# Patient Record
Sex: Female | Born: 1970 | Race: White | Hispanic: Yes | Marital: Single | Smoking: Never smoker
Health system: Southern US, Community
[De-identification: ages and names within clinical notes are randomized; demographics above are authoritative.]

## PROBLEM LIST (undated history)

## (undated) DIAGNOSIS — F329 Major depressive disorder, single episode, unspecified: Secondary | ICD-10-CM

## (undated) DIAGNOSIS — E785 Hyperlipidemia, unspecified: Secondary | ICD-10-CM

## (undated) DIAGNOSIS — K219 Gastro-esophageal reflux disease without esophagitis: Secondary | ICD-10-CM

## (undated) DIAGNOSIS — Z9889 Other specified postprocedural states: Secondary | ICD-10-CM

## (undated) DIAGNOSIS — T4145XA Adverse effect of unspecified anesthetic, initial encounter: Secondary | ICD-10-CM

## (undated) DIAGNOSIS — E079 Disorder of thyroid, unspecified: Secondary | ICD-10-CM

## (undated) DIAGNOSIS — I82409 Acute embolism and thrombosis of unspecified deep veins of unspecified lower extremity: Secondary | ICD-10-CM

## (undated) DIAGNOSIS — E119 Type 2 diabetes mellitus without complications: Secondary | ICD-10-CM

## (undated) DIAGNOSIS — Z992 Dependence on renal dialysis: Secondary | ICD-10-CM

## (undated) DIAGNOSIS — N2581 Secondary hyperparathyroidism of renal origin: Secondary | ICD-10-CM

## (undated) DIAGNOSIS — N186 End stage renal disease: Secondary | ICD-10-CM

## (undated) DIAGNOSIS — R112 Nausea with vomiting, unspecified: Secondary | ICD-10-CM

## (undated) DIAGNOSIS — D638 Anemia in other chronic diseases classified elsewhere: Secondary | ICD-10-CM

## (undated) DIAGNOSIS — I4891 Unspecified atrial fibrillation: Secondary | ICD-10-CM

## (undated) DIAGNOSIS — R569 Unspecified convulsions: Secondary | ICD-10-CM

## (undated) DIAGNOSIS — I10 Essential (primary) hypertension: Secondary | ICD-10-CM

## (undated) DIAGNOSIS — N289 Disorder of kidney and ureter, unspecified: Secondary | ICD-10-CM

## (undated) DIAGNOSIS — F32A Depression, unspecified: Secondary | ICD-10-CM

## (undated) DIAGNOSIS — T8859XA Other complications of anesthesia, initial encounter: Secondary | ICD-10-CM

## (undated) DIAGNOSIS — D649 Anemia, unspecified: Secondary | ICD-10-CM

## (undated) HISTORY — DX: Acute embolism and thrombosis of unspecified deep veins of unspecified lower extremity: I82.409

## (undated) HISTORY — DX: Unspecified atrial fibrillation: I48.91

## (undated) HISTORY — DX: Disorder of thyroid, unspecified: E07.9

## (undated) HISTORY — PX: INSERTION OF DIALYSIS CATHETER: SHX1324

## (undated) HISTORY — DX: Anemia, unspecified: D64.9

## (undated) HISTORY — PX: OTHER SURGICAL HISTORY: SHX169

---

## 2011-11-03 ENCOUNTER — Encounter: Payer: Self-pay | Admitting: Emergency Medicine

## 2011-11-03 ENCOUNTER — Emergency Department (HOSPITAL_COMMUNITY)
Admission: EM | Admit: 2011-11-03 | Discharge: 2011-11-03 | Discharge status: Against Medical Advice | Payer: Self-pay | Attending: Emergency Medicine | Admitting: Emergency Medicine

## 2011-11-03 DIAGNOSIS — M7989 Other specified soft tissue disorders: Secondary | ICD-10-CM | POA: Insufficient documentation

## 2011-11-03 HISTORY — DX: Essential (primary) hypertension: I10

## 2011-11-03 NOTE — ED Notes (Signed)
PT. REPORTS PROGRESSING BILATERAL FEET SWELLING / ABDOMINAL SWELLING WITH FEET PAIN FOR 1 WEEK ,  NO SOB ,DENIES INJURY OR FALL.

## 2013-03-04 ENCOUNTER — Encounter (HOSPITAL_COMMUNITY): Payer: Self-pay | Admitting: Adult Health

## 2013-03-04 ENCOUNTER — Emergency Department (HOSPITAL_COMMUNITY)
Admission: EM | Admit: 2013-03-04 | Discharge: 2013-03-05 | Disposition: A | Discharge status: Home / Self Care | Payer: Self-pay | Attending: Emergency Medicine | Admitting: Emergency Medicine

## 2013-03-04 DIAGNOSIS — E119 Type 2 diabetes mellitus without complications: Secondary | ICD-10-CM | POA: Insufficient documentation

## 2013-03-04 DIAGNOSIS — I120 Hypertensive chronic kidney disease with stage 5 chronic kidney disease or end stage renal disease: Secondary | ICD-10-CM | POA: Insufficient documentation

## 2013-03-04 DIAGNOSIS — N186 End stage renal disease: Secondary | ICD-10-CM | POA: Insufficient documentation

## 2013-03-04 DIAGNOSIS — Z79899 Other long term (current) drug therapy: Secondary | ICD-10-CM | POA: Insufficient documentation

## 2013-03-04 HISTORY — DX: Disorder of kidney and ureter, unspecified: N28.9

## 2013-03-04 LAB — COMPREHENSIVE METABOLIC PANEL
Alkaline Phosphatase: 94 U/L (ref 39–117)
BUN: 29 mg/dL — ABNORMAL HIGH (ref 6–23)
Chloride: 90 mEq/L — ABNORMAL LOW (ref 96–112)
Creatinine, Ser: 6.95 mg/dL — ABNORMAL HIGH (ref 0.50–1.10)
GFR calc Af Amer: 8 mL/min — ABNORMAL LOW (ref >90)
Glucose, Bld: 203 mg/dL — ABNORMAL HIGH (ref 70–99)
Potassium: 4.1 mEq/L (ref 3.5–5.1)
Total Bilirubin: 0.2 mg/dL — ABNORMAL LOW (ref 0.3–1.2)

## 2013-03-04 LAB — CBC WITH DIFFERENTIAL/PLATELET
Eosinophils Absolute: 0.5 10*3/uL (ref 0.0–0.7)
HCT: 31.5 % — ABNORMAL LOW (ref 36.0–46.0)
Hemoglobin: 10.4 g/dL — ABNORMAL LOW (ref 12.0–15.0)
Lymphs Abs: 2 10*3/uL (ref 0.7–4.0)
MCH: 28 pg (ref 26.0–34.0)
MCHC: 33 g/dL (ref 30.0–36.0)
Monocytes Absolute: 1.9 10*3/uL — ABNORMAL HIGH (ref 0.1–1.0)
Monocytes Relative: 12 % (ref 3–12)
Neutro Abs: 10.6 10*3/uL — ABNORMAL HIGH (ref 1.7–7.7)
Neutrophils Relative %: 70 % (ref 43–77)
RBC: 3.72 MIL/uL — ABNORMAL LOW (ref 3.87–5.11)

## 2013-03-04 NOTE — ED Notes (Signed)
RN on phone with pharmacy about delay in insulin being sent.

## 2013-03-04 NOTE — ED Notes (Signed)
Pt is spanish speaking speaking. Daughter at bedside. Pt has no complaints. Daughter is caregiver and reports, "I am concerned because I gave her BP medication, carvedilol 25 mg and she is supposed to have dialysis today but we missed dialysisi due to scheduling errors. She was just released from the hospital in [place] and we just got here at 6 pm today. They scheduled her dialysis for today at 10:30 and meeting her nephrologist, but we were not back from [place] yet. I am concerned because she missed dialysis today and I gavve her BP medication. [facility] told me to come here with her." Dialysis port in upper right chest. This is a new Dx for pt and family and has only been dialyzed 4-5 times in the hospital.

## 2013-03-04 NOTE — ED Notes (Signed)
Per Dr. Effie Shy, have pharmacy send vial of humalog sent from pharmacy for pt. RN placed call to pharmacy.

## 2013-03-05 NOTE — ED Notes (Signed)
Pt's niece provided with insulin teaching and insulin syringes for home. Pt able to demonstrate correctly. Pt has sliding scale from PCP that she will follow at home. Follow up information provided.

## 2013-03-05 NOTE — ED Provider Notes (Signed)
History     CSN: [id number]  Arrival date & time 03/04/13  1839   First MD Initiated Contact with Patient 03/04/13 2108      Chief Complaint  Patient presents with  . dialysis     (Consider location/radiation/quality/duration/timing/severity/associated sxs/prior treatment) HPI Comments: Natalie [name] is a 42 y.o. Female who is here to be evaluated because she missed her dialysis appointment today. She is transferring from [facility], to [place] for dialysis treatment. She was last dialyzed 2 days ago. She is due to followup for a renal physician appointment in 2 days. There's been no fever, chills, nausea, vomiting, weakness, or dizziness. Her niece is with her and acts as her Nurse, learning disability. She has not taken her insulin because she cannot afford the prescription. No other known modifying factors.  The history is provided by the patient.    Past Medical History  Diagnosis Date  . Diabetes mellitus   . Hypertension   . Renal disorder     History reviewed. No pertinent past surgical history.  History reviewed. No pertinent family history.  History  Substance Use Topics  . Smoking status: Never Smoker   . Smokeless tobacco: Not on file  . Alcohol Use: No    OB History   Grav Para Term Preterm Abortions TAB SAB Ect Mult Living                  Review of Systems  All other systems reviewed and are negative.    Allergies  Review of patient's allergies indicates no known allergies.  Home Medications   Current Outpatient Rx  Name  Route  Sig  Dispense  Refill  . amLODipine (NORVASC) 10 MG tablet   Oral   Take 10 mg by mouth daily.         . calcium acetate (PHOSLO) 667 MG capsule   Oral   Take 667 mg by mouth 3 (three) times daily with meals.         . carvedilol (COREG) 25 MG tablet   Oral   Take 25 mg by mouth 2 (two) times daily with a meal.         . docusate sodium (COLACE) 100 MG capsule   Oral   Take 100 mg by mouth daily.         . sodium bicarbonate 650 MG tablet   Oral   Take 1,300 mg by mouth 2 (two) times daily.           BP 163/88  Pulse 66  Temp(Src) 98.4 F (36.9 C) (Oral)  Resp 16  SpO2 98%  Physical Exam  Nursing note and vitals reviewed. Constitutional: She is oriented to person, place, and time. She appears well-developed and well-nourished. No distress.  HENT:  Head: Normocephalic and atraumatic.  Eyes: Conjunctivae and EOM are normal. Pupils are equal, round, and reactive to light.  Neck: Normal range of motion and phonation normal. Neck supple.  Cardiovascular: Normal rate, regular rhythm and intact distal pulses.   Right upper chest dialysis catheter, in place; site appears normal without redness, erythema, or discharge  Pulmonary/Chest: Effort normal and breath sounds normal. She exhibits no tenderness.  Abdominal: Soft. She exhibits no distension. There is no tenderness. There is no guarding.  Musculoskeletal: Normal range of motion.  Neurological: She is alert and oriented to person, place, and time. She has normal strength. She exhibits normal muscle tone.  Skin: Skin is warm and dry.  Psychiatric: Her behavior is normal.  ED Course  Procedures (including critical care time)   Consultation- I discussed the case with Dr. Hyman Hopes; he arranged followup tomorrow (03/05/13) morning at 10:30 AM. At that point, a decision will be made about dialysis, that day, or the next.  A vial of Humalog insulin was obtained to give to the patient  Labs Reviewed  CBC WITH DIFFERENTIAL - Abnormal; Notable for the following:    WBC 15.0 (*)    RBC 3.72 (*)    Hemoglobin 10.4 (*)    HCT 31.5 (*)    RDW 15.6 (*)    Neutro Abs 10.6 (*)    Monocytes Absolute 1.9 (*)    All other components within normal limits  COMPREHENSIVE METABOLIC PANEL - Abnormal; Notable for the following:    Sodium 131 (*)    Chloride 90 (*)    Glucose, Bld 203 (*)    BUN 29 (*)    Creatinine, Ser 6.95 (*)    Albumin  3.3 (*)    Total Bilirubin 0.2 (*)    GFR calc non Af Amer 7 (*)    GFR calc Af Amer 8 (*)    All other components within normal limits      1. End stage renal disease       MDM  End-stage renal disease, with fairly euvolemic status. Potassium normal. Oxygenation normal. There is no indication for urgent dialysis.    Nursing Notes Reviewed/ Care Coordinated, and agree without changes. Applicable Imaging Reviewed.  Interpretation of Laboratory Data incorporated into ED treatment   Plan: Home Medications- usual; Home Treatments- minimal oral fluids; Recommended follow up- Renal at 10:30 AM on 03/05/13      Flint Melter, MD 03/05/13 0020

## 2013-03-11 ENCOUNTER — Other Ambulatory Visit: Payer: Self-pay | Admitting: *Deleted

## 2013-03-11 DIAGNOSIS — N186 End stage renal disease: Secondary | ICD-10-CM

## 2013-03-13 ENCOUNTER — Ambulatory Visit: Payer: Self-pay | Admitting: Vascular Surgery

## 2013-03-17 ENCOUNTER — Ambulatory Visit: Payer: Self-pay | Admitting: Surgery

## 2013-04-18 ENCOUNTER — Encounter: Payer: Self-pay | Admitting: Vascular Surgery

## 2013-04-18 ENCOUNTER — Other Ambulatory Visit: Payer: Self-pay

## 2013-04-18 DIAGNOSIS — N186 End stage renal disease: Secondary | ICD-10-CM

## 2013-04-18 DIAGNOSIS — Z01810 Encounter for preprocedural cardiovascular examination: Secondary | ICD-10-CM

## 2013-05-15 ENCOUNTER — Encounter: Payer: Self-pay | Admitting: Vascular Surgery

## 2013-05-16 ENCOUNTER — Ambulatory Visit (INDEPENDENT_AMBULATORY_CARE_PROVIDER_SITE_OTHER): Payer: Self-pay | Admitting: Vascular Surgery

## 2013-05-16 ENCOUNTER — Encounter: Payer: Self-pay | Admitting: Vascular Surgery

## 2013-05-16 ENCOUNTER — Encounter (INDEPENDENT_AMBULATORY_CARE_PROVIDER_SITE_OTHER): Payer: Self-pay | Admitting: *Deleted

## 2013-05-16 VITALS — BP 161/84 | HR 56 | Wt 121.2 lb

## 2013-05-16 DIAGNOSIS — N186 End stage renal disease: Secondary | ICD-10-CM

## 2013-05-16 DIAGNOSIS — Z992 Dependence on renal dialysis: Secondary | ICD-10-CM | POA: Insufficient documentation

## 2013-05-16 DIAGNOSIS — Z01810 Encounter for preprocedural cardiovascular examination: Secondary | ICD-10-CM

## 2013-05-16 NOTE — Progress Notes (Signed)
[facility] [place] HISTORY AND PHYSICAL   CC: ESRD on HD T TH Sat at [facility] facility Referring Physician: Dr. Ruben Reason  History of Present Illness: Natalie [name] is a 42 y.o. female  Of hispanic origin who has PMH of DM, HTN , a.fib who was referred to [place] for placement of Permanent HD access.   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 Hypertension   . Renal disorder     ESRD  . Diabetes mellitus     Type II  . Anemia   . Thyroid disease     Hyperpara-thyroidism  . Atrial fibrillation   . DVT (deep venous thrombosis)    History reviewed. No pertinent past surgical history.   Social History History  Substance Use Topics  . Smoking status: Never Smoker   . Smokeless tobacco: Never Used  . Alcohol Use: No    Family History Family History  Problem Relation Age of Onset  . Heart attack Mother   . Diabetes Sister     No Known Allergies  Current Outpatient Prescriptions  Medication Sig Dispense Refill  . amLODipine (NORVASC) 10 MG tablet Take 10 mg by mouth daily.      . calcium acetate (PHOSLO) 667 MG capsule Take 667 mg by mouth 3 (three) times daily with meals.      . carvedilol (COREG) 25 MG tablet Take 25 mg by mouth 2 (two) times daily with a meal.      . docusate sodium (COLACE) 100 MG capsule Take 100 mg by mouth daily.      . sodium bicarbonate 650 MG tablet Take 1,300 mg by mouth 2 (two) times daily.       No current facility-administered medications for this visit.   ROS: [x]  Positive   [ ]  Denies    General: [ ]  Weight loss, [ ]  Fever, [ ]  chills Neurologic: [ ]  Dizziness, [ ]  Blackouts, [ ]  Seizure [ ]  Stroke, [ ]  "Mini stroke", [ ]  Slurred speech, [ ]  Temporary blindness; [ ]  weakness in arms or legs, [ ]  Hoarseness Cardiac: [ ]  Chest pain/pressure, [ ]  Shortness of breath at rest [ x] Shortness of breath with exertion, [ x] Atrial fibrillation or irregular  heartbeat Vascular: [ ]  Pain in legs with walking, [ ]  Pain in legs at rest, [ ]  Pain in legs at night,  [ ]  Non-healing ulcer, [ ]  Blood clot in vein/DVT,   Pulmonary: [ ]  Home oxygen, [ ]  Productive cough, [ ]  Coughing up blood, [ ]  Asthma,  [ ]  Wheezing Musculoskeletal:  [ ]  Arthritis, [ ]  Low back pain, [ ]  Joint pain Hematologic: [ ]  Easy Bruising, [ ]  Anemia; [ ]  Hepatitis Gastrointestinal: [ ]  Blood in stool, [ ]  Gastroesophageal Reflux/heartburn, [ ]  Trouble swallowing Urinary: [x ] chronic Kidney disease, [ x] on HD - [ ]  MWF or [x ] TTHS, [ ]  Burning with urination, [ ]  Difficulty urinating Skin: [ ]  Rashes, [ ]  Wounds Psychological: [ ]  Anxiety, [ ]  Depression  Physical Examination  Filed Vitals:   05/16/13 1549  BP: 161/84  Pulse: 56    There is no height on file to calculate BMI.  General:  WDWN in NAD Gait: Normal HENT: WNL Eyes: Pupils equal Pulmonary: normal non-labored breathing , without Rales, rhonchi,  wheezing Cardiac: RRR, without  Murmurs, rubs or gallops; Abdomen: soft,  Skin:  no rashes, ulcers noted Vascular Exam/Pulses: 2+ radila pulses palp bilat Extremities without ischemic changes, no Gangrene , no cellulitis; no open wounds;  Musculoskeletal: no muscle wasting or atrophy  Neurologic: A&O X 3; Appropriate Affect ; SENSATION: normal; MOTOR FUNCTION:  moving all extremities equally. Speech is fluent/normal Psych: judgment intact, mood and affect appropriate for clinical situation Lymph: no LAD in inguinal or axillary basin  Non-Invasive Vascular Imaging: 05/16/2013 Vein mapping shows adequate vein in right cephalic, bilat basilic veins  ASSESSMENT: Natalie [name] is a 42 y.o. female Who is on HD and needs permanent access All history obtained and questions answered through interpreter PLAN: AVF - right radiocephalic  Addendum  I have independently interviewed and examined the patient, and I agree with the physician assistant's findings.  I  discussed that her best first fistula option would be a R RC AVF.  She has a strong radial pulse and visible cephalic vein.  Risk, benefits, and alternatives to access surgery were discussed.  The patient is aware the risks include but are not limited to: bleeding, infection, steal syndrome, nerve damage, ischemic monomelic neuropathy, failure to mature, need for additional procedures, death and stroke.  The patient agrees to proceed forward with the procedure.  This is scheduled for 28 JUL 14.   Leonides Sake, MD [facility] [place] Office: [phone] Pager: [phone]  05/16/2013, 5:16 PM

## 2013-05-19 ENCOUNTER — Other Ambulatory Visit: Payer: Self-pay

## 2013-05-23 NOTE — Progress Notes (Signed)
Unable to contact pt. According to pt sister pt will only be available on Saturday because she is at work. Pre-op instructions were left on voicemail by interpreter.

## 2013-05-23 NOTE — Progress Notes (Addendum)
Message left on pt voicemail  regarding a new arrival time of 5:30AM on Monday, May 26, 2013. Interpreter # [phone] left the message. A message was left on  Lawanna's voicemail ( # [phone])  to change interpreter schedule to arrive at 5:30 AM as well.Spoke with Tonia Ghent, she suggests that staff uses the language line until the interpreter that was originally scheduled arrives because there will be no interpreters available at 5:30 AM.

## 2013-05-25 MED ORDER — DEXTROSE 5 % IV SOLN
1.5000 g | INTRAVENOUS | Status: AC
  Administered 2013-05-26: 1.5 g via INTRAVENOUS
  Filled 2013-05-25 (×2): qty 1.5

## 2013-05-26 ENCOUNTER — Encounter (HOSPITAL_COMMUNITY)
Admission: RE | Disposition: A | Discharge status: Home / Self Care | Payer: Self-pay | Source: Ambulatory Visit | Attending: Vascular Surgery

## 2013-05-26 ENCOUNTER — Ambulatory Visit (HOSPITAL_COMMUNITY): Payer: Self-pay

## 2013-05-26 ENCOUNTER — Encounter (HOSPITAL_COMMUNITY): Payer: Self-pay | Admitting: Critical Care Medicine

## 2013-05-26 ENCOUNTER — Ambulatory Visit (HOSPITAL_COMMUNITY)
Admission: RE | Admit: 2013-05-26 | Discharge: 2013-05-26 | Disposition: A | Discharge status: Home / Self Care | Payer: MEDICAID | Source: Ambulatory Visit | Attending: Vascular Surgery | Admitting: Vascular Surgery

## 2013-05-26 ENCOUNTER — Telehealth: Payer: Self-pay | Admitting: Vascular Surgery

## 2013-05-26 ENCOUNTER — Ambulatory Visit (HOSPITAL_COMMUNITY): Payer: Self-pay | Admitting: Critical Care Medicine

## 2013-05-26 DIAGNOSIS — Z79899 Other long term (current) drug therapy: Secondary | ICD-10-CM | POA: Insufficient documentation

## 2013-05-26 DIAGNOSIS — I120 Hypertensive chronic kidney disease with stage 5 chronic kidney disease or end stage renal disease: Secondary | ICD-10-CM | POA: Insufficient documentation

## 2013-05-26 DIAGNOSIS — E119 Type 2 diabetes mellitus without complications: Secondary | ICD-10-CM | POA: Insufficient documentation

## 2013-05-26 DIAGNOSIS — N186 End stage renal disease: Secondary | ICD-10-CM

## 2013-05-26 DIAGNOSIS — E213 Hyperparathyroidism, unspecified: Secondary | ICD-10-CM | POA: Insufficient documentation

## 2013-05-26 DIAGNOSIS — I4891 Unspecified atrial fibrillation: Secondary | ICD-10-CM | POA: Insufficient documentation

## 2013-05-26 DIAGNOSIS — Z794 Long term (current) use of insulin: Secondary | ICD-10-CM | POA: Insufficient documentation

## 2013-05-26 DIAGNOSIS — D649 Anemia, unspecified: Secondary | ICD-10-CM | POA: Insufficient documentation

## 2013-05-26 DIAGNOSIS — G5690 Unspecified mononeuropathy of unspecified upper limb: Secondary | ICD-10-CM | POA: Insufficient documentation

## 2013-05-26 DIAGNOSIS — Z86718 Personal history of other venous thrombosis and embolism: Secondary | ICD-10-CM | POA: Insufficient documentation

## 2013-05-26 HISTORY — PX: AV FISTULA PLACEMENT: SHX1204

## 2013-05-26 LAB — POCT I-STAT 4, (NA,K, GLUC, HGB,HCT)
Hemoglobin: 13.6 g/dL (ref 12.0–15.0)
Potassium: 3.7 mEq/L (ref 3.5–5.1)
Sodium: 136 mEq/L (ref 135–145)

## 2013-05-26 SURGERY — ARTERIOVENOUS (AV) FISTULA CREATION
Anesthesia: General | Site: Arm Upper | Laterality: Right | Wound class: Clean

## 2013-05-26 MED ORDER — OXYCODONE HCL 5 MG PO TABS: 5.0000 mg | ORAL_TABLET | Freq: Once | ORAL | Status: AC | PRN

## 2013-05-26 MED ORDER — CARVEDILOL 12.5 MG PO TABS
ORAL_TABLET | ORAL | Status: AC
  Filled 2013-05-26: qty 2

## 2013-05-26 MED ORDER — SODIUM CHLORIDE 0.9 % IV SOLN
INTRAVENOUS | Status: DC | PRN
  Administered 2013-05-26: 07:00:00 via INTRAVENOUS

## 2013-05-26 MED ORDER — PHENYLEPHRINE HCL 10 MG/ML IJ SOLN
INTRAMUSCULAR | Status: DC | PRN
  Administered 2013-05-26: 80 ug via INTRAVENOUS

## 2013-05-26 MED ORDER — OXYCODONE HCL 5 MG/5ML PO SOLN: 5.0000 mg | Freq: Once | ORAL | Status: AC | PRN

## 2013-05-26 MED ORDER — THROMBIN 20000 UNITS EX SOLR
CUTANEOUS | Status: AC
  Filled 2013-05-26: qty 20000

## 2013-05-26 MED ORDER — LIDOCAINE HCL (CARDIAC) 20 MG/ML IV SOLN
INTRAVENOUS | Status: DC | PRN
  Administered 2013-05-26: 40 mg via INTRAVENOUS

## 2013-05-26 MED ORDER — SODIUM CHLORIDE 0.9 % IR SOLN
Status: DC | PRN
  Administered 2013-05-26: 08:00:00

## 2013-05-26 MED ORDER — PROPOFOL 10 MG/ML IV BOLUS
INTRAVENOUS | Status: DC | PRN
  Administered 2013-05-26: 50 mg via INTRAVENOUS
  Administered 2013-05-26: 150 mg via INTRAVENOUS

## 2013-05-26 MED ORDER — METOCLOPRAMIDE HCL 5 MG/ML IJ SOLN: 10.0000 mg | Freq: Once | INTRAMUSCULAR | Status: DC | PRN

## 2013-05-26 MED ORDER — OXYCODONE HCL 5 MG PO TABS
ORAL_TABLET | ORAL | Status: AC
  Administered 2013-05-26: 5 mg via ORAL
  Filled 2013-05-26: qty 1

## 2013-05-26 MED ORDER — MIDAZOLAM HCL 5 MG/5ML IJ SOLN
INTRAMUSCULAR | Status: DC | PRN
  Administered 2013-05-26: 1 mg via INTRAVENOUS

## 2013-05-26 MED ORDER — ONDANSETRON HCL 4 MG/2ML IJ SOLN
INTRAMUSCULAR | Status: DC | PRN
  Administered 2013-05-26: 4 mg via INTRAVENOUS

## 2013-05-26 MED ORDER — FENTANYL CITRATE 0.05 MG/ML IJ SOLN
25.0000 ug | INTRAMUSCULAR | Status: DC | PRN
  Administered 2013-05-26: 25 ug via INTRAVENOUS

## 2013-05-26 MED ORDER — FENTANYL CITRATE 0.05 MG/ML IJ SOLN
INTRAMUSCULAR | Status: DC | PRN
  Administered 2013-05-26 (×4): 25 ug via INTRAVENOUS

## 2013-05-26 MED ORDER — OXYCODONE HCL 5 MG PO TABS: 5.0000 mg | ORAL_TABLET | ORAL | Status: DC | PRN

## 2013-05-26 MED ORDER — FENTANYL CITRATE 0.05 MG/ML IJ SOLN
INTRAMUSCULAR | Status: AC
  Administered 2013-05-26: 25 ug via INTRAVENOUS
  Filled 2013-05-26: qty 2

## 2013-05-26 MED ORDER — SODIUM CHLORIDE 0.9 % IV SOLN: INTRAVENOUS | Status: DC

## 2013-05-26 MED ORDER — 0.9 % SODIUM CHLORIDE (POUR BTL) OPTIME
TOPICAL | Status: DC | PRN
  Administered 2013-05-26: 1000 mL

## 2013-05-26 SURGICAL SUPPLY — 39 items
ARMBAND PINK RESTRICT EXTREMIT (MISCELLANEOUS) ×2 IMPLANT
CANISTER SUCTION 2500CC (MISCELLANEOUS) ×2 IMPLANT
CLIP TI MEDIUM 6 (CLIP) ×2 IMPLANT
CLIP TI WIDE RED SMALL 6 (CLIP) ×2 IMPLANT
CLOTH BEACON ORANGE TIMEOUT ST (SAFETY) ×2 IMPLANT
COVER PROBE W GEL 5X96 (DRAPES) ×2 IMPLANT
COVER SURGICAL LIGHT HANDLE (MISCELLANEOUS) ×2 IMPLANT
DECANTER SPIKE VIAL GLASS SM (MISCELLANEOUS) IMPLANT
DERMABOND ADVANCED (GAUZE/BANDAGES/DRESSINGS) ×1
DERMABOND ADVANCED .7 DNX12 (GAUZE/BANDAGES/DRESSINGS) ×1 IMPLANT
ELECT REM PT RETURN 9FT ADLT (ELECTROSURGICAL) ×2
ELECTRODE REM PT RTRN 9FT ADLT (ELECTROSURGICAL) ×1 IMPLANT
GLOVE BIO SURGEON STRL SZ7 (GLOVE) ×2 IMPLANT
GLOVE BIOGEL PI IND STRL 6.5 (GLOVE) ×2 IMPLANT
GLOVE BIOGEL PI IND STRL 7.0 (GLOVE) ×2 IMPLANT
GLOVE BIOGEL PI IND STRL 7.5 (GLOVE) ×1 IMPLANT
GLOVE BIOGEL PI INDICATOR 6.5 (GLOVE) ×2
GLOVE BIOGEL PI INDICATOR 7.0 (GLOVE) ×2
GLOVE BIOGEL PI INDICATOR 7.5 (GLOVE) ×1
GLOVE ECLIPSE 6.5 STRL STRAW (GLOVE) ×6 IMPLANT
GLOVE SURG SS PI 6.0 STRL IVOR (GLOVE) ×2 IMPLANT
GOWN PREVENTION PLUS XXLARGE (GOWN DISPOSABLE) ×4 IMPLANT
GOWN STRL NON-REIN LRG LVL3 (GOWN DISPOSABLE) ×4 IMPLANT
KIT BASIN OR (CUSTOM PROCEDURE TRAY) ×2 IMPLANT
KIT ROOM TURNOVER OR (KITS) ×2 IMPLANT
NEEDLE HYPO 25GX1X1/2 BEV (NEEDLE) ×2 IMPLANT
NS IRRIG 1000ML POUR BTL (IV SOLUTION) ×2 IMPLANT
PACK CV ACCESS (CUSTOM PROCEDURE TRAY) ×2 IMPLANT
PAD ARMBOARD 7.5X6 YLW CONV (MISCELLANEOUS) ×4 IMPLANT
SPONGE SURGIFOAM ABS GEL 100 (HEMOSTASIS) IMPLANT
SUT MNCRL AB 4-0 PS2 18 (SUTURE) ×2 IMPLANT
SUT PROLENE 6 0 BV (SUTURE) ×2 IMPLANT
SUT PROLENE 7 0 BV 1 (SUTURE) ×2 IMPLANT
SUT VIC AB 3-0 SH 27 (SUTURE) ×2
SUT VIC AB 3-0 SH 27X BRD (SUTURE) ×2 IMPLANT
TOWEL OR 17X24 6PK STRL BLUE (TOWEL DISPOSABLE) ×2 IMPLANT
TOWEL OR 17X26 10 PK STRL BLUE (TOWEL DISPOSABLE) ×2 IMPLANT
UNDERPAD 30X30 INCONTINENT (UNDERPADS AND DIAPERS) ×2 IMPLANT
WATER STERILE IRR 1000ML POUR (IV SOLUTION) ×2 IMPLANT

## 2013-05-26 NOTE — Op Note (Signed)
OPERATIVE NOTE   PROCEDURE: 1. Right brachiocephalic arteriovenous fistula placement 2. Right wrist exploration  PRE-OPERATIVE DIAGNOSIS: end stage renal disease   POST-OPERATIVE DIAGNOSIS: same as above   SURGEON: Leonides Sake, MD  ANESTHESIA: general  ESTIMATED BLOOD LOSS: 50 cc  FINDING(S): 1.  Radial artery only 1.5-2.0 mm 2.  Cephalic vein: 3.0 mm 3.  Brachial artery 2.5-3.0 mm 4.  Weakly palpable thrill with dopplerable radial signal  SPECIMEN(S):  none  INDICATIONS:   Natalie [name] is a 42 y.o. female who presents with end stage renal disease.  The patient is scheduled for right radiocephalic arteriovenous fistula placement.  The patient is aware the risks include but are not limited to: bleeding, infection, steal syndrome, nerve damage, ischemic monomelic neuropathy, failure to mature, and need for additional procedures.  The patient is aware of the risks of the procedure and elects to proceed forward.  DESCRIPTION: After full informed written consent was obtained from the patient, the patient was brought back to the operating room and placed supine upon the operating table.  Prior to induction, the patient received IV antibiotics.   After obtaining adequate anesthesia, the patient was then prepped and draped in the standard fashion for a right arm access procedure.  I turned my attention first to identifying the patient's cephalic vein and radial artery.  Using SonoSite guidance, the location of these vessels were marked out on the skin.   I made a transverse incision and dissected out the radial artery.  Unfortunately, the artery appeared to be only 1.5-2.0 mm in diameter, too small for use for access purposes.  I closed this incision with a running stitch of 3-0 Vicryl in the subcutaneous tissue.  The skin was closed later in the case with a running subcuticular of 4-0 Monocryl.  The skin was cleaned, dried, and reinforced with Dermabond.  I turned my attention to the  antecubitum.  Under Sonosite guidance, I marked out the cephalic vein and brachial artery. I made a transverse incision at the level of the antecubitum and dissected through the subcutaneous tissue and fascia to gain exposure of the brachial artery.  This was noted to be 2.5-3.0 mm in diameter externally.  This was dissected out proximally and distally and controlled with vessel loops .  I then dissected out the cephalic vein.  This was noted to be 2.0-2.5 mm in diameter externally.  The distal segment of the vein was ligated with a  2-0 silk, and the vein was transected.  The proximal segment was iinterrogated with serial dilators.  The vein accepted up to a 3.0 mm dilator without any difficulty.  I then instilled the heparinized saline into the vein and clamped it.  At this point, I reset my exposure of the brachial artery and placed the artery under tension proximally and distally.  I made an arteriotomy with a #11 blade, and then I extended the arteriotomy with a Potts scissor.  I injected heparinized saline proximal and distal to this arteriotomy.  The vein was then sewn to the artery in an end-to-side configuration with a running stitch of 7-0 Prolene.  Prior to completing this anastomosis, I allowed the vein and artery to backbleed.  There was no evidence of clot from any vessels.  I completed the anastomosis in the usual fashion and then released all vessel loops and clamps.  There was a weakly palpable thrill in the venous outflow, and there was a dopplerable radial signal.  At this point, I irrigated out  the surgical wound.  There was no further active bleeding.  The subcutaneous tissue was reapproximated with a running stitch of 3-0 Vicryl.  The skin was then reapproximated with a running subcuticular stitch of 4-0 Vicryl.  The skin was then cleaned, dried, and reinforced with Dermabond.  The patient tolerated this procedure well.   COMPLICATIONS: none  CONDITION: stable  Leonides Sake, MD [facility] [place] Office: [phone] Pager: [phone]  05/26/2013, 9:07 AM

## 2013-05-26 NOTE — Preoperative (Addendum)
Beta Blockers   Reason not to administer Beta Blockers:Not Applicable 

## 2013-05-26 NOTE — Anesthesia Postprocedure Evaluation (Signed)
Anesthesia Post Note  Patient: Natalie [name]  Procedure(s) Performed: Procedure(s) (LRB): Right Brachiocephalic  ARTERIOVENOUS (AV) FISTULA CREATION (Right)  Anesthesia type: General  Patient location: PACU  Post pain: Pain level controlled  Post assessment: Patient's Cardiovascular Status Stable  Last Vitals:  Filed Vitals:   05/26/13 1030  BP: 155/89  Pulse: 65  Temp: 36.2 C  Resp: 15    Post vital signs: Reviewed and stable  Level of consciousness: alert  Complications: No apparent anesthesia complications

## 2013-05-26 NOTE — Interval H&P Note (Signed)
[facility] [place]  History and Physical Update  The patient was interviewed and re-examined.  The patient's previous History and Physical has been reviewed and is unchanged.  There is no change in the plan of care: R RC AVF.  Leonides Sake, MD [facility] [place] Office: [phone] Pager: [phone]  05/26/2013, 7:07 AM

## 2013-05-26 NOTE — Anesthesia Preprocedure Evaluation (Addendum)
Anesthesia Evaluation  Patient identified by MRN, date of birth, ID band Patient awake    Reviewed: Allergy & Precautions, H&P , NPO status , Patient's Chart, lab work & pertinent test results, reviewed documented beta blocker date and time   Airway Mallampati: II TM Distance: >3 FB Neck ROM: full    Dental  (+) Dental Advisory Given   Pulmonary neg pulmonary ROS,  breath sounds clear to auscultation        Cardiovascular hypertension, Pt. on medications + dysrhythmias Atrial Fibrillation Rhythm:regular     Neuro/Psych negative neurological ROS  negative psych ROS   GI/Hepatic negative GI ROS, Neg liver ROS,   Endo/Other  diabetes, Insulin Dependent  Renal/GU ESRFRenal disease  negative genitourinary   Musculoskeletal   Abdominal   Peds  Hematology  (+) anemia ,   Anesthesia Other Findings See surgeon's H&P   Reproductive/Obstetrics negative OB ROS                          Anesthesia Physical Anesthesia Plan  ASA: III  Anesthesia Plan: General   Post-op Pain Management:    Induction: Intravenous  Airway Management Planned: LMA  Additional Equipment:   Intra-op Plan:   Post-operative Plan:   Informed Consent: I have reviewed the patients History and Physical, chart, labs and discussed the procedure including the risks, benefits and alternatives for the proposed anesthesia with the patient or authorized representative who has indicated his/her understanding and acceptance.   Dental advisory given  Plan Discussed with: Anesthesiologist and Surgeon  Anesthesia Plan Comments:        Anesthesia Quick Evaluation

## 2013-05-26 NOTE — Transfer of Care (Signed)
Immediate Anesthesia Transfer of Care Note  Patient: Natalie [name]  Procedure(s) Performed: Procedure(s): Right Brachiocephalic  ARTERIOVENOUS (AV) FISTULA CREATION (Right)  Patient Location: PACU  Anesthesia Type:General  Level of Consciousness: sedated  Airway & Oxygen Therapy: Patient Spontanous Breathing and Patient connected to nasal cannula oxygen  Post-op Assessment: Report given to PACU RN and Post -op Vital signs reviewed and stable  Post vital signs: Reviewed and stable  Complications: No apparent anesthesia complications

## 2013-05-26 NOTE — Telephone Encounter (Addendum)
Message copied by Fredrich Birks on Mon May 26, 2013  1:03 PM ------      Message from: Melene Plan      Created: Mon May 26, 2013  9:55 AM                   ----- Message -----         From: Marlowe Shores, PA-C         Sent: 05/26/2013   8:21 AM           To: Melene Plan, RN, [facility] Admin Pool            4-6 week f/u Dr. Imogene Burn AVF -  ------  Scheduled 07/04/2013 @ 10am  Called home #, the person answering did not speak English, unable to inform of appt by phone. Mailed letter to home address, dpm

## 2013-05-26 NOTE — H&P (View-Only) (Signed)
[facility] [place] HISTORY AND PHYSICAL   CC: ESRD on HD T TH Sat at [facility] facility Referring Physician: Dr. K Goldsborough  History of Present Illness: Natalie [name] is a 42 y.o. female  Of hispanic origin who has PMH of DM, HTN , a.fib who was referred to us for placement of Permanent HD access.   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 Hypertension   . Renal disorder     ESRD  . Diabetes mellitus     Type II  . Anemia   . Thyroid disease     Hyperpara-thyroidism  . Atrial fibrillation   . DVT (deep venous thrombosis)    History reviewed. No pertinent past surgical history.   Social History History  Substance Use Topics  . Smoking status: Never Smoker   . Smokeless tobacco: Never Used  . Alcohol Use: No    Family History Family History  Problem Relation Age of Onset  . Heart attack Mother   . Diabetes Sister     No Known Allergies  Current Outpatient Prescriptions  Medication Sig Dispense Refill  . amLODipine (NORVASC) 10 MG tablet Take 10 mg by mouth daily.      . calcium acetate (PHOSLO) 667 MG capsule Take 667 mg by mouth 3 (three) times daily with meals.      . carvedilol (COREG) 25 MG tablet Take 25 mg by mouth 2 (two) times daily with a meal.      . docusate sodium (COLACE) 100 MG capsule Take 100 mg by mouth daily.      . sodium bicarbonate 650 MG tablet Take 1,300 mg by mouth 2 (two) times daily.       No current facility-administered medications for this visit.   ROS: [x] Positive   [ ] Denies    General: [ ] Weight loss, [ ] Fever, [ ] chills Neurologic: [ ] Dizziness, [ ] Blackouts, [ ] Seizure [ ] Stroke, [ ] "Mini stroke", [ ] Slurred speech, [ ] Temporary blindness; [ ] weakness in arms or legs, [ ] Hoarseness Cardiac: [ ] Chest pain/pressure, [ ] Shortness of breath at rest [ x] Shortness of breath with exertion, [ x] Atrial fibrillation or irregular  heartbeat Vascular: [ ] Pain in legs with walking, [ ] Pain in legs at rest, [ ] Pain in legs at night,  [ ] Non-healing ulcer, [ ] Blood clot in vein/DVT,   Pulmonary: [ ] Home oxygen, [ ] Productive cough, [ ] Coughing up blood, [ ] Asthma,  [ ] Wheezing Musculoskeletal:  [ ] Arthritis, [ ] Low back pain, [ ] Joint pain Hematologic: [ ] Easy Bruising, [ ] Anemia; [ ] Hepatitis Gastrointestinal: [ ] Blood in stool, [ ] Gastroesophageal Reflux/heartburn, [ ] Trouble swallowing Urinary: [x ] chronic Kidney disease, [ x] on HD - [ ] MWF or [x ] TTHS, [ ] Burning with urination, [ ] Difficulty urinating Skin: [ ] Rashes, [ ] Wounds Psychological: [ ] Anxiety, [ ] Depression  Physical Examination  Filed Vitals:   05/16/13 1549  BP: 161/84  Pulse: 56    There is no height on file to calculate BMI.  General:  WDWN in NAD Gait: Normal HENT: WNL Eyes: Pupils equal Pulmonary: normal non-labored breathing , without Rales, rhonchi,  wheezing Cardiac: RRR, without  Murmurs, rubs or gallops; Abdomen: soft,  Skin:   no rashes, ulcers noted Vascular Exam/Pulses: 2+ radila pulses palp bilat Extremities without ischemic changes, no Gangrene , no cellulitis; no open wounds;  Musculoskeletal: no muscle wasting or atrophy  Neurologic: A&O X 3; Appropriate Affect ; SENSATION: normal; MOTOR FUNCTION:  moving all extremities equally. Speech is fluent/normal Psych: judgment intact, mood and affect appropriate for clinical situation Lymph: no LAD in inguinal or axillary basin  Non-Invasive Vascular Imaging: 05/16/2013 Vein mapping shows adequate vein in right cephalic, bilat basilic veins  ASSESSMENT: Natalie [name] is a 42 y.o. female Who is on HD and needs permanent access All history obtained and questions answered through interpreter PLAN: AVF - right radiocephalic  Addendum  I have independently interviewed and examined the patient, and I agree with the physician assistant's findings.  I  discussed that her best first fistula option would be a R RC AVF.  She has a strong radial pulse and visible cephalic vein.  Risk, benefits, and alternatives to access surgery were discussed.  The patient is aware the risks include but are not limited to: bleeding, infection, steal syndrome, nerve damage, ischemic monomelic neuropathy, failure to mature, need for additional procedures, death and stroke.  The patient agrees to proceed forward with the procedure.  This is scheduled for 28 JUL 14.   Brian Chen, MD [facility] [place] Office: [phone] Pager: [phone]  05/16/2013, 5:16 PM   

## 2013-05-26 NOTE — Anesthesia Procedure Notes (Signed)
Procedure Name: LMA Insertion Date/Time: 05/26/2013 7:33 AM Performed by: Elon Alas Pre-anesthesia Checklist: Patient identified, Timeout performed, Emergency Drugs available, Suction available and Patient being monitored Patient Re-evaluated:Patient Re-evaluated prior to inductionOxygen Delivery Method: Circle system utilized Preoxygenation: Pre-oxygenation with 100% oxygen Intubation Type: IV induction Ventilation: Mask ventilation without difficulty LMA: LMA inserted LMA Size: 4.0 Number of attempts: 1 Placement Confirmation: positive ETCO2,  breath sounds checked- equal and bilateral and ETT inserted through vocal cords under direct vision Tube secured with: Tape Dental Injury: Teeth and Oropharynx as per pre-operative assessment

## 2013-05-27 ENCOUNTER — Encounter (HOSPITAL_COMMUNITY): Payer: Self-pay | Admitting: Vascular Surgery

## 2013-06-18 ENCOUNTER — Encounter: Payer: Self-pay | Admitting: Nephrology

## 2013-06-19 ENCOUNTER — Encounter: Payer: Self-pay | Admitting: Vascular Surgery

## 2013-06-20 ENCOUNTER — Ambulatory Visit (INDEPENDENT_AMBULATORY_CARE_PROVIDER_SITE_OTHER): Payer: Self-pay | Admitting: Vascular Surgery

## 2013-06-20 ENCOUNTER — Encounter: Payer: Self-pay | Admitting: Vascular Surgery

## 2013-06-20 VITALS — BP 142/77 | HR 62 | Temp 97.7°F | Ht <= 58 in | Wt 121.2 lb

## 2013-06-20 DIAGNOSIS — T82898A Other specified complication of vascular prosthetic devices, implants and grafts, initial encounter: Secondary | ICD-10-CM | POA: Insufficient documentation

## 2013-06-20 DIAGNOSIS — N186 End stage renal disease: Secondary | ICD-10-CM

## 2013-06-20 NOTE — Progress Notes (Signed)
[facility] [place]  Postoperative Access Visit  History of Present Illness  Natalie [name] is a 42 y.o. year old female who presents for postoperative follow-up for: R BC AVF (Date: 05/26/13).  The patient's wounds are healing.  The patient notes no steal symptoms.  The patient is able to complete their activities of daily living.  The patient's current symptoms are: some drainage from L antecubital incison.  For VQI Use Only  PRE-ADM LIVING: Home  AMB STATUS: Ambulatory  Physical Examination Filed Vitals:   06/20/13 0915  BP: 142/77  Pulse: 62  Temp: 97.7 F (36.5 C)    LUE: wrist incision is healed, antecubital incision is superficially separated with serous drainage, skin feels warm, hand grip is 5/5, sensation in digits is intact, palpable thrill, bruit can be auscultated , on sonosite: fistula > 5 mm  Medical Decision Making  Natalie [name] is a 42 y.o. year old female who presents s/p L BC AVF.  The pt was instructed in regards to wound care through her son: soap and water BID with neosporin and sterile bandage on top.  I suspect this fistula will likely be ready in another 4 weeks.  She will follow up for further evaluation in 4 weeks.  Thank you for allowing [place] to participate in this patient's care.  Leonides Sake, MD [facility] [place] Office: [phone] Pager: [phone]  06/20/2013, 3:27 PM

## 2013-07-04 ENCOUNTER — Ambulatory Visit: Payer: Self-pay | Admitting: Vascular Surgery

## 2013-07-17 ENCOUNTER — Encounter: Payer: Self-pay | Admitting: Vascular Surgery

## 2013-07-18 ENCOUNTER — Encounter (INDEPENDENT_AMBULATORY_CARE_PROVIDER_SITE_OTHER): Payer: Self-pay | Admitting: Vascular Surgery

## 2013-07-18 ENCOUNTER — Ambulatory Visit (INDEPENDENT_AMBULATORY_CARE_PROVIDER_SITE_OTHER): Payer: Self-pay | Admitting: Vascular Surgery

## 2013-07-18 ENCOUNTER — Encounter: Payer: Self-pay | Admitting: Vascular Surgery

## 2013-07-18 VITALS — BP 189/91 | HR 61 | Resp 16 | Ht <= 58 in | Wt 120.0 lb

## 2013-07-18 DIAGNOSIS — Z4931 Encounter for adequacy testing for hemodialysis: Secondary | ICD-10-CM

## 2013-07-18 DIAGNOSIS — N186 End stage renal disease: Secondary | ICD-10-CM

## 2013-07-18 DIAGNOSIS — Z48812 Encounter for surgical aftercare following surgery on the circulatory system: Secondary | ICD-10-CM

## 2013-07-18 NOTE — Progress Notes (Signed)
[facility] [place]  Postoperative Access Visit  History of Present Illness  Natalie [name] is a 42 y.o. year old female female who presents for postoperative follow-up for: R BC AVF (Date: 05/26/13). The patient's wounds are healed.  . The patient notes no steal symptoms. The patient is able to complete their activities of daily living. The patient's current symptoms are: none.  For VQI Use Only  PRE-ADM LIVING: Home   AMB STATUS: Ambulatory   Physical Examination  Filed Vitals:   07/18/13 1230  BP: 189/91  Pulse: 61  Resp: 16  Height: 4\' 10"  (1.473 m)  Weight: 120 lb (54.432 kg)  SpO2: 99%    RUE: wrist incision is healed, antecubital incision is healed  R arm access duplex (07/18/2013)  Tapered distal fistula: 2.4-3.6 mm  Mid-segment 6.4-6.5 mm  Some side branches: 1.8 mm, 2.9 mm  Medical Decision Making   Natalie [name] is a 42 y.o. year old female who presents s/p R BC AVF.  The fistula is tapered distally, which I prefer to decrease the risk of steal.  The mid-segment appears mature so I think it is ready for attempt at access.  If the fistula fails to be usable, ligation of side branches can be considered.    The patient's tunneled dialysis catheter can be removed after two successful cannulations and completed dialysis treatments.  Thank you for allowing [place] to participate in this patient's care.  Leonides Sake, MD [facility] [place] Office: [phone] Pager: [phone]  07/18/2013, 12:49 PM

## 2013-10-01 ENCOUNTER — Other Ambulatory Visit: Payer: Self-pay | Admitting: Vascular Surgery

## 2013-10-01 DIAGNOSIS — T82898A Other specified complication of vascular prosthetic devices, implants and grafts, initial encounter: Secondary | ICD-10-CM

## 2013-10-06 ENCOUNTER — Encounter: Payer: Self-pay | Admitting: Vascular Surgery

## 2013-10-22 ENCOUNTER — Encounter: Payer: Self-pay | Admitting: Vascular Surgery

## 2013-10-24 ENCOUNTER — Ambulatory Visit (HOSPITAL_COMMUNITY)
Admission: RE | Admit: 2013-10-24 | Discharge: 2013-10-24 | Disposition: A | Discharge status: Home / Self Care | Payer: Self-pay | Source: Ambulatory Visit | Attending: Vascular Surgery | Admitting: Vascular Surgery

## 2013-10-24 ENCOUNTER — Ambulatory Visit (INDEPENDENT_AMBULATORY_CARE_PROVIDER_SITE_OTHER): Payer: Self-pay | Admitting: Vascular Surgery

## 2013-10-24 ENCOUNTER — Encounter: Payer: Self-pay | Admitting: Vascular Surgery

## 2013-10-24 ENCOUNTER — Other Ambulatory Visit (HOSPITAL_COMMUNITY): Payer: Self-pay

## 2013-10-24 ENCOUNTER — Ambulatory Visit: Payer: Self-pay | Admitting: Vascular Surgery

## 2013-10-24 VITALS — BP 176/77 | HR 66 | Temp 97.6°F | Resp 16 | Ht 59.0 in | Wt 128.0 lb

## 2013-10-24 DIAGNOSIS — T82598A Other mechanical complication of other cardiac and vascular devices and implants, initial encounter: Secondary | ICD-10-CM | POA: Insufficient documentation

## 2013-10-24 DIAGNOSIS — N186 End stage renal disease: Secondary | ICD-10-CM

## 2013-10-24 DIAGNOSIS — Y831 Surgical operation with implant of artificial internal device as the cause of abnormal reaction of the patient, or of later complication, without mention of misadventure at the time of the procedure: Secondary | ICD-10-CM | POA: Insufficient documentation

## 2013-10-24 DIAGNOSIS — T82898A Other specified complication of vascular prosthetic devices, implants and grafts, initial encounter: Secondary | ICD-10-CM | POA: Insufficient documentation

## 2013-10-24 NOTE — Progress Notes (Signed)
Established Dialysis Access  History of Present Illness  Natalie [name] is a 42 y.o. ([birth year]) female who presents for re-evaluation of R BC AVF.  The HD center has been having difficulties with cannulating the fistula.  There recent was some infilitration.  She current dialyzes from a RIJ TDC.  Past Medical History  Diagnosis Date  . Renal disorder     ESRD  . Diabetes mellitus     Type II  . Anemia   . Thyroid disease     Hyperpara-thyroidism  . DVT (deep venous thrombosis)   . Atrial fibrillation   . Hypertension     Past Surgical History  Procedure Laterality Date  . Av fistula placement Right 05/26/2013    Procedure: Right Brachiocephalic  ARTERIOVENOUS (AV) FISTULA CREATION;  Surgeon: Fransisco Hertz, MD;  Location: [facility];  Service: Vascular;  Laterality: Right;    History   Social History  . Marital Status: Single    Spouse Name: N/A    Number of Children: N/A  . Years of Education: N/A   Occupational History  . Not on file.   Social History Main Topics  . Smoking status: Never Smoker   . Smokeless tobacco: Never Used  . Alcohol Use: No  . Drug Use: No  . Sexual Activity: Not on file   Other Topics Concern  . Not on file   Social History Narrative  . No narrative on file    Family History  Problem Relation Age of Onset  . Heart attack Mother   . Heart disease Mother     Heart Disease before age 77  . Diabetes Sister   . Kidney disease Father     Current Outpatient Prescriptions on File Prior to Visit  Medication Sig Dispense Refill  . amLODipine (NORVASC) 10 MG tablet Take 10 mg by mouth daily.      . calcium acetate (PHOSLO) 667 MG capsule Take 667 mg by mouth 3 (three) times daily with meals.      . Calcium Carbonate Antacid (TUMS PO) Take 200 mg by mouth 3 (three) times daily.      . carvedilol (COREG) 25 MG tablet Take 25 mg by mouth 2 (two) times daily with a meal.      . docusate sodium (COLACE) 100 MG capsule Take 100 mg by  mouth daily.      . insulin lispro (HUMALOG) 100 UNIT/ML injection Inject 2-10 Units into the skin 3 (three) times daily before meals.      Marland Kitchen lisinopril (PRINIVIL,ZESTRIL) 40 MG tablet Take 40 mg by mouth daily.      Marland Kitchen oxyCODONE (ROXICODONE) 5 MG immediate release tablet Take 1 tablet (5 mg total) by mouth every 4 (four) hours as needed for pain.  30 tablet  0  . sodium bicarbonate 650 MG tablet Take 650 mg by mouth 3 (three) times daily.       No current facility-administered medications on file prior to visit.    No Known Allergies  REVIEW OF SYSTEMS:  (Positives checked otherwise negative)  CARDIOVASCULAR:  []  chest pain, []  chest pressure, []  palpitations, []  shortness of breath when laying flat, []  shortness of breath with exertion,  []  pain in feet when walking, []  pain in feet when laying flat, []  history of blood clot in veins (DVT), []  history of phlebitis, []  swelling in legs, []  varicose veins  PULMONARY:  []  productive cough, []  asthma, []  wheezing  NEUROLOGIC:  []  weakness in  arms or legs, []  numbness in arms or legs, []  difficulty speaking or slurred speech, []  temporary loss of vision in one eye, []  dizziness  HEMATOLOGIC:  []  bleeding problems, []  problems with blood clotting too easily  MUSCULOSKEL:  []  joint pain, []  joint swelling  GASTROINTEST:  []  vomiting blood, []  blood in stool     GENITOURINARY:  []  burning with urination, []  blood in urine, [x]  ESRD: T-R-S  PSYCHIATRIC:  []  history of major depression  INTEGUMENTARY:  []  rashes, []  ulcers  Physical Examination  Filed Vitals:   10/24/13 1155  BP: 176/77  Pulse: 66  Temp: 97.6 F (36.4 C)  TempSrc: Oral  Resp: 16  Height: 4\' 11"  (1.499 m)  Weight: 128 lb (58.06 kg)  SpO2: 98%   Body mass index is 25.84 kg/(m^2).  General: A&O x 3, WD, WN  Pulmonary: Sym exp, good air movt, CTAB, no rales, rhonchi, & wheezing  Cardiac: RRR, Nl S1, S2, no Murmurs, rubs or gallops  Vascular: palpable R BC AVF  with echymosis overlying proximal segment  Gastrointestinal: soft, NTND, -G/R, - HSM, - masses, - CVAT B  Musculoskeletal: M/S 5/5 throughout , Extremities without  ischemic changes , palpable thrill in access in upper arm, + bruit in access  Neurologic: Pain and light touch intact in extremities Motor exam as listed above  Non-Invasive Vascular Imaging  Right arm Access Duplex  (Date: 10/24/2013):   Diameters:  2.4-6.1 mm  Depth:  1.9-13 mm  Medical Decision Making  Natalie [name] is a 42 y.o. female who presents with R BC AVF with difficulty cannulating.   Based on exam and duplex, I would proceed with R BC AVF superficialization with side branch ligation.  Depending on the intraoperative findings, revision of the anastomosis might be indicated.  The patient is aware that the risks of access surgery include but are not limited to: bleeding, infection, steal syndrome, nerve damage, ischemic monomelic neuropathy, failure of access to mature, and possible need for additional access procedures in the future.  The patient has agreed to proceed with the above procedure which will be scheduled 31 DEC 15.  One of my partners will perform the procedure, as I will be out that date.  I do not want to delay the procedure as the patient is currently dialyzing through his RIJ TDC.  Leonides SakeBrian Chen, MD [facility] [place] Office: [phone] Pager: [phone]  10/24/2013, 12:34 PM

## 2013-10-28 ENCOUNTER — Other Ambulatory Visit: Payer: Self-pay

## 2013-11-04 ENCOUNTER — Encounter (HOSPITAL_COMMUNITY): Payer: Self-pay | Admitting: *Deleted

## 2013-11-04 MED ORDER — DEXTROSE 5 % IV SOLN
1.5000 g | INTRAVENOUS | Status: DC
  Filled 2013-11-04: qty 1.5

## 2013-11-04 NOTE — Progress Notes (Signed)
Pre op call completed using interpretor # [id number].  Patient and her nephew were instructed on medication to take - Norvasc, and to not take Insulin.

## 2013-11-05 ENCOUNTER — Encounter (HOSPITAL_COMMUNITY): Payer: Self-pay | Admitting: *Deleted

## 2013-11-05 ENCOUNTER — Ambulatory Visit (HOSPITAL_COMMUNITY): Payer: Self-pay | Admitting: Certified Registered Nurse Anesthetist

## 2013-11-05 ENCOUNTER — Encounter (HOSPITAL_COMMUNITY)
Admission: RE | Disposition: A | Discharge status: Home / Self Care | Payer: Self-pay | Source: Ambulatory Visit | Attending: Vascular Surgery

## 2013-11-05 ENCOUNTER — Ambulatory Visit (HOSPITAL_COMMUNITY)
Admission: RE | Admit: 2013-11-05 | Discharge: 2013-11-05 | Disposition: A | Discharge status: Home / Self Care | Payer: Self-pay | Source: Ambulatory Visit | Attending: Vascular Surgery | Admitting: Vascular Surgery

## 2013-11-05 ENCOUNTER — Encounter (HOSPITAL_COMMUNITY): Payer: Self-pay | Admitting: Certified Registered Nurse Anesthetist

## 2013-11-05 DIAGNOSIS — Z992 Dependence on renal dialysis: Secondary | ICD-10-CM | POA: Insufficient documentation

## 2013-11-05 DIAGNOSIS — I120 Hypertensive chronic kidney disease with stage 5 chronic kidney disease or end stage renal disease: Secondary | ICD-10-CM | POA: Insufficient documentation

## 2013-11-05 DIAGNOSIS — Z538 Procedure and treatment not carried out for other reasons: Secondary | ICD-10-CM | POA: Insufficient documentation

## 2013-11-05 DIAGNOSIS — N186 End stage renal disease: Secondary | ICD-10-CM | POA: Insufficient documentation

## 2013-11-05 HISTORY — DX: Gastro-esophageal reflux disease without esophagitis: K21.9

## 2013-11-05 HISTORY — DX: Unspecified convulsions: R56.9

## 2013-11-05 HISTORY — DX: Major depressive disorder, single episode, unspecified: F32.9

## 2013-11-05 HISTORY — DX: Depression, unspecified: F32.A

## 2013-11-05 LAB — POCT I-STAT 4, (NA,K, GLUC, HGB,HCT)
GLUCOSE: 89 mg/dL (ref 70–99)
GLUCOSE: 94 mg/dL (ref 70–99)
HCT: 40 % (ref 36.0–46.0)
HCT: 38 % (ref 36.0–46.0)
HEMOGLOBIN: 12.9 g/dL (ref 12.0–15.0)
Hemoglobin: 13.6 g/dL (ref 12.0–15.0)
Potassium: 6.6 mEq/L (ref 3.7–5.3)
Potassium: 4.9 mEq/L (ref 3.7–5.3)
SODIUM: 132 meq/L — AB (ref 137–147)
Sodium: 134 mEq/L — ABNORMAL LOW (ref 137–147)

## 2013-11-05 LAB — GLUCOSE, CAPILLARY
GLUCOSE-CAPILLARY: 86 mg/dL (ref 70–99)
Glucose-Capillary: 85 mg/dL (ref 70–99)

## 2013-11-05 SURGERY — REVISON OF ARTERIOVENOUS FISTULA
Anesthesia: Monitor Anesthesia Care | Site: Arm Upper | Laterality: Right

## 2013-11-05 MED ORDER — SODIUM CHLORIDE 0.9 % IV SOLN
INTRAVENOUS | Status: DC
  Administered 2013-11-05: 11:00:00 via INTRAVENOUS

## 2013-11-05 SURGICAL SUPPLY — 36 items
CANISTER SUCTION 2500CC (MISCELLANEOUS) ×3 IMPLANT
CLIP TI MEDIUM 24 (CLIP) ×3 IMPLANT
CLIP TI MEDIUM 6 (CLIP) ×3 IMPLANT
CLIP TI WIDE RED SMALL 24 (CLIP) ×3 IMPLANT
CLIP TI WIDE RED SMALL 6 (CLIP) ×3 IMPLANT
COVER PROBE W GEL 5X96 (DRAPES) ×3 IMPLANT
COVER SURGICAL LIGHT HANDLE (MISCELLANEOUS) ×3 IMPLANT
DECANTER SPIKE VIAL GLASS SM (MISCELLANEOUS) ×3 IMPLANT
DERMABOND ADVANCED (GAUZE/BANDAGES/DRESSINGS) ×2
DERMABOND ADVANCED .7 DNX12 (GAUZE/BANDAGES/DRESSINGS) ×1 IMPLANT
DRAIN PENROSE 1/2X12 LTX STRL (WOUND CARE) IMPLANT
ELECT REM PT RETURN 9FT ADLT (ELECTROSURGICAL) ×3
ELECTRODE REM PT RTRN 9FT ADLT (ELECTROSURGICAL) ×1 IMPLANT
GEL ULTRASOUND 20GR AQUASONIC (MISCELLANEOUS) IMPLANT
GLOVE BIO SURGEON STRL SZ7 (GLOVE) ×3 IMPLANT
GLOVE BIOGEL PI IND STRL 7.5 (GLOVE) ×1 IMPLANT
GLOVE BIOGEL PI INDICATOR 7.5 (GLOVE) ×2
GOWN STRL NON-REIN LRG LVL3 (GOWN DISPOSABLE) ×9 IMPLANT
KIT BASIN OR (CUSTOM PROCEDURE TRAY) ×3 IMPLANT
KIT ROOM TURNOVER OR (KITS) ×3 IMPLANT
NS IRRIG 1000ML POUR BTL (IV SOLUTION) ×3 IMPLANT
PACK CV ACCESS (CUSTOM PROCEDURE TRAY) ×3 IMPLANT
PAD ARMBOARD 7.5X6 YLW CONV (MISCELLANEOUS) ×6 IMPLANT
SPONGE GAUZE 4X4 12PLY (GAUZE/BANDAGES/DRESSINGS) ×3 IMPLANT
SPONGE SURGIFOAM ABS GEL 100 (HEMOSTASIS) IMPLANT
SUT ETHILON 3 0 PS 1 (SUTURE) IMPLANT
SUT MNCRL AB 4-0 PS2 18 (SUTURE) ×6 IMPLANT
SUT PROLENE 6 0 BV (SUTURE) IMPLANT
SUT PROLENE 7 0 BV 1 (SUTURE) ×3 IMPLANT
SUT SILK 0 TIES 10X30 (SUTURE) ×3 IMPLANT
SUT VIC AB 3-0 SH 27 (SUTURE) ×2
SUT VIC AB 3-0 SH 27X BRD (SUTURE) ×1 IMPLANT
TOWEL OR 17X24 6PK STRL BLUE (TOWEL DISPOSABLE) ×3 IMPLANT
TOWEL OR 17X26 10 PK STRL BLUE (TOWEL DISPOSABLE) ×3 IMPLANT
UNDERPAD 30X30 INCONTINENT (UNDERPADS AND DIAPERS) ×3 IMPLANT
WATER STERILE IRR 1000ML POUR (IV SOLUTION) ×3 IMPLANT

## 2013-11-05 NOTE — Progress Notes (Signed)
Spoke with Lenny Pastelavid Zeyfang, PA with [facility] ok to discharge patient. Patient told to follow up with Dr. Nicky Pughhen's office as directed

## 2013-11-05 NOTE — Progress Notes (Signed)
   Repeat K 6.6.  Nursing will contact Nephrology to see if patient will be dialysed while here.  Case will be canceled today subsequently and rescheduled hopefully this coming Monday.     Leonides SakeBrian Chen, MD [facility] [place] Office: [phone] Pager: [phone]  11/05/2013, 11:58 AM

## 2013-11-05 NOTE — Interval H&P Note (Signed)
History and Physical Interval Note:  11/05/2013 10:47 AM  Natalie HoseIdalia [name]  has presented today for surgery, with the diagnosis of End Stage Renal Disease  The various methods of treatment have been discussed with the patient and family. After consideration of risks, benefits and other options for treatment, the patient has consented to  Procedure(s): SUPERFICIALIZATION OF RIGHT BRACHIAL CEPHALIC ARTERIOVENOUS FISTULA (Right) SIDE BRANCH LIGATION OF COMPETING BRANCHES OF RIGHT BRACHIO-CEPHALIC ARTERIOVENOUS FISTULA (Right) as a surgical intervention .  The patient's history has been reviewed, patient examined, no change in status, stable for surgery.  I have reviewed the patient's chart and labs.  Questions were answered to the patient's satisfaction.     Lillah Standre LIANG-YU

## 2013-11-05 NOTE — H&P (View-Only) (Signed)
Established Dialysis Access  History of Present Illness  Natalie [name] is a 43 y.o. ([birth year]) female who presents for re-evaluation of R BC AVF.  The HD center has been having difficulties with cannulating the fistula.  There recent was some infilitration.  She current dialyzes from a RIJ TDC.  Past Medical History  Diagnosis Date  . Renal disorder     ESRD  . Diabetes mellitus     Type II  . Anemia   . Thyroid disease     Hyperpara-thyroidism  . DVT (deep venous thrombosis)   . Atrial fibrillation   . Hypertension     Past Surgical History  Procedure Laterality Date  . Av fistula placement Right 05/26/2013    Procedure: Right Brachiocephalic  ARTERIOVENOUS (AV) FISTULA CREATION;  Surgeon: Fransisco Hertz, MD;  Location: [facility];  Service: Vascular;  Laterality: Right;    History   Social History  . Marital Status: Single    Spouse Name: N/A    Number of Children: N/A  . Years of Education: N/A   Occupational History  . Not on file.   Social History Main Topics  . Smoking status: Never Smoker   . Smokeless tobacco: Never Used  . Alcohol Use: No  . Drug Use: No  . Sexual Activity: Not on file   Other Topics Concern  . Not on file   Social History Narrative  . No narrative on file    Family History  Problem Relation Age of Onset  . Heart attack Mother   . Heart disease Mother     Heart Disease before age 77  . Diabetes Sister   . Kidney disease Father     Current Outpatient Prescriptions on File Prior to Visit  Medication Sig Dispense Refill  . amLODipine (NORVASC) 10 MG tablet Take 10 mg by mouth daily.      . calcium acetate (PHOSLO) 667 MG capsule Take 667 mg by mouth 3 (three) times daily with meals.      . Calcium Carbonate Antacid (TUMS PO) Take 200 mg by mouth 3 (three) times daily.      . carvedilol (COREG) 25 MG tablet Take 25 mg by mouth 2 (two) times daily with a meal.      . docusate sodium (COLACE) 100 MG capsule Take 100 mg by  mouth daily.      . insulin lispro (HUMALOG) 100 UNIT/ML injection Inject 2-10 Units into the skin 3 (three) times daily before meals.      Marland Kitchen lisinopril (PRINIVIL,ZESTRIL) 40 MG tablet Take 40 mg by mouth daily.      Marland Kitchen oxyCODONE (ROXICODONE) 5 MG immediate release tablet Take 1 tablet (5 mg total) by mouth every 4 (four) hours as needed for pain.  30 tablet  0  . sodium bicarbonate 650 MG tablet Take 650 mg by mouth 3 (three) times daily.       No current facility-administered medications on file prior to visit.    No Known Allergies  REVIEW OF SYSTEMS:  (Positives checked otherwise negative)  CARDIOVASCULAR:  []  chest pain, []  chest pressure, []  palpitations, []  shortness of breath when laying flat, []  shortness of breath with exertion,  []  pain in feet when walking, []  pain in feet when laying flat, []  history of blood clot in veins (DVT), []  history of phlebitis, []  swelling in legs, []  varicose veins  PULMONARY:  []  productive cough, []  asthma, []  wheezing  NEUROLOGIC:  []  weakness in  arms or legs, []  numbness in arms or legs, []  difficulty speaking or slurred speech, []  temporary loss of vision in one eye, []  dizziness  HEMATOLOGIC:  []  bleeding problems, []  problems with blood clotting too easily  MUSCULOSKEL:  []  joint pain, []  joint swelling  GASTROINTEST:  []  vomiting blood, []  blood in stool     GENITOURINARY:  []  burning with urination, []  blood in urine, [x]  ESRD: T-R-S  PSYCHIATRIC:  []  history of major depression  INTEGUMENTARY:  []  rashes, []  ulcers  Physical Examination  Filed Vitals:   10/24/13 1155  BP: 176/77  Pulse: 66  Temp: 97.6 F (36.4 C)  TempSrc: Oral  Resp: 16  Height: 4\' 11"  (1.499 m)  Weight: 128 lb (58.06 kg)  SpO2: 98%   Body mass index is 25.84 kg/(m^2).  General: A&O x 3, WD, WN  Pulmonary: Sym exp, good air movt, CTAB, no rales, rhonchi, & wheezing  Cardiac: RRR, Nl S1, S2, no Murmurs, rubs or gallops  Vascular: palpable R BC AVF  with echymosis overlying proximal segment  Gastrointestinal: soft, NTND, -G/R, - HSM, - masses, - CVAT B  Musculoskeletal: M/S 5/5 throughout , Extremities without  ischemic changes , palpable thrill in access in upper arm, + bruit in access  Neurologic: Pain and light touch intact in extremities Motor exam as listed above  Non-Invasive Vascular Imaging  Right arm Access Duplex  (Date: 10/24/2013):   Diameters:  2.4-6.1 mm  Depth:  1.9-13 mm  Medical Decision Making  Natalie [name] is a 43 y.o. female who presents with R BC AVF with difficulty cannulating.   Based on exam and duplex, I would proceed with R BC AVF superficialization with side branch ligation.  Depending on the intraoperative findings, revision of the anastomosis might be indicated.  The patient is aware that the risks of access surgery include but are not limited to: bleeding, infection, steal syndrome, nerve damage, ischemic monomelic neuropathy, failure of access to mature, and possible need for additional access procedures in the future.  The patient has agreed to proceed with the above procedure which will be scheduled 31 DEC 15.  One of my partners will perform the procedure, as I will be out that date.  I do not want to delay the procedure as the patient is currently dialyzing through his RIJ TDC.  Leonides SakeBrian Chen, MD [facility] [place] Office: [phone] Pager: [phone]  10/24/2013, 12:34 PM

## 2013-11-05 NOTE — Anesthesia Preprocedure Evaluation (Deleted)
Anesthesia Evaluation    Airway       Dental   Pulmonary          Cardiovascular hypertension, Pt. on medications DVT + dysrhythmias Atrial Fibrillation     Neuro/Psych    GI/Hepatic GERD-  Controlled,  Endo/Other  diabetes (glu 85), Insulin DependentHypothyroidism   Renal/GU ESRFRenal disease (K+ 6.6)     Musculoskeletal   Abdominal   Peds  Hematology   Anesthesia Other Findings   Reproductive/Obstetrics                           Anesthesia Physical Anesthesia Plan Anesthesia Quick Evaluation

## 2013-11-12 ENCOUNTER — Other Ambulatory Visit: Payer: Self-pay

## 2013-11-17 ENCOUNTER — Encounter (HOSPITAL_COMMUNITY): Payer: Self-pay | Admitting: *Deleted

## 2013-11-18 MED ORDER — DEXTROSE 5 % IV SOLN
1.5000 g | INTRAVENOUS | Status: AC
  Administered 2013-11-19: 1.5 g via INTRAVENOUS
  Filled 2013-11-18: qty 1.5

## 2013-11-19 ENCOUNTER — Encounter (HOSPITAL_COMMUNITY): Payer: Self-pay | Admitting: *Deleted

## 2013-11-19 ENCOUNTER — Encounter (HOSPITAL_COMMUNITY)
Admission: RE | Disposition: A | Discharge status: Home / Self Care | Payer: Self-pay | Source: Ambulatory Visit | Attending: Vascular Surgery

## 2013-11-19 ENCOUNTER — Encounter (HOSPITAL_COMMUNITY): Payer: Self-pay | Admitting: Certified Registered Nurse Anesthetist

## 2013-11-19 ENCOUNTER — Ambulatory Visit (HOSPITAL_COMMUNITY): Payer: Self-pay | Admitting: Certified Registered Nurse Anesthetist

## 2013-11-19 ENCOUNTER — Ambulatory Visit (HOSPITAL_COMMUNITY)
Admission: RE | Admit: 2013-11-19 | Discharge: 2013-11-19 | Disposition: A | Discharge status: Home / Self Care | Payer: Self-pay | Source: Ambulatory Visit | Attending: Vascular Surgery | Admitting: Vascular Surgery

## 2013-11-19 DIAGNOSIS — I120 Hypertensive chronic kidney disease with stage 5 chronic kidney disease or end stage renal disease: Secondary | ICD-10-CM | POA: Insufficient documentation

## 2013-11-19 DIAGNOSIS — Y832 Surgical operation with anastomosis, bypass or graft as the cause of abnormal reaction of the patient, or of later complication, without mention of misadventure at the time of the procedure: Secondary | ICD-10-CM | POA: Insufficient documentation

## 2013-11-19 DIAGNOSIS — N186 End stage renal disease: Secondary | ICD-10-CM | POA: Insufficient documentation

## 2013-11-19 DIAGNOSIS — T82898A Other specified complication of vascular prosthetic devices, implants and grafts, initial encounter: Secondary | ICD-10-CM

## 2013-11-19 DIAGNOSIS — I4891 Unspecified atrial fibrillation: Secondary | ICD-10-CM | POA: Insufficient documentation

## 2013-11-19 DIAGNOSIS — Z992 Dependence on renal dialysis: Secondary | ICD-10-CM | POA: Insufficient documentation

## 2013-11-19 DIAGNOSIS — T82598A Other mechanical complication of other cardiac and vascular devices and implants, initial encounter: Secondary | ICD-10-CM | POA: Insufficient documentation

## 2013-11-19 DIAGNOSIS — E0500 Thyrotoxicosis with diffuse goiter without thyrotoxic crisis or storm: Secondary | ICD-10-CM | POA: Insufficient documentation

## 2013-11-19 DIAGNOSIS — Z86718 Personal history of other venous thrombosis and embolism: Secondary | ICD-10-CM | POA: Insufficient documentation

## 2013-11-19 DIAGNOSIS — Z794 Long term (current) use of insulin: Secondary | ICD-10-CM | POA: Insufficient documentation

## 2013-11-19 DIAGNOSIS — E119 Type 2 diabetes mellitus without complications: Secondary | ICD-10-CM | POA: Insufficient documentation

## 2013-11-19 HISTORY — PX: REVISON OF ARTERIOVENOUS FISTULA: SHX6074

## 2013-11-19 HISTORY — DX: Other specified postprocedural states: Z98.890

## 2013-11-19 HISTORY — DX: Adverse effect of unspecified anesthetic, initial encounter: T41.45XA

## 2013-11-19 HISTORY — DX: Nausea with vomiting, unspecified: R11.2

## 2013-11-19 HISTORY — DX: Other complications of anesthesia, initial encounter: T88.59XA

## 2013-11-19 LAB — GLUCOSE, CAPILLARY
GLUCOSE-CAPILLARY: 101 mg/dL — AB (ref 70–99)
GLUCOSE-CAPILLARY: 86 mg/dL (ref 70–99)
GLUCOSE-CAPILLARY: 118 mg/dL — AB (ref 70–99)
Glucose-Capillary: 82 mg/dL (ref 70–99)

## 2013-11-19 LAB — POCT I-STAT 4, (NA,K, GLUC, HGB,HCT)
Glucose, Bld: 98 mg/dL (ref 70–99)
HCT: 36 % (ref 36.0–46.0)
Hemoglobin: 12.2 g/dL (ref 12.0–15.0)
Potassium: 5.1 mEq/L (ref 3.7–5.3)
Sodium: 136 mEq/L — ABNORMAL LOW (ref 137–147)

## 2013-11-19 SURGERY — REVISON OF ARTERIOVENOUS FISTULA
Anesthesia: General | Site: Arm Upper | Laterality: Right

## 2013-11-19 MED ORDER — 0.9 % SODIUM CHLORIDE (POUR BTL) OPTIME
TOPICAL | Status: DC | PRN
  Administered 2013-11-19: 1000 mL

## 2013-11-19 MED ORDER — SODIUM CHLORIDE 0.9 % IV SOLN
10.0000 mg | INTRAVENOUS | Status: DC | PRN
  Administered 2013-11-19: 20 ug/min via INTRAVENOUS

## 2013-11-19 MED ORDER — THROMBIN 20000 UNITS EX SOLR
CUTANEOUS | Status: DC | PRN
  Administered 2013-11-19: 14:00:00 via TOPICAL

## 2013-11-19 MED ORDER — FENTANYL CITRATE 0.05 MG/ML IJ SOLN
INTRAMUSCULAR | Status: DC | PRN
  Administered 2013-11-19 (×2): 25 ug via INTRAVENOUS
  Administered 2013-11-19: 150 ug via INTRAVENOUS

## 2013-11-19 MED ORDER — PHENYLEPHRINE HCL 10 MG/ML IJ SOLN
INTRAMUSCULAR | Status: DC | PRN
  Administered 2013-11-19 (×3): 80 ug via INTRAVENOUS

## 2013-11-19 MED ORDER — SUCCINYLCHOLINE CHLORIDE 20 MG/ML IJ SOLN
INTRAMUSCULAR | Status: DC | PRN
  Administered 2013-11-19: 60 mg via INTRAVENOUS

## 2013-11-19 MED ORDER — SODIUM CHLORIDE 0.9 % IR SOLN
Status: DC | PRN
  Administered 2013-11-19: 14:00:00

## 2013-11-19 MED ORDER — ONDANSETRON HCL 4 MG/2ML IJ SOLN
INTRAMUSCULAR | Status: AC
  Filled 2013-11-19: qty 2

## 2013-11-19 MED ORDER — LIDOCAINE HCL (CARDIAC) 20 MG/ML IV SOLN
INTRAVENOUS | Status: AC
  Filled 2013-11-19: qty 5

## 2013-11-19 MED ORDER — PROPOFOL 10 MG/ML IV BOLUS
INTRAVENOUS | Status: DC | PRN
  Administered 2013-11-19: 120 mg via INTRAVENOUS
  Administered 2013-11-19: 30 mg via INTRAVENOUS

## 2013-11-19 MED ORDER — SODIUM CHLORIDE 0.9 % IV SOLN
INTRAVENOUS | Status: DC
  Administered 2013-11-19: 10 mL/h via INTRAVENOUS

## 2013-11-19 MED ORDER — THROMBIN 20000 UNITS EX SOLR
CUTANEOUS | Status: AC
  Filled 2013-11-19: qty 20000

## 2013-11-19 MED ORDER — ARTIFICIAL TEARS OP OINT
TOPICAL_OINTMENT | OPHTHALMIC | Status: DC | PRN
  Administered 2013-11-19: 1 via OPHTHALMIC

## 2013-11-19 MED ORDER — HYDROMORPHONE HCL PF 1 MG/ML IJ SOLN
INTRAMUSCULAR | Status: AC
  Administered 2013-11-19: 0.5 mg via INTRAVENOUS
  Filled 2013-11-19: qty 1

## 2013-11-19 MED ORDER — HYDROMORPHONE HCL PF 1 MG/ML IJ SOLN
0.2500 mg | INTRAMUSCULAR | Status: DC | PRN
  Administered 2013-11-19 (×2): 0.5 mg via INTRAVENOUS

## 2013-11-19 MED ORDER — OXYCODONE HCL 5 MG PO TABS: 5.0000 mg | ORAL_TABLET | ORAL | Status: DC | PRN

## 2013-11-19 MED ORDER — ONDANSETRON HCL 4 MG/2ML IJ SOLN
4.0000 mg | Freq: Once | INTRAMUSCULAR | Status: AC | PRN
  Administered 2013-11-19: 4 mg via INTRAVENOUS

## 2013-11-19 MED ORDER — SODIUM CHLORIDE 0.9 % IV SOLN
INTRAVENOUS | Status: DC | PRN
  Administered 2013-11-19 (×2): via INTRAVENOUS

## 2013-11-19 MED ORDER — PROPOFOL 10 MG/ML IV BOLUS
INTRAVENOUS | Status: AC
  Filled 2013-11-19: qty 20

## 2013-11-19 MED ORDER — LIDOCAINE HCL 1 % IJ SOLN
INTRAMUSCULAR | Status: DC | PRN
  Administered 2013-11-19: 100 mg via INTRADERMAL

## 2013-11-19 MED ORDER — FENTANYL CITRATE 0.05 MG/ML IJ SOLN
INTRAMUSCULAR | Status: AC
  Filled 2013-11-19: qty 5

## 2013-11-19 MED ORDER — PHENYLEPHRINE 40 MCG/ML (10ML) SYRINGE FOR IV PUSH (FOR BLOOD PRESSURE SUPPORT)
PREFILLED_SYRINGE | INTRAVENOUS | Status: AC
  Filled 2013-11-19: qty 10

## 2013-11-19 MED ORDER — ONDANSETRON HCL 4 MG/2ML IJ SOLN
INTRAMUSCULAR | Status: DC | PRN
  Administered 2013-11-19: 4 mg via INTRAVENOUS

## 2013-11-19 SURGICAL SUPPLY — 49 items
BANDAGE GAUZE ELAST BULKY 4 IN (GAUZE/BANDAGES/DRESSINGS) ×3 IMPLANT
CANISTER SUCTION 2500CC (MISCELLANEOUS) ×3 IMPLANT
CLIP TI MEDIUM 6 (CLIP) ×3 IMPLANT
CLIP TI WIDE RED SMALL 6 (CLIP) ×6 IMPLANT
COVER PROBE W GEL 5X96 (DRAPES) ×3 IMPLANT
COVER SURGICAL LIGHT HANDLE (MISCELLANEOUS) ×3 IMPLANT
DECANTER SPIKE VIAL GLASS SM (MISCELLANEOUS) IMPLANT
DERMABOND ADVANCED (GAUZE/BANDAGES/DRESSINGS) ×4
DERMABOND ADVANCED .7 DNX12 (GAUZE/BANDAGES/DRESSINGS) ×2 IMPLANT
DRAIN PENROSE 1/2X12 LTX STRL (WOUND CARE) IMPLANT
ELECT REM PT RETURN 9FT ADLT (ELECTROSURGICAL) ×3
ELECTRODE REM PT RTRN 9FT ADLT (ELECTROSURGICAL) ×1 IMPLANT
GAUZE SPONGE 4X4 16PLY XRAY LF (GAUZE/BANDAGES/DRESSINGS) ×3 IMPLANT
GLOVE BIO SURGEON STRL SZ 6.5 (GLOVE) ×2 IMPLANT
GLOVE BIO SURGEON STRL SZ7 (GLOVE) ×3 IMPLANT
GLOVE BIO SURGEONS STRL SZ 6.5 (GLOVE) ×1
GLOVE BIOGEL PI IND STRL 6.5 (GLOVE) ×2 IMPLANT
GLOVE BIOGEL PI IND STRL 7.0 (GLOVE) ×1 IMPLANT
GLOVE BIOGEL PI IND STRL 7.5 (GLOVE) ×1 IMPLANT
GLOVE BIOGEL PI INDICATOR 6.5 (GLOVE) ×4
GLOVE BIOGEL PI INDICATOR 7.0 (GLOVE) ×2
GLOVE BIOGEL PI INDICATOR 7.5 (GLOVE) ×2
GLOVE ECLIPSE 6.5 STRL STRAW (GLOVE) ×3 IMPLANT
GLOVE SURG SS PI 6.5 STRL IVOR (GLOVE) ×3 IMPLANT
GOWN SPEC L3 MED W/TWL (GOWN DISPOSABLE) ×3 IMPLANT
GOWN STRL REUS W/ TWL LRG LVL3 (GOWN DISPOSABLE) ×3 IMPLANT
GOWN STRL REUS W/TWL LRG LVL3 (GOWN DISPOSABLE) ×6
KIT BASIN OR (CUSTOM PROCEDURE TRAY) ×3 IMPLANT
KIT ROOM TURNOVER OR (KITS) ×3 IMPLANT
NS IRRIG 1000ML POUR BTL (IV SOLUTION) ×3 IMPLANT
PACK CV ACCESS (CUSTOM PROCEDURE TRAY) ×3 IMPLANT
PAD ARMBOARD 7.5X6 YLW CONV (MISCELLANEOUS) ×6 IMPLANT
SPONGE GAUZE 4X4 12PLY (GAUZE/BANDAGES/DRESSINGS) ×3 IMPLANT
SPONGE SURGIFOAM ABS GEL 100 (HEMOSTASIS) IMPLANT
STOPCOCK 4 WAY LG BORE MALE ST (IV SETS) IMPLANT
SUT ETHILON 3 0 PS 1 (SUTURE) ×3 IMPLANT
SUT MNCRL AB 4-0 PS2 18 (SUTURE) ×12 IMPLANT
SUT PROLENE 6 0 BV (SUTURE) ×9 IMPLANT
SUT PROLENE 7 0 BV 1 (SUTURE) ×3 IMPLANT
SUT SILK 2 0 SH (SUTURE) ×3 IMPLANT
SUT SILK 3 0 (SUTURE) ×2
SUT SILK 3-0 18XBRD TIE 12 (SUTURE) ×1 IMPLANT
SUT VIC AB 3-0 SH 27 (SUTURE) ×10
SUT VIC AB 3-0 SH 27X BRD (SUTURE) ×5 IMPLANT
TOWEL OR 17X24 6PK STRL BLUE (TOWEL DISPOSABLE) ×3 IMPLANT
TOWEL OR 17X26 10 PK STRL BLUE (TOWEL DISPOSABLE) ×3 IMPLANT
TUBING EXTENTION W/L.L. (IV SETS) IMPLANT
UNDERPAD 30X30 INCONTINENT (UNDERPADS AND DIAPERS) ×3 IMPLANT
WATER STERILE IRR 1000ML POUR (IV SOLUTION) ×3 IMPLANT

## 2013-11-19 NOTE — Anesthesia Postprocedure Evaluation (Signed)
  Anesthesia Post-op Note  Patient: Natalie [name]  Procedure(s) Performed: Procedure(s): SUPERFICIALIZATION OF RIGHT BRACHIOCEPHALIC ARTERIOVENOUS FISTULA WITH SIDE BRANCH LIGATION; ULTRASOUND GUIDED (Right)  Patient Location: PACU  Anesthesia Type:General  Level of Consciousness: awake, oriented, sedated and patient cooperative  Airway and Oxygen Therapy: Patient Spontanous Breathing  Post-op Pain: mild  Post-op Assessment: Post-op Vital signs reviewed, Patient's Cardiovascular Status Stable, Respiratory Function Stable, Patent Airway, No signs of Nausea or vomiting and Pain level controlled  Post-op Vital Signs: stable  Complications: No apparent anesthesia complications

## 2013-11-19 NOTE — Transfer of Care (Signed)
Immediate Anesthesia Transfer of Care Note  Patient: Natalie [name]  Procedure(s) Performed: Procedure(s): SUPERFICIALIZATION OF RIGHT BRACHIOCEPHALIC ARTERIOVENOUS FISTULA WITH SIDE BRANCH LIGATION; ULTRASOUND GUIDED (Right)  Patient Location: PACU  Anesthesia Type:General  Level of Consciousness: awake, alert  and patient cooperative  Airway & Oxygen Therapy: Patient Spontanous Breathing and Patient connected to nasal cannula oxygen  Post-op Assessment: Report given to PACU RN and Post -op Vital signs reviewed and stable  Post vital signs: Reviewed and stable  Complications: No apparent anesthesia complications

## 2013-11-19 NOTE — Op Note (Addendum)
OPERATIVE NOTE   PROCEDURE: 1. Revision of right brachiocephalic arteriovenous fistula (redo anastomosis) 2. 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 Sclerotic distal fistula with intimal hyperplasia and frozen valve 2. Two major side-branches with another 4 smaller side branches siphoning flow 3. Fistula only 4 mm proximally, > 6 mm in mid-segment 4. Dopplerable radial signal at end of case  SPECIMEN(S):  none  INDICATIONS:   Natalie [name] is a 43 y.o. female who presents with poorly functioning left brachiocephalic arteriovenous fistula.  On access duplex, problems with the distal fistula and multiple side branches were noted.  The fistula was also deep proximally.  I recommended: sidebranch ligation and superficialization of the left brachiocephalic arteriovenous fistula.  Risk, benefits, and alternatives to access surgery were discussed.  The patient is aware the risks include but are not limited to: bleeding, infection, steal syndrome, nerve damage, ischemic monomelic neuropathy, failure to mature, need for additional procedures, death and stroke.  The patient agrees to proceed forward with the procedure.  DESCRIPTION: After obtaining full informed written consent, the patient was brought back to the operating room and placed supine upon the operating table.  The patient received IV antibiotics prior to induction.  After obtaining adequate anesthesia, the patient was prepped and draped in the standard fashion for: right access procedure.  I marked the location of the fistula on the right arm with some difficulty due to its tortuosity  I made an incision from the anastomosis up to proximal segment prior to descending into the bicipital groove.  The distal half of  this fistula demonstrated extensive inflammation around the fistula.  The proximal half was untouched but small at 4 mm.  The distal fistula barely had a pulse much less a thrill in the vein and was only 2-3 mm in diameter.  Subsequently, I felt revision of the anastomosis with a better segment of the vein was going to be necessary.  I dissected out this fistula, tying off side branches in this process.  There were two large side branches 2-3 mm in diameter.  I also tied off another 4 smaller side branches in the process of mobilizing this vein.   The anastomosis was severe scarred and the vein did not appear long enough to do the new anastomosis at the prior site, so I made an incision over the branch artery more proximal to the prior incision and dissected out the artery, which was only 2.5-3.0 mm in diameter.  I got control proximally and distally with vessel loops.   At this point, I clamped the distal fistula and proximal fistula.  I transected the fistula and tied off the distal fistula.  I tried to cannulate the distal fistula but the lumen was extensively compromised.  I opened it sharply with a Pott's scissor.  There was extensive hyperplasia in the vein with a frozen valve.  I extended the venotomy until I found an acceptable segment.  I tunneled a short skin bridge from the new brachial artery exposure to the open fistula harvest incision with a metal tunneler.  I tied the fistula to the inner cannula and delivered the vein, taking care to maintain orientation with a previous anterior mark.  I sharply released the fistula and removed the metal tunnel.  I placed the brachial artery  under tension proximally and distally.  I made an arteriotomy and extended it with a Pott's scissor.  I sharply adjusted the length of the fistula to meet the dimensions of the arteriotomy.  The fistula was sewn to the brachial artery with a running stitch of 6-0 Prolene.  A few bleeding points had to be repaired with  interrupted 6-0 Prolene.  After releasing all clamps and vessel loops, there was a faint thrill in the fistula with improved blood flow in the fistula as evident with compression of outflow.  I expect this fistula to develop an improved thrill once the patient's hypotension improves.  The radial signal did not augment more than 50% with venous outflow.    I then made a groove in the medial skin side of the fistula incision and transposed the vein into the groove.  It was secured with 3 3-0 Vicryl mattress stitches in the subcutaneous tissue.  Thrombin and gelfoam was applied to all incisions.  After waiting a few minute, no further active bleeding was occurring.  The new brachial artery exposure was repaired with a running 3-0 Vicryl stitch in the subcutaneous tissue.  The skin was reapproximated with a running subcuticular of 4-0 Monocryl.  The skin was cleaned, dried, and reinforced with Dermabond.  The fistula harvest incision was repaired with mattress stitches of 3-0 Vicryl in the deep subcutaneous tissue.  The subdermal layer was reapproximated with a running 3-0 Vicryl.  There remained a constant venous ooze from the prior stitch, despite pressure applied to the incision.  I elected to staple the skin to reapproximate it.  This eliminated any further venous oozing.  The right arm was washed out and a sterile bandage applied to the incision.  The right arm was wrapped with a Kerlix.    At the end of case, there was faintly palpable thrill with a good doppler signal in the fistula consistent with a widely patent arteriovenous fistula.  Distally, I could detect a radial signal that did not augment more that 50% with compression.  COMPLICATIONS: none  CONDITION: stable.    Natalie SakeBrian Sherre Wooton, MD [facility] [place] Office: [phone] Pager: [phone]  11/19/2013, 3:44 PM

## 2013-11-19 NOTE — Interval H&P Note (Signed)
[facility] [place]  History and Physical Update  The patient was interviewed and re-examined.  The patient's previous History and Physical has been reviewed and is unchanged.  There is no change in the plan of care: Superficialization of R BC AVF with side branch ligaiton and possible revision of anastomosis.  Leonides SakeBrian Chen, MD [facility] [place] Office: [phone] Pager: [phone]  11/19/2013, 10:45 AM

## 2013-11-19 NOTE — H&P (View-Only) (Signed)
Established Dialysis Access  History of Present Illness  Natalie [name] is a 43 y.o. ([birth year]) female who presents for re-evaluation of R BC AVF.  The HD center has been having difficulties with cannulating the fistula.  There recent was some infilitration.  She current dialyzes from a RIJ TDC.  Past Medical History  Diagnosis Date  . Renal disorder     ESRD  . Diabetes mellitus     Type II  . Anemia   . Thyroid disease     Hyperpara-thyroidism  . DVT (deep venous thrombosis)   . Atrial fibrillation   . Hypertension     Past Surgical History  Procedure Laterality Date  . Av fistula placement Right 05/26/2013    Procedure: Right Brachiocephalic  ARTERIOVENOUS (AV) FISTULA CREATION;  Surgeon: Fransisco Hertz, MD;  Location: [facility];  Service: Vascular;  Laterality: Right;    History   Social History  . Marital Status: Single    Spouse Name: N/A    Number of Children: N/A  . Years of Education: N/A   Occupational History  . Not on file.   Social History Main Topics  . Smoking status: Never Smoker   . Smokeless tobacco: Never Used  . Alcohol Use: No  . Drug Use: No  . Sexual Activity: Not on file   Other Topics Concern  . Not on file   Social History Narrative  . No narrative on file    Family History  Problem Relation Age of Onset  . Heart attack Mother   . Heart disease Mother     Heart Disease before age 77  . Diabetes Sister   . Kidney disease Father     Current Outpatient Prescriptions on File Prior to Visit  Medication Sig Dispense Refill  . amLODipine (NORVASC) 10 MG tablet Take 10 mg by mouth daily.      . calcium acetate (PHOSLO) 667 MG capsule Take 667 mg by mouth 3 (three) times daily with meals.      . Calcium Carbonate Antacid (TUMS PO) Take 200 mg by mouth 3 (three) times daily.      . carvedilol (COREG) 25 MG tablet Take 25 mg by mouth 2 (two) times daily with a meal.      . docusate sodium (COLACE) 100 MG capsule Take 100 mg by  mouth daily.      . insulin lispro (HUMALOG) 100 UNIT/ML injection Inject 2-10 Units into the skin 3 (three) times daily before meals.      Marland Kitchen lisinopril (PRINIVIL,ZESTRIL) 40 MG tablet Take 40 mg by mouth daily.      Marland Kitchen oxyCODONE (ROXICODONE) 5 MG immediate release tablet Take 1 tablet (5 mg total) by mouth every 4 (four) hours as needed for pain.  30 tablet  0  . sodium bicarbonate 650 MG tablet Take 650 mg by mouth 3 (three) times daily.       No current facility-administered medications on file prior to visit.    No Known Allergies  REVIEW OF SYSTEMS:  (Positives checked otherwise negative)  CARDIOVASCULAR:  []  chest pain, []  chest pressure, []  palpitations, []  shortness of breath when laying flat, []  shortness of breath with exertion,  []  pain in feet when walking, []  pain in feet when laying flat, []  history of blood clot in veins (DVT), []  history of phlebitis, []  swelling in legs, []  varicose veins  PULMONARY:  []  productive cough, []  asthma, []  wheezing  NEUROLOGIC:  []  weakness in  arms or legs, []  numbness in arms or legs, []  difficulty speaking or slurred speech, []  temporary loss of vision in one eye, []  dizziness  HEMATOLOGIC:  []  bleeding problems, []  problems with blood clotting too easily  MUSCULOSKEL:  []  joint pain, []  joint swelling  GASTROINTEST:  []  vomiting blood, []  blood in stool     GENITOURINARY:  []  burning with urination, []  blood in urine, [x]  ESRD: T-R-S  PSYCHIATRIC:  []  history of major depression  INTEGUMENTARY:  []  rashes, []  ulcers  Physical Examination  Filed Vitals:   10/24/13 1155  BP: 176/77  Pulse: 66  Temp: 97.6 F (36.4 C)  TempSrc: Oral  Resp: 16  Height: 4\' 11"  (1.499 m)  Weight: 128 lb (58.06 kg)  SpO2: 98%   Body mass index is 25.84 kg/(m^2).  General: A&O x 3, WD, WN  Pulmonary: Sym exp, good air movt, CTAB, no rales, rhonchi, & wheezing  Cardiac: RRR, Nl S1, S2, no Murmurs, rubs or gallops  Vascular: palpable R BC AVF  with echymosis overlying proximal segment  Gastrointestinal: soft, NTND, -G/R, - HSM, - masses, - CVAT B  Musculoskeletal: M/S 5/5 throughout , Extremities without  ischemic changes , palpable thrill in access in upper arm, + bruit in access  Neurologic: Pain and light touch intact in extremities Motor exam as listed above  Non-Invasive Vascular Imaging  Right arm Access Duplex  (Date: 10/24/2013):   Diameters:  2.4-6.1 mm  Depth:  1.9-13 mm  Medical Decision Making  Natalie [name] is a 43 y.o. female who presents with R BC AVF with difficulty cannulating.   Based on exam and duplex, I would proceed with R BC AVF superficialization with side branch ligation.  Depending on the intraoperative findings, revision of the anastomosis might be indicated.  The patient is aware that the risks of access surgery include but are not limited to: bleeding, infection, steal syndrome, nerve damage, ischemic monomelic neuropathy, failure of access to mature, and possible need for additional access procedures in the future.  The patient has agreed to proceed with the above procedure which will be scheduled 31 DEC 15.  One of my partners will perform the procedure, as I will be out that date.  I do not want to delay the procedure as the patient is currently dialyzing through his RIJ TDC.  Leonides SakeBrian Chen, MD [facility] [place] Office: [phone] Pager: [phone]  10/24/2013, 12:34 PM

## 2013-11-19 NOTE — Preoperative (Signed)
Beta Blockers   Reason not to administer Beta Blockers:Not Applicable 

## 2013-11-19 NOTE — Anesthesia Preprocedure Evaluation (Signed)
Anesthesia Evaluation  Patient identified by MRN, date of birth, ID band Patient awake    Reviewed: Allergy & Precautions, H&P , NPO status , Patient's Chart, lab work & pertinent test results  History of Anesthesia Complications (+) PONV  Airway       Dental   Pulmonary          Cardiovascular hypertension, + dysrhythmias     Neuro/Psych Seizures -,     GI/Hepatic   Endo/Other  diabetes, Type 2, Insulin Dependent  Renal/GU ESRF, Dialysis and CRFRenal disease     Musculoskeletal   Abdominal   Peds  Hematology  (+) anemia ,   Anesthesia Other Findings Spanish language only   Reproductive/Obstetrics                           Anesthesia Physical Anesthesia Plan  ASA: III  Anesthesia Plan: General   Post-op Pain Management:    Induction: Intravenous  Airway Management Planned: LMA and Oral ETT  Additional Equipment:   Intra-op Plan:   Post-operative Plan: Extubation in OR  Informed Consent:   Plan Discussed with:   Anesthesia Plan Comments:         Anesthesia Quick Evaluation

## 2013-11-19 NOTE — Anesthesia Procedure Notes (Signed)
Procedure Name: LMA Insertion Date/Time: 11/19/2013 1:13 PM Performed by: Angelica PouSMITH, Crystalynn Mcinerney PIZZICARA Pre-anesthesia Checklist: Patient identified, Emergency Drugs available, Suction available, Patient being monitored and Timeout performed Patient Re-evaluated:Patient Re-evaluated prior to inductionOxygen Delivery Method: Circle system utilized Preoxygenation: Pre-oxygenation with 100% oxygen Intubation Type: IV induction Ventilation: Oral airway inserted - appropriate to patient size LMA: LMA inserted LMA Size: 4.0 Laryngoscope Size: Mac and 3 Grade View: Grade I Tube type: Oral Tube size: 7.0 mm Number of attempts: 1 Airway Equipment and Method: Stylet and Oral airway Placement Confirmation: ETT inserted through vocal cords under direct vision,  positive ETCO2 and breath sounds checked- equal and bilateral Secured at: 21 cm Tube secured with: Tape Dental Injury: Teeth and Oropharynx as per pre-operative assessment  Comments: Smooth IV induction by Dr Katrinka BlazingSmith. Easy seating of LMA #4 with +etCO2, Tv >300.  Shortly after LMA insertion, pt began to desat. LMA removed, mask ventilation provided. Easy ATOI by Pasty ArchJ Koichi Platte using Mac 3. +etCO2, BS=B.

## 2013-11-20 ENCOUNTER — Telehealth: Payer: Self-pay | Admitting: Vascular Surgery

## 2013-11-20 NOTE — Telephone Encounter (Addendum)
Message copied by Fredrich BirksMILLIKAN, DANA P on Thu Nov 20, 2013  1:50 PM ------      Message from: Lorin MercyMCCHESNEY, MARILYN K      Created: Thu Nov 20, 2013  8:39 AM      Regarding: Schedule                   ----- Message -----         From: Erenest Blankarol Sue Pullins, RN         Sent: 11/19/2013   5:51 PM           To: Sharee PimpleMarilyn K McChesney, CMA                        ----- Message -----         From: Fransisco HertzBrian L Chen, MD         Sent: 11/19/2013   4:14 PM           To: Reuel DerbySusan H Large, Conley Simmondsarol Sue Pullins, RN            Berkley Harveydalia Wimer      [phone]      26-Jun-1971            PROCEDURE:      Revision of right brachiocephalic arteriovenous fistula (redo anastomosis)      Side-branch ligation x 6             Follow-up: 2 weeks (Nurse visit for staple removal), 4 wk follow up       ------  11/20/13: patient is spanish speaking. I have requested that language resources call to inform patient of both appts. I will also mail information in the event that the patient has an interpreter at home, dpm

## 2013-11-21 ENCOUNTER — Encounter (HOSPITAL_COMMUNITY): Payer: Self-pay | Admitting: Vascular Surgery

## 2013-12-02 ENCOUNTER — Encounter: Payer: Self-pay | Admitting: Family

## 2013-12-03 ENCOUNTER — Ambulatory Visit: Payer: Self-pay | Admitting: Family

## 2013-12-09 ENCOUNTER — Encounter: Payer: Self-pay | Admitting: Family

## 2013-12-10 ENCOUNTER — Encounter: Payer: Self-pay | Admitting: Family

## 2013-12-10 ENCOUNTER — Ambulatory Visit (INDEPENDENT_AMBULATORY_CARE_PROVIDER_SITE_OTHER): Payer: Self-pay | Admitting: Family

## 2013-12-10 VITALS — BP 137/77 | HR 84 | Temp 98.1°F | Resp 16 | Ht 59.0 in | Wt 128.0 lb

## 2013-12-10 DIAGNOSIS — Z48812 Encounter for surgical aftercare following surgery on the circulatory system: Secondary | ICD-10-CM

## 2013-12-10 DIAGNOSIS — N186 End stage renal disease: Secondary | ICD-10-CM

## 2013-12-10 DIAGNOSIS — G8918 Other acute postprocedural pain: Secondary | ICD-10-CM | POA: Insufficient documentation

## 2013-12-10 MED ORDER — OXYCODONE HCL 5 MG PO TABS: 5.0000 mg | ORAL_TABLET | ORAL | Status: DC | PRN

## 2013-12-10 NOTE — Progress Notes (Signed)
    Established Previous Access  History of Present Illness  Natalie [name] is a 43 y.o. (07-Feb-1971) female patient of Dr. Imogene Burnhen is s/p revision of right brachiocephalic arteriovenous fistula (redo anastomosis) Side-branch ligation x 6 on 11/19/2013. She presents for staples removal today. She is here with a young female and Spanish interpreter.  She has occasional tingling and numbness in right hand. Patient indicates that the prescribed analgesic has adequately relieved her pain, but sh has run out.  The patient's PMH, PSH, SH, FamHx, Med, and Allergies are unchanged from 10/24/2013.  On ROS today:see HPI for pertinent positives and negatives.  Physical Examination  Filed Vitals:   12/10/13 1443  BP: 137/77  Pulse: 84  Temp: 98.1 F (36.7 C)  TempSrc: Oral  Resp: 16  Height: 4\' 11"  (1.499 m)  Weight: 128 lb (58.06 kg)  SpO2: 98%   Body mass index is 25.84 kg/(m^2).   General: A&O x 3, Spanish speaking female.  Pulmonary: Respirations are non-labored.  Cardiac: RRR, temporary HD access, right upper chest.  Vascular: Vessel Right Left  Radial notPalpable Palpable  Brachial Palpable Palpable   Strong palpable thrill at right upper arm AV fistula. Incision edges well proximated, no evidence of infection, no drainage, no swelling, minimal redness.   Musculoskeletal: M/S 4/5 throughout, Extremities without ischemic changes.  Neurologic: Pain and light touch intact in extremities, Motor exam as listed above  Medical Decision Making  Natalie Harveydalia Defino is a 43 y.o. female who presents s/p revision of right brachiocephalic arteriovenous fistula (redo anastomosis) Side-branch ligation x 6 on 11/19/2013.  Renew oxycodone 5 mg, 1 tab every 4 hours prn pain, disp #30, no refills   Based on the patient's vascular studies and examination, Dr. Edilia Boickson examined AV fistula revision site, return in 2 weeks to see Dr. Imogene Burnhen, he will determine at that time when use of the AVF  will be suitable for HD, until then, continue to use temporary catheter, right upper chest.  Charisse MarchSuzanne Zyquan Crotty, RN, MSN, FNP-C [facility] [place] Office: [phone]   12/10/2013, 2:48 PM

## 2013-12-18 ENCOUNTER — Encounter: Payer: Self-pay | Admitting: Vascular Surgery

## 2013-12-19 ENCOUNTER — Encounter: Payer: Self-pay | Admitting: Vascular Surgery

## 2014-01-01 ENCOUNTER — Encounter: Payer: Self-pay | Admitting: Vascular Surgery

## 2014-01-02 ENCOUNTER — Encounter: Payer: Self-pay | Admitting: Vascular Surgery

## 2014-01-02 ENCOUNTER — Ambulatory Visit (INDEPENDENT_AMBULATORY_CARE_PROVIDER_SITE_OTHER): Payer: Self-pay | Admitting: Vascular Surgery

## 2014-01-02 VITALS — BP 176/78 | HR 82 | Ht 59.0 in | Wt 126.5 lb

## 2014-01-02 DIAGNOSIS — N186 End stage renal disease: Secondary | ICD-10-CM

## 2014-01-02 NOTE — Progress Notes (Signed)
    Postoperative Access Visit   History of Present Illness  Natalie [name] is a 43 y.o. year old female who presents for postoperative follow-up for: revision of R BC AVF w/ SBL (Date: 11/19/13).  The patient's wounds are healed.  The patient notes no steal symptoms.  The patient is able to complete their activities of daily living.  The patient's current symptoms are: mild mid-hand numbness.  They already using one needle with the fistula.  For VQI Use Only  PRE-ADM LIVING: Home  AMB STATUS: Ambulatory  Physical Examination Filed Vitals:   01/02/14 1636  BP: 176/78  Pulse: 82    RUE: Incision is healed, skin feels warm, hand grip is 5/5, sensation in digits is intact, palpable thrill, bruit can be auscultated , CR < 2 sec  Medical Decision Making  Natalie Harveydalia Watters is a 43 y.o. year old female who presents s/p R BC AVF revision with sidebranch ligation.  The patient's access is ready for use.  The patient's tunneled dialysis catheter can be removed after two successful cannulations and completed dialysis treatments.  Thank you for allowing us to participate in this patient's care.  Leonides SakeBrian Aliena Ghrist, MD [facility] [place] Office: [phone] Pager: [phone]  01/02/2014, 5:09 PM

## 2014-02-12 ENCOUNTER — Other Ambulatory Visit: Payer: Self-pay | Admitting: *Deleted

## 2014-02-18 ENCOUNTER — Ambulatory Visit (HOSPITAL_COMMUNITY)
Admission: RE | Admit: 2014-02-18 | Discharge: 2014-02-18 | Disposition: A | Discharge status: Home / Self Care | Payer: Medicaid Other | Source: Ambulatory Visit | Attending: Vascular Surgery | Admitting: Vascular Surgery

## 2014-02-18 DIAGNOSIS — Z452 Encounter for adjustment and management of vascular access device: Secondary | ICD-10-CM | POA: Diagnosis not present

## 2014-02-18 DIAGNOSIS — N186 End stage renal disease: Secondary | ICD-10-CM | POA: Insufficient documentation

## 2014-02-18 DIAGNOSIS — Z4901 Encounter for fitting and adjustment of extracorporeal dialysis catheter: Secondary | ICD-10-CM | POA: Diagnosis present

## 2014-02-18 DIAGNOSIS — I120 Hypertensive chronic kidney disease with stage 5 chronic kidney disease or end stage renal disease: Secondary | ICD-10-CM | POA: Diagnosis not present

## 2014-02-18 NOTE — Progress Notes (Signed)
Discharged home; right subclav. dressign CDI without blled/hematoma

## 2014-02-18 NOTE — Progress Notes (Signed)
[facility] SHORT STAY H&P  CC:  Catheter removal   HPI:  This is a 43 y.o. female here for diatek catheter removal.  She had a right brachiocephalic AVF on 05/26/13 and revision of right brachiocephalic AVF with redo anastomosis and side branch ligation.  They are using her left BC AVF without difficulty.  She is not on any anticoagulation.  Her diatek was place in [place] ~ 1 year ago.   Past Medical History  Diagnosis Date  . Renal disorder     ESRD  . Diabetes mellitus     Type II  . Anemia   . Thyroid disease     Hyperpara-thyroidism  . DVT (deep venous thrombosis)   . Hypertension   . GERD (gastroesophageal reflux disease)   . Depression   . Seizures     as a child  . Complication of anesthesia   . PONV (postoperative nausea and vomiting)   . Atrial fibrillation     FH:  Non-Contributory  History   Social History  . Marital Status: Single    Spouse Name: N/A    Number of Children: N/A  . Years of Education: N/A   Occupational History  . Not on file.   Social History Main Topics  . Smoking status: Never Smoker   . Smokeless tobacco: Never Used  . Alcohol Use: No  . Drug Use: No  . Sexual Activity: Not on file   Other Topics Concern  . Not on file   Social History Narrative  . No narrative on file    No Known Allergies  Current Outpatient Prescriptions  Medication Sig Dispense Refill  . amLODipine (NORVASC) 10 MG tablet Take 10 mg by mouth daily.      . Calcium Carbonate Antacid (TUMS PO) Take 200 mg by mouth 3 (three) times daily.      . insulin lispro (HUMALOG) 100 UNIT/ML injection Inject 2-10 Units into the skin 3 (three) times daily before meals.      Marland Kitchen. lisinopril (PRINIVIL,ZESTRIL) 40 MG tablet Take 40 mg by mouth daily.      Marland Kitchen. oxyCODONE (ROXICODONE) 5 MG immediate release tablet Take 1 tablet (5 mg total) by mouth every 4 (four) hours as needed for severe pain.  30 tablet  0   No current facility-administered medications  for this encounter.    ROS:  See HPI  PHYSICAL EXAM  There were no vitals filed for this visit.  Gen:  Well developed well nourished HEENT:  normocephalic Neck:  Right IJ diatek catheter in place Lungs:  Non-labored Abdomen:  Soft NT/ND Extremities:  + palpable thrill in left BC AVF; hand grip is strong Skin:  No obvious rashes Neuro:  intact  Lab/X-ray:  Impression: This is a [age] y.o. female here for diatek catheter removal  Plan:  Removal of right diatek catheter  Doreatha MassedSamantha Miles Leyda, PA-C [facility] [phone] 02/18/2014 1:03 PM

## 2014-02-18 NOTE — Progress Notes (Signed)
[facility] Catheter Removal Procedure Note  Diagnosis: ESRD with Functioning AVF/AVGG  Plan:  Remove right diatek catheter  Consent signed:  yes Time out completed:  yes Coumadin:  no PT/INR (if applicable):   Other labs:   Procedure: 1.  Sterile prepping and draping over catheter area 2. 6 ml 2% lidocaine plain instilled at removal site. 3.  right catheter removed in its entirety with cuff in tact. 4.  Complications: none  5. Tip of catheter sent for culture:  no   Patient tolerated procedure well:  yes Pressure held, no bleeding noted, dressing applied Instructions given to the pt regarding wound care and bleeding.  Other:  Lars Magemma M Tamy Accardo 02/18/2014 1:40 PM

## 2014-02-25 DIAGNOSIS — N186 End stage renal disease: Secondary | ICD-10-CM

## 2014-05-19 ENCOUNTER — Emergency Department (HOSPITAL_COMMUNITY)
Admission: EM | Admit: 2014-05-19 | Discharge: 2014-05-19 | Disposition: A | Discharge status: Home / Self Care | Payer: Medicaid Other | Attending: Emergency Medicine | Admitting: Emergency Medicine

## 2014-05-19 ENCOUNTER — Encounter (HOSPITAL_COMMUNITY): Payer: Self-pay | Admitting: Emergency Medicine

## 2014-05-19 ENCOUNTER — Emergency Department (HOSPITAL_COMMUNITY): Payer: Medicaid Other

## 2014-05-19 DIAGNOSIS — Z8669 Personal history of other diseases of the nervous system and sense organs: Secondary | ICD-10-CM | POA: Insufficient documentation

## 2014-05-19 DIAGNOSIS — Z79899 Other long term (current) drug therapy: Secondary | ICD-10-CM | POA: Insufficient documentation

## 2014-05-19 DIAGNOSIS — I951 Orthostatic hypotension: Secondary | ICD-10-CM | POA: Insufficient documentation

## 2014-05-19 DIAGNOSIS — I120 Hypertensive chronic kidney disease with stage 5 chronic kidney disease or end stage renal disease: Secondary | ICD-10-CM | POA: Insufficient documentation

## 2014-05-19 DIAGNOSIS — Z992 Dependence on renal dialysis: Secondary | ICD-10-CM | POA: Insufficient documentation

## 2014-05-19 DIAGNOSIS — Z86718 Personal history of other venous thrombosis and embolism: Secondary | ICD-10-CM | POA: Insufficient documentation

## 2014-05-19 DIAGNOSIS — Z794 Long term (current) use of insulin: Secondary | ICD-10-CM | POA: Insufficient documentation

## 2014-05-19 DIAGNOSIS — Z862 Personal history of diseases of the blood and blood-forming organs and certain disorders involving the immune mechanism: Secondary | ICD-10-CM | POA: Insufficient documentation

## 2014-05-19 DIAGNOSIS — E119 Type 2 diabetes mellitus without complications: Secondary | ICD-10-CM | POA: Insufficient documentation

## 2014-05-19 DIAGNOSIS — N186 End stage renal disease: Secondary | ICD-10-CM | POA: Insufficient documentation

## 2014-05-19 DIAGNOSIS — Z8659 Personal history of other mental and behavioral disorders: Secondary | ICD-10-CM | POA: Insufficient documentation

## 2014-05-19 DIAGNOSIS — Z8719 Personal history of other diseases of the digestive system: Secondary | ICD-10-CM | POA: Insufficient documentation

## 2014-05-19 DIAGNOSIS — R0602 Shortness of breath: Secondary | ICD-10-CM | POA: Insufficient documentation

## 2014-05-19 LAB — CBC WITH DIFFERENTIAL/PLATELET
BASOS ABS: 0 10*3/uL (ref 0.0–0.1)
Basophils Relative: 0 % (ref 0–1)
Eosinophils Absolute: 0.3 10*3/uL (ref 0.0–0.7)
Eosinophils Relative: 2 % (ref 0–5)
HCT: 37.2 % (ref 36.0–46.0)
Hemoglobin: 12.7 g/dL (ref 12.0–15.0)
LYMPHS PCT: 11 % — AB (ref 12–46)
Lymphs Abs: 1.5 10*3/uL (ref 0.7–4.0)
MCH: 32 pg (ref 26.0–34.0)
MCHC: 34.1 g/dL (ref 30.0–36.0)
MCV: 93.7 fL (ref 78.0–100.0)
Monocytes Absolute: 0.8 10*3/uL (ref 0.1–1.0)
Monocytes Relative: 6 % (ref 3–12)
NEUTROS ABS: 11 10*3/uL — AB (ref 1.7–7.7)
Neutrophils Relative %: 81 % — ABNORMAL HIGH (ref 43–77)
PLATELETS: 406 10*3/uL — AB (ref 150–400)
RBC: 3.97 MIL/uL (ref 3.87–5.11)
RDW: 15.2 % (ref 11.5–15.5)
WBC: 13.7 10*3/uL — AB (ref 4.0–10.5)

## 2014-05-19 LAB — I-STAT CHEM 8, ED
BUN: 13 mg/dL (ref 6–23)
CHLORIDE: 97 meq/L (ref 96–112)
Calcium, Ion: 0.96 mmol/L — ABNORMAL LOW (ref 1.12–1.23)
Creatinine, Ser: 3.6 mg/dL — ABNORMAL HIGH (ref 0.50–1.10)
Glucose, Bld: 149 mg/dL — ABNORMAL HIGH (ref 70–99)
HCT: 40 % (ref 36.0–46.0)
Hemoglobin: 13.6 g/dL (ref 12.0–15.0)
Potassium: 3.3 mEq/L — ABNORMAL LOW (ref 3.7–5.3)
SODIUM: 138 meq/L (ref 137–147)
TCO2: 28 mmol/L (ref 0–100)

## 2014-05-19 LAB — TROPONIN I: Troponin I: <0.3 ng/mL (ref <0.30)

## 2014-05-19 MED ORDER — SODIUM CHLORIDE 0.9 % IV BOLUS (SEPSIS)
250.0000 mL | Freq: Once | INTRAVENOUS | Status: AC
  Administered 2014-05-19: 250 mL via INTRAVENOUS

## 2014-05-19 NOTE — ED Provider Notes (Signed)
Natalie [name] 8:00 PM patient discussed and signed. Patient received dialysis and then began to feel lightheaded with near-syncope slight chest discomfort. Symptoms resolved while at rest. Patient did have orthostatic hypotension. Small slow bolus given. Workup unremarkable. Second troponin to rule out any ACS pending. Symptoms are atypical.  9:20 PM second troponin negative. Patient continues to appear well she is stable for discharge at this time.  [name] [name], PA-C 05/19/14 2122

## 2014-05-19 NOTE — ED Notes (Signed)
IV team states that there are two people ahead of this pt. Pt not sent to xray dt still having accessed fistula. NP Dondra SpryGail made aware.

## 2014-05-19 NOTE — ED Notes (Signed)
Pt presents via EMS from dialysis center. Pt completed dialysis treatment and upon standing, experienced dizziness and central chest pain/pressure. AV fistula remains accessed. EMS gave 324 ASA and 2 nitro with some relief of CP. Pt also c/o cramping in legs.

## 2014-05-19 NOTE — ED Notes (Signed)
IV team at bedside de accessing IV.

## 2014-05-19 NOTE — ED Notes (Signed)
Contacted IV team about removing accessed fistula.

## 2014-05-19 NOTE — ED Notes (Signed)
Pt c/o generalized cramping

## 2014-05-19 NOTE — ED Notes (Signed)
AV fistula still accessed from dialysis. IV team paged.

## 2014-05-19 NOTE — Discharge Instructions (Signed)
Your laboratory testing has not shown any signs of an emergent condition today. Please followup with your doctors for continued evaluation and treatment.    Hipotensin ortosttica (Orthostatic Hypotension) La hipotensin ortosttica es la cada sbita de la presin arterial. que sucede al ponerse de pie rpidamente despus de haber estado sentado o acostado. Puede sentirse mareado o aturdido. Esto puede durar unos pocos segundos o unos minutos. Generalmente, no es un problema grave. Sin embargo, si sucede con frecuencia o empeora, puede ser un signo de algo ms grave. CAUSAS  [employer] cosas que pueden causar hipotensin ortosttica, se incluyen:   Prdida de lquidos corporales (deshidratacin).  Medicamentos que disminuyen la presin arterial.  Cambios repentinos en la postura, por ejemplo, ponerse de pie rpidamente despus de haber estado sentado o acostado.  Consumo excesivo de medicamentos. SIGNOS Y SNTOMAS   Mareos o aturdimiento.  Desmayos o [employer].  Frecuencia cardaca acelerada.  Debilidad.  Sensacin de cansancio (fatiga). DIAGNSTICO  El mdico puede hacer diversas cosas para ayudar a Scientist, forensic y a Sales executive. Estas pueden incluir:   Preguntar los antecedentes mdicos y Radio producer un examen fsico.  Controlar la presin arterial. El mdico le controlar la presin arterial cuando usted est:  Acostado.  Sentado.  De pie.  Acostado en una mesa basculante. En esta prueba, se acostar en una mesa que pasa de una posicin horizontal a una posicin vertical. Lo sujetarn a la mesa. Esta prueba mide la presin arterial y la frecuencia cardaca en diferentes posiciones. TRATAMIENTO  El tratamiento depender de la causa. Los tratamientos posibles incluyen lo siguiente:   Equities trader de los medicamentos.  Usar medias de compresin en la parte inferior de las piernas.  Ponerse de pie lentamente despus de haber  estado sentado o acostado.  Consumir ms sal.  Hacer comidas pequeas y frecuentes.  En algunos casos, recibir lquido por va intravenosa (IV).  Tomar medicamentos para mejorar la retencin de lquido. INSTRUCCIONES PARA EL CUIDADO EN EL HOGAR  Tome los medicamentos de venta libre o recetados solamente segn las indicaciones del mdico.  Siga las instrucciones del mdico para modificar la dosificacin de sus medicamentos actuales.  No deje de tomar los medicamentos ni modifique la dosis por su cuenta.  Pngase de pie lentamente despus de haber estado sentado o acostado. Esto permite que el cuerpo se adapte a Designer, television/film set.  Use las medias de compresin segn las indicaciones.  Consuma la sal adicional segn las indicaciones.  No agregue sal adicional a su dieta si no se lo indica el mdico.  Haga comidas pequeas y frecuentes.  Evite ponerse de pie de repente despus de comer.  Evite las duchas calientes o el calor excesivo segn las indicaciones del mdico.  Cumpla con todas las visitas de control. SOLICITE ATENCIN MDICA SI:  Sigue sintindose mareado o aturdido despus de haberse puesto de pie.  Se siente atontado o confundido.  Tiene fro, sudoracin fra o Programme researcher, broadcasting/film/video (nuseas).  Tiene visin borrosa.  Le falta el aire. SOLICITE ATENCIN MDICA DE INMEDIATO SI:   Se desmaya despus de ponerse de pie.  Siente dolor en el pecho.  Tiene dificultad para respirar.  Pierde la sensibilidad o el movimiento en los brazos o en las piernas.  Tiene problemas para articular las palabras o dificultad para hablar, o no puede hablar. ASEGRESE DE QUE:   Comprende estas instrucciones.  Controlar su afeccin.  Recibir ayuda de inmediato si no mejora o si empeora.  Document Released: 07/26/2005 Document Revised: 10/21/2013 [facility] Patient Information 2015 [employer] [place]. This information is not intended to replace advice given to you by your  health care provider. Make sure you discuss any questions you have with your health care provider.

## 2014-05-19 NOTE — ED Provider Notes (Signed)
CSN: [id number]     Arrival date & time 05/19/14  1720 History   First MD Initiated Contact with Patient 05/19/14 1725     Chief Complaint  Patient presents with  . Chest Pain  . Dizziness     (Consider location/radiation/quality/duration/timing/severity/associated sxs/prior Treatment) HPI Comments: Patient states, with the aid of an interpreter, that after her dialysis session was completed she stood up, and became dizzy.  This is not unusual for her.  This today.  This was associated with shortness of breath, nausea, and chest pain.  That radiated into bilateral jaw.  Initially, it was 10 out of 10 pain.  On arrival to the emergency room and has weaned to 2, but is still present.  She has no prior history of pain, similar to this. She states she has not missed any of her medication and she has had regular dialysis session.  Patient is a 43 y.o. female presenting with chest pain and dizziness. The history is provided by the patient.  Chest Pain Pain location:  Substernal area Pain quality: aching   Pain radiates to:  L jaw and R jaw Pain radiates to the back: no   Pain severity:  Severe Onset quality:  Sudden Timing:  Constant Progression:  Improving Chronicity:  New Context: not breathing and not at rest   Relieved by:  Rest Worsened by:  Nothing tried Ineffective treatments:  None tried Associated symptoms: dizziness, nausea, near-syncope and shortness of breath   Associated symptoms: no diaphoresis, no palpitations and no syncope   Dizziness Associated symptoms: chest pain, nausea and shortness of breath   Associated symptoms: no palpitations and no syncope     Past Medical History  Diagnosis Date  . Renal disorder     ESRD  . Diabetes mellitus     Type II  . Anemia   . Thyroid disease     Hyperpara-thyroidism  . DVT (deep venous thrombosis)   . Hypertension   . GERD (gastroesophageal reflux disease)   . Depression   . Seizures     as a child  . Complication of  anesthesia   . PONV (postoperative nausea and vomiting)   . Atrial fibrillation    Past Surgical History  Procedure Laterality Date  . Av fistula placement Right 05/26/2013    Procedure: Right Brachiocephalic  ARTERIOVENOUS (AV) FISTULA CREATION;  Surgeon: Fransisco Hertz, MD;  Location: [facility] OR;  Service: Vascular;  Laterality: Right;  . Hemodialysis cath Left   . Revison of arteriovenous fistula Right 11/19/2013    Procedure: SUPERFICIALIZATION OF RIGHT BRACHIOCEPHALIC ARTERIOVENOUS FISTULA WITH SIDE BRANCH LIGATION; ULTRASOUND GUIDED;  Surgeon: Fransisco Hertz, MD;  Location: [facility] OR;  Service: Vascular;  Laterality: Right;   Family History  Problem Relation Age of Onset  . Heart attack Mother   . Heart disease Mother     Heart Disease before age 78  . Diabetes Sister   . Kidney disease Father    History  Substance Use Topics  . Smoking status: Never Smoker   . Smokeless tobacco: Never Used  . Alcohol Use: No   OB History   Grav Para Term Preterm Abortions TAB SAB Ect Mult Living                 Review of Systems  Constitutional: Negative for diaphoresis.  Respiratory: Positive for shortness of breath.   Cardiovascular: Positive for chest pain and near-syncope. Negative for palpitations and syncope.  Gastrointestinal: Positive for nausea.  Neurological: Positive for dizziness.      Allergies  Review of patient's allergies indicates no known allergies.  Home Medications   Prior to Admission medications   Medication Sig Start Date End Date Taking? Authorizing Provider  amLODipine (NORVASC) 10 MG tablet Take 10 mg by mouth daily.    Historical Provider, MD  Calcium Carbonate Antacid (TUMS PO) Take 200 mg by mouth 3 (three) times daily.    Historical Provider, MD  insulin lispro (HUMALOG) 100 UNIT/ML injection Inject 2-10 Units into the skin 3 (three) times daily before meals.    Historical Provider, MD  lisinopril (PRINIVIL,ZESTRIL) 40 MG tablet Take 40 mg by mouth daily.     Historical Provider, MD  oxyCODONE (ROXICODONE) 5 MG immediate release tablet Take 1 tablet (5 mg total) by mouth every 4 (four) hours as needed for severe pain. 12/10/13   Carma LairSuzanne L Nickel, NP   BP 146/83  Pulse 81  Resp 18  SpO2 99% Physical Exam  ED Course  Procedures (including critical care time) Labs Review Labs Reviewed  CBC WITH DIFFERENTIAL - Abnormal; Notable for the following:    WBC 13.7 (*)    Platelets 406 (*)    Neutrophils Relative % 81 (*)    Neutro Abs 11.0 (*)    Lymphocytes Relative 11 (*)    All other components within normal limits  I-STAT CHEM 8, ED - Abnormal; Notable for the following:    Potassium 3.3 (*)    Creatinine, Ser 3.60 (*)    Glucose, Bld 149 (*)    Calcium, Ion 0.96 (*)    All other components within normal limits  TROPONIN I  TROPONIN I    Imaging Review Dg Chest 2 View  05/19/2014   CLINICAL DATA:  Pain under right breast after dialysis today.  EXAM: CHEST  2 VIEW  COMPARISON:  05/26/2013 radiographs.  FINDINGS: Dialysis catheter has been removed. There are low lung volumes with asymmetric elevation of the right hemidiaphragm. There is mild bibasilar atelectasis and chronic vascular congestion. No edema, confluent airspace opacity or pleural effusion is present. There is mild chronic central airway thickening. The osseous structures appear stable.  IMPRESSION: No acute cardiopulmonary process demonstrated. Stable bibasilar atelectasis and vascular congestion.   Electronically Signed   By: Roxy HorsemanBill  Veazey M.D.   On: 05/19/2014 19:57     EKG Interpretation   Date/Time:  Tuesday May 19 2014 17:27:49 EDT Ventricular Rate:  89 PR Interval:  159 QRS Duration: 90 QT Interval:  423 QTC Calculation: 515 R Axis:   60 Text Interpretation:  Sinus rhythm Left atrial enlargement LVH with  secondary repolarization abnormality Prolonged QT interval similar to  prior Confirmed by HORTON  MD, COURTNEY ([id number]) on 05/19/2014 8:03:43 PM      Date:  05/19/2014  Rate: 89  Rhythm: normal sinus rhythm  QRS Axis: normal  Intervals: normal  ST/T Wave abnormalities: normal  Conduction Disutrbances:none  Narrative Interpretation: Left atrial enlargement   Old EKG Reviewed: none   MDM  Patient is orthostatic after 250 cc bolus of IV fluid.  She will be given an additional 250 cc IV.  He is currently pain-free.  Am awaiting chest x-ray  Patients HEART score is 1  Will repeat Troponin if negative will DC home  Final diagnoses:  Orthostatic hypotension        Arman FilterGail K Josanne Boerema, NP 05/19/14 2006  Arman FilterGail K Tinita Brooker, NP 05/21/14 [phone]

## 2014-05-19 NOTE — ED Notes (Signed)
Pt placed in gown and in bed. Pt monitored by pulse ox, bp cuff, and 12-lead. 

## 2014-05-19 NOTE — ED Notes (Signed)
Repaged IV team.

## 2014-05-20 NOTE — ED Provider Notes (Signed)
Medical screening examination/treatment/procedure(s) were performed by non-physician practitioner and as supervising physician I was immediately available for consultation/collaboration.   EKG Interpretation   Date/Time:  Tuesday May 19 2014 17:27:49 EDT Ventricular Rate:  89 PR Interval:  159 QRS Duration: 90 QT Interval:  423 QTC Calculation: 515 R Axis:   60 Text Interpretation:  Sinus rhythm Left atrial enlargement LVH with  secondary repolarization abnormality Prolonged QT interval similar to  prior Confirmed by Wilkie AyeHORTON  MD, COURTNEY ([id number]) on 05/19/2014 8:03:43 PM       Derwood KaplanAnkit Marianny Goris, MD 05/20/14 0330

## 2014-05-21 NOTE — ED Provider Notes (Signed)
Medical screening examination/treatment/procedure(s) were performed by non-physician practitioner and as supervising physician I was immediately available for consultation/collaboration.   EKG Interpretation   Date/Time:  Tuesday May 19 2014 17:27:49 EDT Ventricular Rate:  89 PR Interval:  159 QRS Duration: 90 QT Interval:  423 QTC Calculation: 515 R Axis:   60 Text Interpretation:  Sinus rhythm Left atrial enlargement LVH with  secondary repolarization abnormality Prolonged QT interval similar to  prior Confirmed by Wilkie AyeHORTON  MD, Daeton Kluth ([id number]) on 05/19/2014 8:03:43 PM        Shon Batonourtney F Birtha Hatler, MD 05/21/14 [phone]

## 2014-09-01 ENCOUNTER — Other Ambulatory Visit (HOSPITAL_COMMUNITY): Payer: Self-pay | Admitting: Nephrology

## 2014-09-01 DIAGNOSIS — Z992 Dependence on renal dialysis: Principal | ICD-10-CM

## 2014-09-01 DIAGNOSIS — N186 End stage renal disease: Secondary | ICD-10-CM

## 2014-09-02 ENCOUNTER — Other Ambulatory Visit (HOSPITAL_COMMUNITY): Payer: Self-pay | Admitting: Nephrology

## 2014-09-02 ENCOUNTER — Ambulatory Visit (HOSPITAL_COMMUNITY)
Admission: RE | Admit: 2014-09-02 | Discharge: 2014-09-02 | Disposition: A | Discharge status: Home / Self Care | Payer: Medicaid Other | Source: Ambulatory Visit | Attending: Nephrology | Admitting: Nephrology

## 2014-09-02 DIAGNOSIS — Z992 Dependence on renal dialysis: Principal | ICD-10-CM

## 2014-09-02 DIAGNOSIS — N186 End stage renal disease: Secondary | ICD-10-CM

## 2014-09-02 DIAGNOSIS — Y832 Surgical operation with anastomosis, bypass or graft as the cause of abnormal reaction of the patient, or of later complication, without mention of misadventure at the time of the procedure: Secondary | ICD-10-CM | POA: Insufficient documentation

## 2014-09-02 DIAGNOSIS — T82858A Stenosis of vascular prosthetic devices, implants and grafts, initial encounter: Secondary | ICD-10-CM | POA: Insufficient documentation

## 2014-09-02 MED ORDER — IOHEXOL 300 MG/ML  SOLN
100.0000 mL | Freq: Once | INTRAMUSCULAR | Status: AC | PRN
  Administered 2014-09-02: 64 mL via INTRAVENOUS

## 2014-09-02 NOTE — Procedures (Signed)
Dx:  ESRD Procedure:  Fistulogram with angioplasty Findings:  AV fistula outflow shows significant stenosis of central cephalic vein. Treated with 6 mm angioplasty with good result.  Post Op Dx:  ESRD; venous stenosis

## 2014-09-04 ENCOUNTER — Ambulatory Visit (HOSPITAL_COMMUNITY): Payer: Self-pay

## 2014-11-12 ENCOUNTER — Encounter (HOSPITAL_COMMUNITY): Payer: Self-pay | Admitting: Vascular Surgery

## 2015-01-23 ENCOUNTER — Inpatient Hospital Stay (HOSPITAL_COMMUNITY)
Admission: EM | Admit: 2015-01-23 | Discharge: 2015-02-01 | DRG: 037 | Disposition: A | Discharge status: Rehabilitation Facility | Payer: Medicaid Other | Attending: Internal Medicine | Admitting: Internal Medicine

## 2015-01-23 ENCOUNTER — Encounter (HOSPITAL_COMMUNITY): Payer: Self-pay | Admitting: Emergency Medicine

## 2015-01-23 ENCOUNTER — Emergency Department (HOSPITAL_COMMUNITY): Payer: Medicaid Other

## 2015-01-23 DIAGNOSIS — T82590A Other mechanical complication of surgically created arteriovenous fistula, initial encounter: Secondary | ICD-10-CM

## 2015-01-23 DIAGNOSIS — Z8249 Family history of ischemic heart disease and other diseases of the circulatory system: Secondary | ICD-10-CM | POA: Diagnosis not present

## 2015-01-23 DIAGNOSIS — D72829 Elevated white blood cell count, unspecified: Secondary | ICD-10-CM | POA: Diagnosis present

## 2015-01-23 DIAGNOSIS — R05 Cough: Secondary | ICD-10-CM

## 2015-01-23 DIAGNOSIS — E875 Hyperkalemia: Secondary | ICD-10-CM | POA: Diagnosis present

## 2015-01-23 DIAGNOSIS — D638 Anemia in other chronic diseases classified elsewhere: Secondary | ICD-10-CM | POA: Diagnosis present

## 2015-01-23 DIAGNOSIS — Z9114 Patient's other noncompliance with medication regimen: Secondary | ICD-10-CM | POA: Diagnosis present

## 2015-01-23 DIAGNOSIS — E039 Hypothyroidism, unspecified: Secondary | ICD-10-CM | POA: Diagnosis present

## 2015-01-23 DIAGNOSIS — R531 Weakness: Secondary | ICD-10-CM | POA: Diagnosis present

## 2015-01-23 DIAGNOSIS — R471 Dysarthria and anarthria: Secondary | ICD-10-CM | POA: Diagnosis present

## 2015-01-23 DIAGNOSIS — N390 Urinary tract infection, site not specified: Secondary | ICD-10-CM | POA: Diagnosis present

## 2015-01-23 DIAGNOSIS — N186 End stage renal disease: Secondary | ICD-10-CM | POA: Diagnosis present

## 2015-01-23 DIAGNOSIS — I739 Peripheral vascular disease, unspecified: Secondary | ICD-10-CM | POA: Diagnosis present

## 2015-01-23 DIAGNOSIS — D649 Anemia, unspecified: Secondary | ICD-10-CM | POA: Diagnosis present

## 2015-01-23 DIAGNOSIS — G8191 Hemiplegia, unspecified affecting right dominant side: Secondary | ICD-10-CM | POA: Diagnosis present

## 2015-01-23 DIAGNOSIS — N2581 Secondary hyperparathyroidism of renal origin: Secondary | ICD-10-CM | POA: Diagnosis present

## 2015-01-23 DIAGNOSIS — E118 Type 2 diabetes mellitus with unspecified complications: Secondary | ICD-10-CM

## 2015-01-23 DIAGNOSIS — I120 Hypertensive chronic kidney disease with stage 5 chronic kidney disease or end stage renal disease: Secondary | ICD-10-CM | POA: Diagnosis present

## 2015-01-23 DIAGNOSIS — F09 Unspecified mental disorder due to known physiological condition: Secondary | ICD-10-CM

## 2015-01-23 DIAGNOSIS — I959 Hypotension, unspecified: Secondary | ICD-10-CM | POA: Diagnosis not present

## 2015-01-23 DIAGNOSIS — I82611 Acute embolism and thrombosis of superficial veins of right upper extremity: Secondary | ICD-10-CM | POA: Diagnosis present

## 2015-01-23 DIAGNOSIS — R1310 Dysphagia, unspecified: Secondary | ICD-10-CM | POA: Diagnosis present

## 2015-01-23 DIAGNOSIS — Z8673 Personal history of transient ischemic attack (TIA), and cerebral infarction without residual deficits: Secondary | ICD-10-CM | POA: Diagnosis not present

## 2015-01-23 DIAGNOSIS — R270 Ataxia, unspecified: Secondary | ICD-10-CM | POA: Diagnosis present

## 2015-01-23 DIAGNOSIS — J690 Pneumonitis due to inhalation of food and vomit: Secondary | ICD-10-CM

## 2015-01-23 DIAGNOSIS — Z992 Dependence on renal dialysis: Secondary | ICD-10-CM

## 2015-01-23 DIAGNOSIS — E871 Hypo-osmolality and hyponatremia: Secondary | ICD-10-CM | POA: Diagnosis present

## 2015-01-23 DIAGNOSIS — I6302 Cerebral infarction due to thrombosis of basilar artery: Secondary | ICD-10-CM

## 2015-01-23 DIAGNOSIS — R109 Unspecified abdominal pain: Secondary | ICD-10-CM

## 2015-01-23 DIAGNOSIS — I639 Cerebral infarction, unspecified: Secondary | ICD-10-CM | POA: Diagnosis present

## 2015-01-23 DIAGNOSIS — R059 Cough, unspecified: Secondary | ICD-10-CM

## 2015-01-23 DIAGNOSIS — E785 Hyperlipidemia, unspecified: Secondary | ICD-10-CM | POA: Diagnosis present

## 2015-01-23 DIAGNOSIS — E119 Type 2 diabetes mellitus without complications: Secondary | ICD-10-CM

## 2015-01-23 DIAGNOSIS — E1142 Type 2 diabetes mellitus with diabetic polyneuropathy: Secondary | ICD-10-CM | POA: Diagnosis present

## 2015-01-23 DIAGNOSIS — R29810 Facial weakness: Secondary | ICD-10-CM | POA: Diagnosis present

## 2015-01-23 HISTORY — DX: Type 2 diabetes mellitus without complications: E11.9

## 2015-01-23 HISTORY — DX: Anemia in other chronic diseases classified elsewhere: D63.8

## 2015-01-23 HISTORY — DX: Secondary hyperparathyroidism of renal origin: N25.81

## 2015-01-23 HISTORY — DX: End stage renal disease: N18.6

## 2015-01-23 HISTORY — DX: Hyperlipidemia, unspecified: E78.5

## 2015-01-23 HISTORY — DX: Dependence on renal dialysis: Z99.2

## 2015-01-23 LAB — COMPREHENSIVE METABOLIC PANEL
ALT: 23 U/L (ref 0–35)
AST: 25 U/L (ref 0–37)
Albumin: 3.8 g/dL (ref 3.5–5.2)
Alkaline Phosphatase: 115 U/L (ref 39–117)
Anion gap: 18 — ABNORMAL HIGH (ref 5–15)
BUN: 43 mg/dL — ABNORMAL HIGH (ref 6–23)
CO2: 22 mmol/L (ref 19–32)
Calcium: 9.8 mg/dL (ref 8.4–10.5)
Chloride: 94 mmol/L — ABNORMAL LOW (ref 96–112)
Creatinine, Ser: 7.21 mg/dL — ABNORMAL HIGH (ref 0.50–1.10)
GFR calc Af Amer: 7 mL/min — ABNORMAL LOW (ref >90)
GFR calc non Af Amer: 6 mL/min — ABNORMAL LOW (ref >90)
Glucose, Bld: 152 mg/dL — ABNORMAL HIGH (ref 70–99)
Potassium: 4.5 mmol/L (ref 3.5–5.1)
Sodium: 134 mmol/L — ABNORMAL LOW (ref 135–145)
TOTAL PROTEIN: 8.2 g/dL (ref 6.0–8.3)
Total Bilirubin: 0.7 mg/dL (ref 0.3–1.2)

## 2015-01-23 LAB — RAPID URINE DRUG SCREEN, HOSP PERFORMED
AMPHETAMINES: NOT DETECTED
Amphetamines: NOT DETECTED
BARBITURATES: NOT DETECTED
Barbiturates: NOT DETECTED
Benzodiazepines: NOT DETECTED
Benzodiazepines: NOT DETECTED
COCAINE: NOT DETECTED
Cocaine: NOT DETECTED
OPIATES: NOT DETECTED
Opiates: NOT DETECTED
TETRAHYDROCANNABINOL: NOT DETECTED
Tetrahydrocannabinol: NOT DETECTED

## 2015-01-23 LAB — CBC WITH DIFFERENTIAL/PLATELET
BASOS ABS: 0.1 10*3/uL (ref 0.0–0.1)
BASOS PCT: 1 % (ref 0–1)
EOS PCT: 3 % (ref 0–5)
Eosinophils Absolute: 0.4 10*3/uL (ref 0.0–0.7)
HCT: 37.6 % (ref 36.0–46.0)
Hemoglobin: 12.7 g/dL (ref 12.0–15.0)
Lymphocytes Relative: 22 % (ref 12–46)
Lymphs Abs: 2.9 10*3/uL (ref 0.7–4.0)
MCH: 32.2 pg (ref 26.0–34.0)
MCHC: 33.8 g/dL (ref 30.0–36.0)
MCV: 95.4 fL (ref 78.0–100.0)
MONO ABS: 0.7 10*3/uL (ref 0.1–1.0)
MONOS PCT: 6 % (ref 3–12)
NEUTROS ABS: 8.9 10*3/uL — AB (ref 1.7–7.7)
Neutrophils Relative %: 68 % (ref 43–77)
Platelets: 464 10*3/uL — ABNORMAL HIGH (ref 150–400)
RBC: 3.94 MIL/uL (ref 3.87–5.11)
RDW: 16 % — ABNORMAL HIGH (ref 11.5–15.5)
WBC: 13 10*3/uL — ABNORMAL HIGH (ref 4.0–10.5)

## 2015-01-23 LAB — URINALYSIS, ROUTINE W REFLEX MICROSCOPIC
Bilirubin Urine: NEGATIVE
Glucose, UA: 100 mg/dL — AB
KETONES UR: NEGATIVE mg/dL
Nitrite: NEGATIVE
Protein, ur: 30 mg/dL — AB
Specific Gravity, Urine: 1.01 (ref 1.005–1.030)
Urobilinogen, UA: 0.2 mg/dL (ref 0.0–1.0)
pH: 7 (ref 5.0–8.0)

## 2015-01-23 LAB — CBG MONITORING, ED
Glucose-Capillary: 64 mg/dL — ABNORMAL LOW (ref 70–99)
Glucose-Capillary: 180 mg/dL — ABNORMAL HIGH (ref 70–99)

## 2015-01-23 LAB — I-STAT CHEM 8, ED
BUN: 41 mg/dL — ABNORMAL HIGH (ref 6–23)
CALCIUM ION: 1.1 mmol/L — AB (ref 1.12–1.23)
CHLORIDE: 99 mmol/L (ref 96–112)
Creatinine, Ser: 7.3 mg/dL — ABNORMAL HIGH (ref 0.50–1.10)
Glucose, Bld: 159 mg/dL — ABNORMAL HIGH (ref 70–99)
HEMATOCRIT: 40 % (ref 36.0–46.0)
Hemoglobin: 13.6 g/dL (ref 12.0–15.0)
Potassium: 4.5 mmol/L (ref 3.5–5.1)
Sodium: 133 mmol/L — ABNORMAL LOW (ref 135–145)
TCO2: 20 mmol/L (ref 0–100)

## 2015-01-23 LAB — URINE MICROSCOPIC-ADD ON

## 2015-01-23 LAB — PROTIME-INR
INR: 1.03 (ref 0.00–1.49)
PROTHROMBIN TIME: 13.6 s (ref 11.6–15.2)

## 2015-01-23 LAB — I-STAT TROPONIN, ED: Troponin i, poc: 0.01 ng/mL (ref 0.00–0.08)

## 2015-01-23 LAB — TROPONIN I

## 2015-01-23 LAB — ETHANOL

## 2015-01-23 MED ORDER — SODIUM CHLORIDE 0.9 % IV SOLN: 250.0000 mL | INTRAVENOUS | Status: DC | PRN

## 2015-01-23 MED ORDER — AMLODIPINE BESYLATE 10 MG PO TABS
10.0000 mg | ORAL_TABLET | Freq: Every day | ORAL | Status: DC
  Administered 2015-01-24 – 2015-01-27 (×4): 10 mg via ORAL
  Filled 2015-01-23 (×4): qty 1

## 2015-01-23 MED ORDER — CIPROFLOXACIN HCL 500 MG PO TABS: 500.0000 mg | ORAL_TABLET | ORAL | Status: DC

## 2015-01-23 MED ORDER — CIPROFLOXACIN HCL 500 MG PO TABS: 500.0000 mg | ORAL_TABLET | Freq: Two times a day (BID) | ORAL | Status: DC

## 2015-01-23 MED ORDER — ONDANSETRON HCL 4 MG/2ML IJ SOLN
4.0000 mg | Freq: Once | INTRAMUSCULAR | Status: AC
  Administered 2015-01-23: 4 mg via INTRAVENOUS
  Filled 2015-01-23: qty 2

## 2015-01-23 MED ORDER — ATORVASTATIN CALCIUM 80 MG PO TABS
80.0000 mg | ORAL_TABLET | Freq: Every day | ORAL | Status: DC
  Administered 2015-01-24 – 2015-01-31 (×8): 80 mg via ORAL
  Filled 2015-01-23 (×8): qty 1

## 2015-01-23 MED ORDER — LISINOPRIL 20 MG PO TABS
40.0000 mg | ORAL_TABLET | Freq: Every day | ORAL | Status: DC
  Administered 2015-01-24 – 2015-01-25 (×2): 40 mg via ORAL
  Filled 2015-01-23: qty 4
  Filled 2015-01-23: qty 2

## 2015-01-23 MED ORDER — SODIUM CHLORIDE 0.9 % IJ SOLN
3.0000 mL | Freq: Two times a day (BID) | INTRAMUSCULAR | Status: DC
  Administered 2015-01-24 – 2015-02-01 (×13): 3 mL via INTRAVENOUS

## 2015-01-23 MED ORDER — STROKE: EARLY STAGES OF RECOVERY BOOK
Freq: Once | Status: AC
  Administered 2015-01-23: 23:00:00

## 2015-01-23 MED ORDER — DEXTROSE 50 % IV SOLN
12.5000 g | Freq: Once | INTRAVENOUS | Status: AC
  Administered 2015-01-23: 12.5 g via INTRAVENOUS
  Filled 2015-01-23: qty 50

## 2015-01-23 MED ORDER — INSULIN ASPART 100 UNIT/ML ~~LOC~~ SOLN
0.0000 [IU] | Freq: Every day | SUBCUTANEOUS | Status: DC
  Administered 2015-01-24: 3 [IU] via SUBCUTANEOUS
  Administered 2015-01-25 – 2015-01-29 (×2): 2 [IU] via SUBCUTANEOUS

## 2015-01-23 MED ORDER — INSULIN ASPART 100 UNIT/ML ~~LOC~~ SOLN
0.0000 [IU] | Freq: Three times a day (TID) | SUBCUTANEOUS | Status: DC
Start: 2015-01-24 — End: 2015-02-01
  Administered 2015-01-24: 2 [IU] via SUBCUTANEOUS
  Administered 2015-01-24 – 2015-01-25 (×3): 1 [IU] via SUBCUTANEOUS
  Administered 2015-01-25: 3 [IU] via SUBCUTANEOUS
  Administered 2015-01-26: 1 [IU] via SUBCUTANEOUS
  Administered 2015-01-26: 3 [IU] via SUBCUTANEOUS
  Administered 2015-01-26: 5 [IU] via SUBCUTANEOUS
  Administered 2015-01-27 (×2): 2 [IU] via SUBCUTANEOUS
  Administered 2015-01-28: 1 [IU] via SUBCUTANEOUS
  Administered 2015-01-29: 3 [IU] via SUBCUTANEOUS
  Administered 2015-01-29: 5 [IU] via SUBCUTANEOUS
  Administered 2015-01-29: 2 [IU] via SUBCUTANEOUS
  Administered 2015-01-30: 1 [IU] via SUBCUTANEOUS
  Administered 2015-01-30: 2 [IU] via SUBCUTANEOUS
  Administered 2015-01-31: 5 [IU] via SUBCUTANEOUS
  Administered 2015-01-31: 1 [IU] via SUBCUTANEOUS
  Administered 2015-02-01: 3 [IU] via SUBCUTANEOUS

## 2015-01-23 MED ORDER — ASPIRIN 325 MG PO TABS
325.0000 mg | ORAL_TABLET | Freq: Every day | ORAL | Status: DC
  Administered 2015-01-24 – 2015-01-25 (×2): 325 mg via ORAL
  Filled 2015-01-23 (×2): qty 1

## 2015-01-23 MED ORDER — SEVELAMER CARBONATE 800 MG PO TABS
800.0000 mg | ORAL_TABLET | Freq: Two times a day (BID) | ORAL | Status: DC
  Administered 2015-01-24 – 2015-01-26 (×5): 800 mg via ORAL
  Filled 2015-01-23 (×5): qty 1

## 2015-01-23 MED ORDER — DEXTROSE-NACL 5-0.9 % IV SOLN
INTRAVENOUS | Status: AC
  Administered 2015-01-23: 21:00:00 via INTRAVENOUS

## 2015-01-23 MED ORDER — ASPIRIN 300 MG RE SUPP
300.0000 mg | Freq: Every day | RECTAL | Status: DC
  Administered 2015-01-23: 300 mg via RECTAL
  Filled 2015-01-23: qty 1

## 2015-01-23 MED ORDER — SODIUM CHLORIDE 0.9 % IJ SOLN
3.0000 mL | INTRAMUSCULAR | Status: DC | PRN
  Administered 2015-01-31: 3 mL via INTRAVENOUS
  Filled 2015-01-23: qty 3

## 2015-01-23 NOTE — ED Notes (Signed)
Admitting physician paged x2 for pt abnormal CBG.

## 2015-01-23 NOTE — H&P (Signed)
Natalie [name] is an 44 y.o. female.     Pcp: ???  Chief Complaint:  HPI:   44 year old female with a past medical history of hypertension, diabetes, and end-stage renal disease who presents to the emergency department with difficulty with speech, numbness, difficulty swallowing and nausea. The patient is Spanish-speaking and was having confusion.  According to the patient yesterday around 10 AM she began feeling very nauseous but had no vomiting. The patient states that about 12:00, she began feeling extremely weak all over, she said she had difficulty swallowing but denies any throat pain, difficulty speaking. She complains of numbness from the mouth down the front of her throat. Pt denies  unilateral weakness, changes in vision. 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  Pt was apparently seen by neurology Dr. Nicole Kindred who requested medical admission for CVA.   Past Medical History  Diagnosis Date  . Hypertension   . Diabetes mellitus without complication   . Renal disorder     Past Surgical History  Procedure Laterality Date  . Insertion of dialysis catheter      History reviewed. No pertinent family history.    Mother had heart attack   Social History:  reports that she has never smoked. She does not have any smokeless tobacco history on file. She reports that she does not drink alcohol or use illicit drugs.  Allergies: No Known Allergies Medications reviewed  (Not in a hospital admission)  Results for orders placed or performed during the hospital encounter of 01/23/15 (from the past 48 hour(s))  Ethanol     Status: None   Collection Time: 01/23/15 11:50 AM  Result Value Ref Range   Alcohol, Ethyl (B) <5 0 - 9 mg/dL    Comment:        LOWEST DETECTABLE LIMIT FOR SERUM ALCOHOL IS 11 mg/dL FOR MEDICAL PURPOSES ONLY   Protime-INR     Status: None   Collection Time: 01/23/15 11:50 AM  Result Value Ref Range   Prothrombin Time 13.6 11.6 -  15.2 seconds   INR 1.03 0.00 - 1.49  Urine Drug Screen     Status: None   Collection Time: 01/23/15 11:50 AM  Result Value Ref Range   Opiates NONE DETECTED NONE DETECTED   Cocaine NONE DETECTED NONE DETECTED   Benzodiazepines NONE DETECTED NONE DETECTED   Amphetamines NONE DETECTED NONE DETECTED   Tetrahydrocannabinol NONE DETECTED NONE DETECTED   Barbiturates NONE DETECTED NONE DETECTED    Comment:        DRUG SCREEN FOR MEDICAL PURPOSES ONLY.  IF CONFIRMATION IS NEEDED FOR ANY PURPOSE, NOTIFY LAB WITHIN 5 DAYS.        LOWEST DETECTABLE LIMITS FOR URINE DRUG SCREEN Drug Class       Cutoff (ng/mL) Amphetamine      1000 Barbiturate      200 Benzodiazepine   945 Tricyclics       038 Opiates          300 Cocaine          300 THC              50   Comprehensive metabolic panel     Status: Abnormal   Collection Time: 01/23/15 11:50 AM  Result Value Ref Range   Sodium 134 (L) 135 - 145 mmol/L   Potassium 4.5 3.5 - 5.1 mmol/L   Chloride 94 (L) 96 - 112 mmol/L   CO2 22  19 - 32 mmol/L   Glucose, Bld 152 (H) 70 - 99 mg/dL   BUN 43 (H) 6 - 23 mg/dL   Creatinine, Ser 7.21 (H) 0.50 - 1.10 mg/dL   Calcium 9.8 8.4 - 10.5 mg/dL   Total Protein 8.2 6.0 - 8.3 g/dL   Albumin 3.8 3.5 - 5.2 g/dL   AST 25 0 - 37 U/L   ALT 23 0 - 35 U/L   Alkaline Phosphatase 115 39 - 117 U/L   Total Bilirubin 0.7 0.3 - 1.2 mg/dL   GFR calc non Af Amer 6 (L) >90 mL/min   GFR calc Af Amer 7 (L) >90 mL/min    Comment: (NOTE) The eGFR has been calculated using the CKD EPI equation. This calculation has not been validated in all clinical situations. eGFR's persistently <90 mL/min signify possible Chronic Kidney Disease.    Anion gap 18 (H) 5 - 15  CBC WITH DIFFERENTIAL     Status: Abnormal   Collection Time: 01/23/15 11:50 AM  Result Value Ref Range   WBC 13.0 (H) 4.0 - 10.5 K/uL   RBC 3.94 3.87 - 5.11 MIL/uL   Hemoglobin 12.7 12.0 - 15.0 g/dL   HCT 37.6 36.0 - 46.0 %   MCV 95.4 78.0 - 100.0 fL    MCH 32.2 26.0 - 34.0 pg   MCHC 33.8 30.0 - 36.0 g/dL   RDW 16.0 (H) 11.5 - 15.5 %   Platelets 464 (H) 150 - 400 K/uL   Neutrophils Relative % 68 43 - 77 %   Neutro Abs 8.9 (H) 1.7 - 7.7 K/uL   Lymphocytes Relative 22 12 - 46 %   Lymphs Abs 2.9 0.7 - 4.0 K/uL   Monocytes Relative 6 3 - 12 %   Monocytes Absolute 0.7 0.1 - 1.0 K/uL   Eosinophils Relative 3 0 - 5 %   Eosinophils Absolute 0.4 0.0 - 0.7 K/uL   Basophils Relative 1 0 - 1 %   Basophils Absolute 0.1 0.0 - 0.1 K/uL  Troponin I     Status: None   Collection Time: 01/23/15 11:50 AM  Result Value Ref Range   Troponin I <0.03 <0.031 ng/mL    Comment:        NO INDICATION OF MYOCARDIAL INJURY.   I-Stat Troponin, ED (not at [facility])     Status: None   Collection Time: 01/23/15 11:57 AM  Result Value Ref Range   Troponin i, poc 0.01 0.00 - 0.08 ng/mL   Comment 3            Comment: Due to the release kinetics of cTnI, a negative result within the first hours of the onset of symptoms does not rule out myocardial infarction with certainty. If myocardial infarction is still suspected, repeat the test at appropriate intervals.   I-Stat Chem 8, ED     Status: Abnormal   Collection Time: 01/23/15 11:59 AM  Result Value Ref Range   Sodium 133 (L) 135 - 145 mmol/L   Potassium 4.5 3.5 - 5.1 mmol/L   Chloride 99 96 - 112 mmol/L   BUN 41 (H) 6 - 23 mg/dL   Creatinine, Ser 7.30 (H) 0.50 - 1.10 mg/dL   Glucose, Bld 159 (H) 70 - 99 mg/dL   Calcium, Ion 1.10 (L) 1.12 - 1.23 mmol/L   TCO2 20 0 - 100 mmol/L   Hemoglobin 13.6 12.0 - 15.0 g/dL   HCT 40.0 36.0 - 46.0 %  Urinalysis, Routine w reflex microscopic  Status: Abnormal   Collection Time: 01/23/15 12:55 PM  Result Value Ref Range   Color, Urine YELLOW YELLOW   APPearance CLOUDY (A) CLEAR   Specific Gravity, Urine 1.010 1.005 - 1.030   pH 7.0 5.0 - 8.0   Glucose, UA 100 (A) NEGATIVE mg/dL   Hgb urine dipstick TRACE (A) NEGATIVE   Bilirubin Urine NEGATIVE NEGATIVE   Ketones,  ur NEGATIVE NEGATIVE mg/dL   Protein, ur 30 (A) NEGATIVE mg/dL   Urobilinogen, UA 0.2 0.0 - 1.0 mg/dL   Nitrite NEGATIVE NEGATIVE   Leukocytes, UA LARGE (A) NEGATIVE  Drug screen panel, emergency     Status: None   Collection Time: 01/23/15 12:55 PM  Result Value Ref Range   Opiates NONE DETECTED NONE DETECTED   Cocaine NONE DETECTED NONE DETECTED   Benzodiazepines NONE DETECTED NONE DETECTED   Amphetamines NONE DETECTED NONE DETECTED   Tetrahydrocannabinol NONE DETECTED NONE DETECTED   Barbiturates NONE DETECTED NONE DETECTED    Comment:        DRUG SCREEN FOR MEDICAL PURPOSES ONLY.  IF CONFIRMATION IS NEEDED FOR ANY PURPOSE, NOTIFY LAB WITHIN 5 DAYS.        LOWEST DETECTABLE LIMITS FOR URINE DRUG SCREEN Drug Class       Cutoff (ng/mL) Amphetamine      1000 Barbiturate      200 Benzodiazepine   076 Tricyclics       808 Opiates          300 Cocaine          300 THC              50   Urine microscopic-add on     Status: Abnormal   Collection Time: 01/23/15 12:55 PM  Result Value Ref Range   Squamous Epithelial / LPF MANY (A) RARE   WBC, UA 3-6 <3 WBC/hpf   Bacteria, UA FEW (A) RARE   Urine-Other LESS THAN 10 mL OF URINE SUBMITTED     Comment: MICROSCOPIC EXAM PERFORMED ON UNCONCENTRATED URINE   Dg Chest 2 View  01/23/2015   CLINICAL DATA:  Slurred speech and generalized weakness  EXAM: CHEST  2 VIEW  COMPARISON:  None.  FINDINGS: Lungs are clear. Heart is mildly enlarged with pulmonary vascularity within normal limits. No adenopathy. No bone lesions.  IMPRESSION: Mild cardiomegaly.  No edema or consolidation.   Electronically Signed   By: Lowella Grip III M.D.   On: 01/23/2015 13:14   Ct Head Wo Contrast  01/23/2015   CLINICAL DATA:  Generalized weakness, dysphagia  EXAM: CT HEAD WITHOUT CONTRAST  TECHNIQUE: Contiguous axial images were obtained from the base of the skull through the vertex without intravenous contrast.  COMPARISON:  None.  FINDINGS: No skull fracture  is noted. Paranasal sinuses and mastoid air cells are unremarkable. No intracranial hemorrhage, mass effect or midline shift.  No acute infarction. No mass lesion is noted on this unenhanced scan. No hydrocephalus. The gray and white-matter differentiation is preserved.  IMPRESSION: No acute intracranial abnormality.   Electronically Signed   By: Lahoma Crocker M.D.   On: 01/23/2015 12:28   Mr Brain Wo Contrast  01/23/2015   CLINICAL DATA:  44 year old female with weakness, slurred speech, difficulty swallowing. Initial encounter.  EXAM: MRI HEAD WITHOUT CONTRAST  TECHNIQUE: Multiplanar, multiecho pulse sequences of the brain and surrounding structures were obtained without intravenous contrast.  COMPARISON:  Head CT without contrast 1206 hours today.  FINDINGS: Restricted diffusion in a patchy  and linear distribution in the left paracentral pons (series 4, image 15 and series 8, image 16). Nearby small right paracentral chronic pontine lacunar infarct (series 5, image 10) with hemosiderin. No hemorrhage associated with the acute lacune. T2 and FLAIR hyperintensity with no significant mass effect.  No other restricted diffusion. Major intracranial vascular flow voids are within normal limits.  Left corona radiata and lentiform nuclei a chronic lacunar infarct(s). No cortical encephalomalacia identified. No other chronic blood products identified. No midline shift, mass effect, evidence of mass lesion, ventriculomegaly, extra-axial collection or acute intracranial hemorrhage. Cervicomedullary junction and pituitary are within normal limits.  Normal bone marrow signal. Negative visible cervical spine. Visible internal auditory structures appear normal. Visualized paranasal sinuses and mastoids are clear. Visualized orbit soft tissues are within normal limits. Visualized scalp soft tissues are within normal limits.  IMPRESSION: 1. Acute brainstem infarct. Left paracentral pons lacune with no mass effect or hemorrhage.  2. 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 Chronic microhemorrhage associated with the former.   Electronically Signed   By: Genevie Ann M.D.   On: 01/23/2015 17:07    Review of Systems  Constitutional: Negative.   HENT: Negative.   Eyes: Negative.   Respiratory: Negative.   Cardiovascular: Negative.   Gastrointestinal: Negative.   Genitourinary: Negative.   Musculoskeletal: Negative.   Skin: Negative.   Neurological: Negative.   Endo/Heme/Allergies: Negative.   Psychiatric/Behavioral: Negative.     Blood pressure 150/73, pulse 78, temperature 98.1 F (36.7 C), temperature source Oral, resp. rate 18, last menstrual period 12/23/2014, SpO2 99 %. Physical Exam  Constitutional: She is oriented to person, place, and time. She appears well-developed and well-nourished.  HENT:  Head: Normocephalic and atraumatic.  Mouth/Throat: No oropharyngeal exudate.  Eyes: Conjunctivae and EOM are normal. Pupils are equal, round, and reactive to light. No scleral icterus.  Neck: Normal range of motion. Neck supple. No JVD present. No tracheal deviation present. No thyromegaly present.  Cardiovascular: Normal rate and regular rhythm.  Exam reveals no gallop and no friction rub.   No murmur heard. Respiratory: Effort normal and breath sounds normal. No respiratory distress. She has no wheezes. She has no rales.  GI: Soft. Bowel sounds are normal. She exhibits no distension. There is no tenderness. There is no rebound and no guarding.  Musculoskeletal: Normal range of motion. She exhibits no edema or tenderness.  Lymphadenopathy:    She has no cervical adenopathy.  Neurological: She is alert and oriented to person, place, and time. She has normal reflexes. She displays normal reflexes. No cranial nerve deficit. She exhibits normal muscle tone. Coordination normal.  Skin: Skin is warm and dry. No rash noted. No erythema. No pallor.  Psychiatric: She has a normal  mood and affect. Her behavior is normal. Judgment and thought content normal.     Assessment/Plan CVA Aspirin, lipitor Carotid ultrasound Cardiac 2d echo Appreciate neurology input.  NPO til passes nursing bedside swallow, and then diabetic diet  Hyponatremia Check cmp in am  ESRD ED PA stated that she consulted nephrology to arrange dialysis, appreciate input  Dm2 fsbs ac and qhs, iss  DVT prophylaxis scd  Jani Gravel 01/23/2015, 6:54 PM

## 2015-01-23 NOTE — ED Provider Notes (Signed)
CSN: [id number]     Arrival date & time 01/23/15  7829 History   First MD Initiated Contact with Patient 01/23/15 1046     Chief Complaint  Patient presents with  . Weakness  . Dysphagia     (Consider location/radiation/quality/duration/timing/severity/associated sxs/prior Treatment) The history is provided by the patient. No language interpreter was used.    Natalie [name] Is a 44 year old female with a past medical history of hypertension, diabetes, and end-stage renal disease who presents to the emergency department with difficulty with speech, numbness, difficulty swallowing and nausea. The patient is Spanish-speaking and also having confusion. Spanish interpreter is utilized. According to the patient yesterday around 10 AM she began feeling very nauseous but had no vomiting. The patient states that about 12:00, she began feeling extremely weak all over, she said she had difficulty swallowing but denies any throat pain, difficulty speaking. She complains of numbness from the mouth down the front of her throat. Any unilateral weakness, changes in vision. Patient is on dialysis. She was last dialyzed on Thursday of this week and is due today, Saturday, 01/23/2015. She states that she does sometimes get weak after dialysis. She states that this is different. She denies headaches. She is unsure who her primary care physician is.  Past Medical History  Diagnosis Date  . Hypertension   . Diabetes mellitus without complication   . Renal disorder    Past Surgical History  Procedure Laterality Date  . Insertion of dialysis catheter     No family history on file. History  Substance Use Topics  . Smoking status: Never Smoker   . Smokeless tobacco: Not on file  . Alcohol Use: No   OB History    No data available     Review of Systems  Constitutional: Negative for fever and chills.  HENT: Positive for trouble swallowing.   Eyes: Negative for visual disturbance.  Respiratory:  Negative for shortness of breath.   Cardiovascular: Negative for chest pain and leg swelling.  Gastrointestinal: Positive for nausea and constipation. Negative for vomiting, abdominal pain and diarrhea.  Genitourinary: Negative for dysuria and hematuria.  Musculoskeletal: Negative for myalgias, joint swelling, arthralgias and gait problem.  Skin: Negative for rash.  Neurological: Positive for speech difficulty, weakness and numbness. Negative for seizures, syncope and facial asymmetry.  Psychiatric/Behavioral: Positive for confusion.  All other systems reviewed and are negative.     Allergies  Review of patient's allergies indicates no known allergies.  Home Medications   Prior to Admission medications   Not on File   BP 150/59 mmHg  Pulse 76  Temp(Src) 98.5 F (36.9 C) (Oral)  Resp 18  SpO2 97%  LMP 12/23/2014 Physical Exam  Constitutional: She appears well-developed and well-nourished. She appears lethargic. No distress.  HENT:  Head: Normocephalic and atraumatic.  Eyes: Conjunctivae and EOM are normal. Pupils are equal, round, and reactive to light.  Neck: Normal range of motion. Neck supple. No JVD present.  Cardiovascular: Normal rate, regular rhythm, normal heart sounds and intact distal pulses.  Exam reveals no gallop and no friction rub.   No murmur heard. Pulmonary/Chest: Effort normal. No respiratory distress.  Abdominal: Soft. Bowel sounds are normal. She exhibits no distension and no mass. There is tenderness (, mild diffuse tenderness). There is no rebound and no guarding.  Neurological: She has normal reflexes. She appears lethargic. Disoriented: patient with disorientation, patient answers the year is 72. For place. She answers "[place]" but then corrects herself to [place].  No cranial nerve deficit. Sensory deficit: reported numbness of the mouth and throat. GCS eye subscore is 4. GCS verbal subscore is 4. GCS motor subscore is 6.  Difficulty following all  comands Diffuse weakness. No UL deficit Coordination deferred.  Speech is very slow, noticeable dysarthria  Skin: She is not diaphoretic.  Nursing note and vitals reviewed.   ED Course  Procedures (including critical care time) Labs Review Labs Reviewed  ETHANOL  PROTIME-INR  URINE RAPID DRUG SCREEN (HOSP PERFORMED)  URINALYSIS, ROUTINE W REFLEX MICROSCOPIC  COMPREHENSIVE METABOLIC PANEL  CBC WITH DIFFERENTIAL/PLATELET  TROPONIN I  I-STAT CHEM 8, ED  I-STAT TROPOININ, ED  I-STAT TROPOININ, ED  CBG MONITORING, ED  CBG MONITORING, ED    Imaging Review No results found.   EKG Interpretation   Date/Time:  Saturday January 23 2015 10:00:19 EDT Ventricular Rate:  76 PR Interval:  166 QRS Duration: 93 QT Interval:  446 QTC Calculation: 501 R Axis:   71 Text Interpretation:  Sinus rhythm Minimal ST depression Borderline  prolonged QT interval No previous ECGs available Confirmed by ZACKOWSKI   MD, SCOTT ([id number]) on 01/23/2015 10:10:00 AM      MDM   Final diagnoses:  Weakness        2:16 PM BP 145/85 mmHg  Pulse 70  Temp(Src) 98.1 F (36.7 C) (Oral)  Resp 18  SpO2 97%  LMP 12/23/2014 I was [facility]. She has a longstanding relationship with this patient. She states that the patient has low literacy, mild cognitive deficits at baseline. Patient is here on [place] work [place] from [place]. The patient is not usually confused.    Patient confirmed baseline MR, Low reading level.  The patient's niece called to talk to us about her. She also spoke with the patient and states that her mental status is at baseline, however, her speech is abnormal .considering her Dysarthria, we will proceed with an MRI.   5:44 PM BP 109/73 mmHg  Pulse 69  Temp(Src) 98.1 F (36.7 C) (Oral)  Resp 20  SpO2 96%  LMP 12/23/2014 Patient MRI returned with Acute Brainstem infarct.  I have spoken with Dr. Roseanne RenoStewart who will see the patient. Dr. Arlean HoppingSchertz  will see the patient in the morning.  Patient admitted inpatient By Dr. Selena BattenKim.   Arthor Captainbigail Emigdio Wildeman, PA-C 01/25/15 1438

## 2015-01-23 NOTE — ED Notes (Signed)
Pt niece Mirma called this RN and states that pt has hx MR and learning disability and is unsure of her birthday and also her age. Niece reports that pt speech is however not at baseline and reports that pt reports trouble speaking and also reports pain in her throat. Per mentation, niece states that pt is at baseline. PA Abigail made aware. Niece asks to be called if any other updates or questions.   [phone]

## 2015-01-23 NOTE — ED Notes (Signed)
Niece called and informed of pt plan of care, given opportunity to talk with admitting physician.

## 2015-01-23 NOTE — ED Provider Notes (Signed)
Medical screening examination/treatment/procedure(s) were conducted as a shared visit with non-physician practitioner(s) and myself.  I personally evaluated the patient during the encounter.   EKG Interpretation   Date/Time:  Saturday January 23 2015 10:00:19 EDT Ventricular Rate:  76 PR Interval:  166 QRS Duration: 93 QT Interval:  446 QTC Calculation: 501 R Axis:   71 Text Interpretation:  Sinus rhythm Minimal ST depression Borderline  prolonged QT interval No previous ECGs available Confirmed by Deyana Wnuk   MD, Permelia Bamba [phone]) on 01/23/2015 10:10:00 AM       Results for orders placed or performed during the hospital encounter of 01/23/15  Ethanol  Result Value Ref Range   Alcohol, Ethyl (B) <5 0 - 9 mg/dL  Protime-INR  Result Value Ref Range   Prothrombin Time 13.6 11.6 - 15.2 seconds   INR 1.03 0.00 - 1.49  Urine Drug Screen  Result Value Ref Range   Opiates NONE DETECTED NONE DETECTED   Cocaine NONE DETECTED NONE DETECTED   Benzodiazepines NONE DETECTED NONE DETECTED   Amphetamines NONE DETECTED NONE DETECTED   Tetrahydrocannabinol NONE DETECTED NONE DETECTED   Barbiturates NONE DETECTED NONE DETECTED  Comprehensive metabolic panel  Result Value Ref Range   Sodium 134 (L) 135 - 145 mmol/L   Potassium 4.5 3.5 - 5.1 mmol/L   Chloride 94 (L) 96 - 112 mmol/L   CO2 22 19 - 32 mmol/L   Glucose, Bld 152 (H) 70 - 99 mg/dL   BUN 43 (H) 6 - 23 mg/dL   Creatinine, Ser 6.04 (H) 0.50 - 1.10 mg/dL   Calcium 9.8 8.4 - 54.0 mg/dL   Total Protein 8.2 6.0 - 8.3 g/dL   Albumin 3.8 3.5 - 5.2 g/dL   AST 25 0 - 37 U/L   ALT 23 0 - 35 U/L   Alkaline Phosphatase 115 39 - 117 U/L   Total Bilirubin 0.7 0.3 - 1.2 mg/dL   GFR calc non Af Amer 6 (L) >90 mL/min   GFR calc Af Amer 7 (L) >90 mL/min   Anion gap 18 (H) 5 - 15  CBC WITH DIFFERENTIAL  Result Value Ref Range   WBC 13.0 (H) 4.0 - 10.5 K/uL   RBC 3.94 3.87 - 5.11 MIL/uL   Hemoglobin 12.7 12.0 - 15.0 g/dL   HCT 98.1 19.1 - 47.8 %    MCV 95.4 78.0 - 100.0 fL   MCH 32.2 26.0 - 34.0 pg   MCHC 33.8 30.0 - 36.0 g/dL   RDW 29.5 (H) 62.1 - 30.8 %   Platelets 464 (H) 150 - 400 K/uL   Neutrophils Relative % 68 43 - 77 %   Neutro Abs 8.9 (H) 1.7 - 7.7 K/uL   Lymphocytes Relative 22 12 - 46 %   Lymphs Abs 2.9 0.7 - 4.0 K/uL   Monocytes Relative 6 3 - 12 %   Monocytes Absolute 0.7 0.1 - 1.0 K/uL   Eosinophils Relative 3 0 - 5 %   Eosinophils Absolute 0.4 0.0 - 0.7 K/uL   Basophils Relative 1 0 - 1 %   Basophils Absolute 0.1 0.0 - 0.1 K/uL  Troponin I  Result Value Ref Range   Troponin I <0.03 <0.031 ng/mL  Urinalysis, Routine w reflex microscopic  Result Value Ref Range   Color, Urine YELLOW YELLOW   APPearance CLOUDY (A) CLEAR   Specific Gravity, Urine 1.010 1.005 - 1.030   pH 7.0 5.0 - 8.0   Glucose, UA 100 (A) NEGATIVE mg/dL  Hgb urine dipstick TRACE (A) NEGATIVE   Bilirubin Urine NEGATIVE NEGATIVE   Ketones, ur NEGATIVE NEGATIVE mg/dL   Protein, ur 30 (A) NEGATIVE mg/dL   Urobilinogen, UA 0.2 0.0 - 1.0 mg/dL   Nitrite NEGATIVE NEGATIVE   Leukocytes, UA LARGE (A) NEGATIVE  Drug screen panel, emergency  Result Value Ref Range   Opiates NONE DETECTED NONE DETECTED   Cocaine NONE DETECTED NONE DETECTED   Benzodiazepines NONE DETECTED NONE DETECTED   Amphetamines NONE DETECTED NONE DETECTED   Tetrahydrocannabinol NONE DETECTED NONE DETECTED   Barbiturates NONE DETECTED NONE DETECTED  Urine microscopic-add on  Result Value Ref Range   Squamous Epithelial / LPF MANY (A) RARE   WBC, UA 3-6 <3 WBC/hpf   Bacteria, UA FEW (A) RARE   Urine-Other LESS THAN 10 mL OF URINE SUBMITTED   I-Stat Chem 8, ED  Result Value Ref Range   Sodium 133 (L) 135 - 145 mmol/L   Potassium 4.5 3.5 - 5.1 mmol/L   Chloride 99 96 - 112 mmol/L   BUN 41 (H) 6 - 23 mg/dL   Creatinine, Ser 6.967.30 (H) 0.50 - 1.10 mg/dL   Glucose, Bld 295159 (H) 70 - 99 mg/dL   Calcium, Ion 2.841.10 (L) 1.12 - 1.23 mmol/L   TCO2 20 0 - 100 mmol/L   Hemoglobin  13.6 12.0 - 15.0 g/dL   HCT 13.240.0 44.036.0 - 10.246.0 %  I-Stat Troponin, ED ([facility])  Result Value Ref Range   Troponin i, poc 0.01 0.00 - 0.08 ng/mL   Comment 3           Dg Chest 2 View  01/23/2015   CLINICAL DATA:  Slurred speech and generalized weakness  EXAM: CHEST  2 VIEW  COMPARISON:  None.  FINDINGS: Lungs are clear. Heart is mildly enlarged with pulmonary vascularity within normal limits. No adenopathy. No bone lesions.  IMPRESSION: Mild cardiomegaly.  No edema or consolidation.   Electronically Signed   By: Bretta BangWilliam  Woodruff III M.D.   On: 01/23/2015 13:14   Ct Head Wo Contrast  01/23/2015   CLINICAL DATA:  Generalized weakness, dysphagia  EXAM: CT HEAD WITHOUT CONTRAST  TECHNIQUE: Contiguous axial images were obtained from the base of the skull through the vertex without intravenous contrast.  COMPARISON:  None.  FINDINGS: No skull fracture is noted. Paranasal sinuses and mastoid air cells are unremarkable. No intracranial hemorrhage, mass effect or midline shift.  No acute infarction. No mass lesion is noted on this unenhanced scan. No hydrocephalus. The gray and white-matter differentiation is preserved.  IMPRESSION: No acute intracranial abnormality.   Electronically Signed   By: Natasha MeadLiviu  Pop M.D.   On: 01/23/2015 12:28   Medical screening examination/treatment/procedure(s) were conducted as a shared visit with non-physician practitioner(s) and myself.  I personally evaluated the patient during the encounter.   EKG Interpretation   Date/Time:  Saturday January 23 2015 10:00:19 EDT Ventricular Rate:  76 PR Interval:  166 QRS Duration: 93 QT Interval:  446 QTC Calculation: 501 R Axis:   71 Text Interpretation:  Sinus rhythm Minimal ST depression Borderline  prolonged QT interval No previous ECGs available Confirmed by Sherrika Weakland   MD, Marsha Hillman ([id number]) on 01/23/2015 10:10:00 AM        Patient with difficulty with good indicating. Patient has somewhat of a language barrier and we  suspect that she has some MR. Patient is a dialysis patient known to the nephrology well normally dialyzed Tuesdays Thursdays and Saturdays today would  be that. Brought in by EMS for generalized weakness tongue numbness slurred speech and unable to swallow very well. Our workup including head CT were negative. We are suspicious this may be baseline mental status for her or trying to get that clarified if we can't we will do MRI brain. Patient's initial workup without significant abnormal findings. Patient's potassium is 4.5. Chest x-ray shows no significant edema.  Vanetta Mulders, MD 01/23/15 [phone]

## 2015-01-23 NOTE — ED Notes (Signed)
cbg 180 

## 2015-01-23 NOTE — ED Notes (Addendum)
Admitting physician paged for pt abnormal CBG.

## 2015-01-23 NOTE — Consult Note (Signed)
Neurology Consultation Reason for Consult: Stroke Referring Physician: Pixie CasinoKim, J  CC: Stroke  History is obtained from: Patient, via interpreter  HPI: Natalie [name] is a 44 y.o. female with a history of dysarthria and weakness since yesterday. She states that she has noticed more problems with her right side than she had previously and has had some difficulty walking. She has a history of mild MR at baseline.  She was brought in today given the persistent symptoms. She denies diplopia.  LKW: 10 AM 3/25 tpa given?: no, outside of window    ROS:  Unable to obtain due to language barrier  Past Medical History  Diagnosis Date  . Hypertension   . Diabetes mellitus without complication   . Renal disorder     Family History: Unable to obtain due to language barrier  Social History: Tob: Unable to obtain due to language.  Exam: Current vital signs: BP 152/79 mmHg  Pulse 69  Temp(Src) 98.1 F (36.7 C) (Oral)  Resp 17  SpO2 98%  LMP 12/23/2014 Vital signs in last 24 hours: Temp:  [98.1 F (36.7 C)-98.5 F (36.9 C)] 98.1 F (36.7 C) (03/26 1256) Pulse Rate:  [61-80] 69 (03/26 2000) Resp:  [13-22] 17 (03/26 2000) BP: (109-155)/(58-92) 152/79 mmHg (03/26 2000) SpO2:  [96 %-100 %] 98 % (03/26 2000)   Physical Exam  Constitutional: Appears well-developed and well-nourished.  Psych: Affect appropriate to situation Eyes: No scleral injection HENT: No OP obstrucion Head: Normocephalic.  Cardiovascular: Normal rate and regular rhythm.  Respiratory: Effort normal and breath sounds normal to anterior ascultation GI: Soft.  No distension. There is no tenderness.  Skin: WDI  Neuro: Mental Status: Patient is awake, alert, oriented to person, place, gives month as April, unable to give year, gives age as [age] and situation. No signs of aphasia or neglect. She is dysarthric Cranial Nerves: II: Visual Fields are full. Pupils are equal, round, and reactive to light.    III,IV, VI: EOMI without ptosis or diploplia.  V: Facial sensation is symmetric to temperature VII: Facial movement is notable for mild right facial droop VIII: hearing is intact to voice X: Uvula elevates symmetrically XI: Shoulder shrug is symmetric. XII: tongue is midline without atrophy or fasciculations.  Motor: Tone is normal. Bulk is normal. 4+/5 strength in the right arm and leg.  Sensory: Sensation is symmetric to light touch and temperature in the arms and legs. Cerebellar: She is markedly ataxic in the right arm, less so in the right leg, intact on left.     I have reviewed labs in epic and the results pertinent to this consultation are: Elevated cr, glucose.   I have reviewed the images obtained: MRI brain- pontine infarct.   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   Recommendations: 1. HgbA1c, fasting lipid panel 2. MRI, MRA  of the brain without contrast 3. Frequent neuro checks 4. Echocardiogram 5. Carotid dopplers  6. Prophylactic therapy-Antiplatelet med: Aspirin - dose 325mg  PO or 300mg  PR 7. Risk factor modification 8. Telemetry monitoring 9. PT consult, OT consult, Speech consult    Ritta SlotMcNeill Amedeo Detweiler, MD [facility] [phone]  If 7pm- 7am, please page neurology on call as listed in AMION.

## 2015-01-23 NOTE — ED Notes (Signed)
[facility] with [facility]/o generalized weakness,  tongue numbness, slurred speech and unable to swallow very well. Pt denies any pain at this time. Symptoms started yesterday morning at 10am. EMS had equal grips that were weak on both side. Pt stated that she is unable to swallow saliva. Interpretor is being used to communicate with patient, she speaks [place].

## 2015-01-23 NOTE — ED Notes (Signed)
Patient transported to CT 

## 2015-01-23 NOTE — ED Notes (Signed)
Neurology at bedside.

## 2015-01-24 ENCOUNTER — Encounter (HOSPITAL_COMMUNITY): Payer: Self-pay | Admitting: Internal Medicine

## 2015-01-24 DIAGNOSIS — F09 Unspecified mental disorder due to known physiological condition: Secondary | ICD-10-CM

## 2015-01-24 DIAGNOSIS — N390 Urinary tract infection, site not specified: Secondary | ICD-10-CM | POA: Diagnosis present

## 2015-01-24 DIAGNOSIS — E785 Hyperlipidemia, unspecified: Secondary | ICD-10-CM | POA: Diagnosis present

## 2015-01-24 DIAGNOSIS — E875 Hyperkalemia: Secondary | ICD-10-CM | POA: Diagnosis not present

## 2015-01-24 DIAGNOSIS — D72829 Elevated white blood cell count, unspecified: Secondary | ICD-10-CM

## 2015-01-24 DIAGNOSIS — I639 Cerebral infarction, unspecified: Secondary | ICD-10-CM

## 2015-01-24 DIAGNOSIS — N186 End stage renal disease: Secondary | ICD-10-CM

## 2015-01-24 DIAGNOSIS — D649 Anemia, unspecified: Secondary | ICD-10-CM | POA: Diagnosis present

## 2015-01-24 DIAGNOSIS — E039 Hypothyroidism, unspecified: Secondary | ICD-10-CM

## 2015-01-24 HISTORY — DX: Hyperlipidemia, unspecified: E78.5

## 2015-01-24 LAB — COMPREHENSIVE METABOLIC PANEL
ALT: 22 U/L (ref 0–35)
AST: 23 U/L (ref 0–37)
Albumin: 3.2 g/dL — ABNORMAL LOW (ref 3.5–5.2)
Alkaline Phosphatase: 94 U/L (ref 39–117)
Anion gap: 12 (ref 5–15)
BUN: 52 mg/dL — AB (ref 6–23)
CO2: 23 mmol/L (ref 19–32)
Calcium: 9.1 mg/dL (ref 8.4–10.5)
Chloride: 100 mmol/L (ref 96–112)
Creatinine, Ser: 8.3 mg/dL — ABNORMAL HIGH (ref 0.50–1.10)
GFR calc non Af Amer: 5 mL/min — ABNORMAL LOW (ref >90)
GFR, EST AFRICAN AMERICAN: 6 mL/min — AB (ref >90)
Glucose, Bld: 128 mg/dL — ABNORMAL HIGH (ref 70–99)
POTASSIUM: 5.2 mmol/L — AB (ref 3.5–5.1)
Sodium: 135 mmol/L (ref 135–145)
Total Bilirubin: 0.6 mg/dL (ref 0.3–1.2)
Total Protein: 7.5 g/dL (ref 6.0–8.3)

## 2015-01-24 LAB — LIPID PANEL
CHOL/HDL RATIO: 8.7 ratio
CHOLESTEROL: 305 mg/dL — AB (ref 0–200)
HDL: 35 mg/dL — ABNORMAL LOW (ref >39)
LDL Cholesterol: UNDETERMINED mg/dL (ref 0–99)
Triglycerides: 526 mg/dL — ABNORMAL HIGH (ref <150)
VLDL: UNDETERMINED mg/dL (ref 0–40)

## 2015-01-24 LAB — GLUCOSE, CAPILLARY
GLUCOSE-CAPILLARY: 120 mg/dL — AB (ref 70–99)
GLUCOSE-CAPILLARY: 198 mg/dL — AB (ref 70–99)
Glucose-Capillary: 127 mg/dL — ABNORMAL HIGH (ref 70–99)
Glucose-Capillary: 144 mg/dL — ABNORMAL HIGH (ref 70–99)
Glucose-Capillary: 254 mg/dL — ABNORMAL HIGH (ref 70–99)

## 2015-01-24 LAB — VITAMIN B12: Vitamin B-12: 963 pg/mL — ABNORMAL HIGH (ref 211–911)

## 2015-01-24 LAB — CBC
HCT: 34.8 % — ABNORMAL LOW (ref 36.0–46.0)
HEMOGLOBIN: 11.4 g/dL — AB (ref 12.0–15.0)
MCH: 31.5 pg (ref 26.0–34.0)
MCHC: 32.8 g/dL (ref 30.0–36.0)
MCV: 96.1 fL (ref 78.0–100.0)
Platelets: 447 10*3/uL — ABNORMAL HIGH (ref 150–400)
RBC: 3.62 MIL/uL — AB (ref 3.87–5.11)
RDW: 16.2 % — ABNORMAL HIGH (ref 11.5–15.5)
WBC: 11.1 10*3/uL — ABNORMAL HIGH (ref 4.0–10.5)

## 2015-01-24 LAB — TSH: TSH: 18.051 u[IU]/mL — ABNORMAL HIGH (ref 0.350–4.500)

## 2015-01-24 LAB — SEDIMENTATION RATE: Sed Rate: 101 mm/hr — ABNORMAL HIGH (ref 0–22)

## 2015-01-24 MED ORDER — LEVOTHYROXINE SODIUM 25 MCG PO TABS
25.0000 ug | ORAL_TABLET | Freq: Every day | ORAL | Status: DC
  Administered 2015-01-26 – 2015-02-01 (×6): 25 ug via ORAL
  Filled 2015-01-24 (×6): qty 1

## 2015-01-24 MED ORDER — FENOFIBRATE 160 MG PO TABS
160.0000 mg | ORAL_TABLET | Freq: Every day | ORAL | Status: DC
  Administered 2015-01-24 – 2015-02-01 (×9): 160 mg via ORAL
  Filled 2015-01-24 (×9): qty 1

## 2015-01-24 MED ORDER — CALCITRIOL 0.25 MCG PO CAPS
0.2500 ug | ORAL_CAPSULE | ORAL | Status: DC
  Administered 2015-01-25 – 2015-02-01 (×4): 0.25 ug via ORAL
  Filled 2015-01-24 (×5): qty 1

## 2015-01-24 MED ORDER — CEFTRIAXONE SODIUM IN DEXTROSE 20 MG/ML IV SOLN
1.0000 g | INTRAVENOUS | Status: DC
  Administered 2015-01-24 – 2015-01-26 (×3): 1 g via INTRAVENOUS
  Filled 2015-01-24 (×3): qty 50

## 2015-01-24 MED ORDER — LEVOTHYROXINE SODIUM 100 MCG IV SOLR
12.5000 ug | Freq: Every day | INTRAVENOUS | Status: DC
  Administered 2015-01-24: 12.5 ug via INTRAVENOUS
  Filled 2015-01-24 (×2): qty 5

## 2015-01-24 MED ORDER — ACETAMINOPHEN 325 MG PO TABS
650.0000 mg | ORAL_TABLET | Freq: Four times a day (QID) | ORAL | Status: AC | PRN
  Administered 2015-01-24 – 2015-01-25 (×2): 650 mg via ORAL
  Filled 2015-01-24: qty 2

## 2015-01-24 NOTE — Progress Notes (Signed)
Pt arrived to unit via stretcher from the ED.  Pt in no signs of distress. Phoned interpreter to complete intial assessment.  Pt oriented to room, equipment and plan of care.  Vitals and assessment stable.  Tele applied and Central Monitoring notified.  Bed low and call bell within reach.  Will continue to monitor.

## 2015-01-24 NOTE — Progress Notes (Signed)
VASCULAR LAB PRELIMINARY  PRELIMINARY  PRELIMINARY  PRELIMINARY  Carotid duplex  completed.    Preliminary report:  Bilateral:  1-39% ICA stenosis.  Vertebral artery flow is antegrade.      Doneshia Hill, RVT 01/24/2015, 2:52 PM

## 2015-01-24 NOTE — Progress Notes (Signed)
Physical Therapy Evaluation Patient Details Name: Natalie [name] MRN: [id number] DOB: [birth year] Today's Date: 01/24/2015   History of Present Illness  44 yo female, with history of HTN, DM, and ESRD admitted for CVA, difficulty speaking, swallowing, numbness, and nausea on admit.  Clinical Impression  Patient reports still feeling 'numb' in her mouth, otherwise feels ok.  Patient presents with balance impairment Mild/Mod severity, she states is 'normal' for her, but is elevated fall risk at this time.  Minimal physical assistance for mobility overall, unsteady in standing.  May have mild cognitive decline in understanding, but difficult to be certain due to language barrier.  Patient is appropriate to continue with skilled PT services for balance and neuro re-education at this time.    Follow Up Recommendations Supervision/Assistance - 24 hour;Home health PT    Equipment Recommendations   (Continue to assess, possibly cane or junior size RW.)    Recommendations for Other Services       Precautions / Restrictions Precautions Precautions: Fall Restrictions Weight Bearing Restrictions: No      Mobility  Bed Mobility Overal bed mobility: Needs Assistance Bed Mobility: Supine to Sit     Supine to sit: Min assist     General bed mobility comments: very short,  assist with bed controls and sitting up to edge of bed  Transfers Overall transfer level: Needs assistance Equipment used: Standard walker;None Transfers: Sit to/from [employer] CorporationStand;Stand Pivot Transfers Sit to Stand: Min guard Stand pivot transfers: Min guard       General transfer comment: Mild unsteady balance in standing  Ambulation/Gait Ambulation/Gait assistance: Min guard;Supervision Ambulation Distance (Feet): 150 Feet Assistive device: None;Rolling walker (2 wheeled) Gait Pattern/deviations: Drifts right/left;Staggering left;Staggering right   Gait velocity interpretation: Below normal speed for  age/gender General Gait Details: steady with device, but difficult to steer.  Mild/Mod unsteady without device, using rail or furniture for support.  Stairs            Wheelchair Mobility    Modified Rankin (Stroke Patients Only) Modified Rankin (Stroke Patients Only) Pre-Morbid Rankin Score: No symptoms Modified Rankin: Moderately severe disability     Balance Overall balance assessment: Needs assistance   Sitting balance-Leahy Scale: Fair       Standing balance-Leahy Scale: Fair                               Pertinent Vitals/Pain Pain Assessment: No/denies pain    Home Living Family/patient expects to be discharged to:: Private residence Living Arrangements: Other relatives Available Help at Discharge: Family (Sister) Type of Home: House       Home Layout: One level Home Equipment: None      Prior Function Level of Independence: Independent         Comments: reports getting a ride to dialysis.     Hand Dominance        Extremity/Trunk Assessment   Upper Extremity Assessment: Defer to OT evaluation;Overall WFL for tasks assessed           Lower Extremity Assessment: Overall WFL for tasks assessed         Communication   Communication: Prefers language other than English (Spanish)  Cognition Arousal/Alertness: Awake/alert   Overall Cognitive Status: Difficult to assess                      General Comments General comments (skin integrity, edema, etc.): R UE access for dialysis,  patient very short    Exercises        Assessment/Plan    PT Assessment Patient needs continued PT services  PT Diagnosis Abnormality of gait   PT Problem List Decreased balance;Decreased mobility;Decreased safety awareness  PT Treatment Interventions Gait training;Functional mobility training;Balance training;Neuromuscular re-education   PT Goals (Current goals can be found in the Care Plan section) Acute Rehab PT Goals Patient  Stated Goal: None stated PT Goal Formulation: With patient Time For Goal Achievement: 02/07/15 Potential to Achieve Goals: Good    Frequency Min 4X/week   Barriers to discharge Decreased caregiver support      Co-evaluation               End of Session Equipment Utilized During Treatment: Gait belt Activity Tolerance: Patient tolerated treatment well Patient left: in chair;with call bell/phone within reach Nurse Communication: Mobility status;Precautions         Time: 0930-1030 PT Time Calculation (min) (ACUTE ONLY): 60 min   Charges:   PT Evaluation $Initial PT Evaluation Tier I: 1 Procedure PT Treatments $Gait Training: 8-22 mins $Therapeutic Activity: 8-22 mins   PT G Codes:        Braelyn Bordonaro L February 18, 2015, 10:45 AM

## 2015-01-24 NOTE — Progress Notes (Signed)
[facility] PROGRESS NOTE  Kaydie Petsch ZOX:[id number] DOB: Dec 20, 1970 DOA: 01/23/2015 PCP: No primary care provider on file.  Assessment/Plan: #1 acute brainstem infarct. Left paracentral troponins lacunar with no mass effect or hemorrhage Prior MRI head. Patient also noted to have chronic lacunar infarcts of the right pons and left basal ganglia/corona radiata. Patient with dysmetria and right-sided weakness with some dysphagia. Carotid Dopplers with no significant ICA stenosis from preliminary reading. 2-D echo is pending. Unable to contact late LDL secondary to triglycerides of 526. Hemoglobin A1c is pending. Will place on a statin and TriCor. Continue aspirin for secondary stroke prevention. PT/OT/ST. Neurology following and appreciate input and recommendations.  #2 hypertension Stable. Continue Norvasc and lisinopril.  #3 hypothyroidism TSH elevated at 18.051. Will start patient on Synthroid 25 g daily. Will need outpatient follow-up.  #4 probably urinary tract infection Check urine cultures. Change oral ciprofloxacin to IV Rocephin. Follow.  #5 diabetes mellitus Hemoglobin A1c pending. Continue sliding scale insulin.  #6 end-stage renal disease on hemodialysis Tuesdays Thursdays Saturdays Per nephrology.  #7 hyperkalemia Likely secondary to problem #6. Per nephrology.  #8 leukocytosis Likely secondary to UTI. Urine cultures pending. Chest x-ray negative for any acute infiltrates. Change oral ciprofloxacin to IV Rocephin. Follow.  #9 prophylaxis SCDs for DVT prophylaxis.  Code Status: Full Family Communication: Updated patient and family at bedside. Disposition Plan: Home with home health when medically stable.   Consultants:  Nephrology: Dr. Arlean Hopping 01/24/2015  Neurology: Dr. Amada Jupiter 01/23/2015  Procedures:  CT head 01/23/2015  Chest x-ray 01/23/2015  MRI head 01/23/2015  Antibiotics:  Oral Cipro 01/23/15 >>>01/24/15  IV Rocephin  01/24/15  HPI/Subjective: Patient with right-sided weakness. Patient with some numbness of the face. Patient denies any dizziness.  Objective: Filed Vitals:   01/24/15 1706  BP: 149/71  Pulse: 81  Temp: 98.7 F (37.1 C)  Resp: 18   No intake or output data in the 24 hours ending 01/24/15 1753 Filed Weights   01/23/15 2102  Weight: 60.555 kg (133 lb 8 oz)    Exam:   General:  NAD  Cardiovascular: RRR  Respiratory: CTAB anterior lung fields  Abdomen: Soft, nontender, nondistended, positive bowel sounds.  Musculoskeletal: No clubbing cyanosis or edema.  Data Reviewed: Basic Metabolic Panel:  Recent Labs Lab 01/23/15 1150 01/23/15 1159 01/24/15 0445  NA 134* 133* 135  K 4.5 4.5 5.2*  CL 94* 99 100  CO2 22  --  23  GLUCOSE 152* 159* 128*  BUN 43* 41* 52*  CREATININE 7.21* 7.30* 8.30*  CALCIUM 9.8  --  9.1   Liver Function Tests:  Recent Labs Lab 01/23/15 1150 01/24/15 0445  AST 25 23  ALT 23 22  ALKPHOS 115 94  BILITOT 0.7 0.6  PROT 8.2 7.5  ALBUMIN 3.8 3.2*   No results for input(s): LIPASE, AMYLASE in the last 168 hours. No results for input(s): AMMONIA in the last 168 hours. CBC:  Recent Labs Lab 01/23/15 1150 01/23/15 1159 01/24/15 0445  WBC 13.0*  --  11.1*  NEUTROABS 8.9*  --   --   HGB 12.7 13.6 11.4*  HCT 37.6 40.0 34.8*  MCV 95.4  --  96.1  PLT 464*  --  447*   Cardiac Enzymes:  Recent Labs Lab 01/23/15 1150  TROPONINI <0.03   BNP (last 3 results) No results for input(s): BNP in the last 8760 hours.  ProBNP (last 3 results) No results for input(s): PROBNP in the last 8760 hours.  CBG:  Recent Labs Lab 01/23/15 2018 01/24/15 0009 01/24/15 0453 01/24/15 0835 01/24/15 1217  GLUCAP 180* 120* 127* 144* 198*    No results found for this or any previous visit (from the past 240 hour(s)).   Studies: Dg Chest 2 View  01/23/2015   CLINICAL DATA:  Slurred speech and generalized weakness  EXAM: CHEST  2 VIEW   COMPARISON:  None.  FINDINGS: Lungs are clear. Heart is mildly enlarged with pulmonary vascularity within normal limits. No adenopathy. No bone lesions.  IMPRESSION: Mild cardiomegaly.  No edema or consolidation.   Electronically Signed   By: Bretta BangWilliam  Woodruff III M.D.   On: 01/23/2015 13:14   Ct Head Wo Contrast  01/23/2015   CLINICAL DATA:  Generalized weakness, dysphagia  EXAM: CT HEAD WITHOUT CONTRAST  TECHNIQUE: Contiguous axial images were obtained from the base of the skull through the vertex without intravenous contrast.  COMPARISON:  None.  FINDINGS: No skull fracture is noted. Paranasal sinuses and mastoid air cells are unremarkable. No intracranial hemorrhage, mass effect or midline shift.  No acute infarction. No mass lesion is noted on this unenhanced scan. No hydrocephalus. The gray and white-matter differentiation is preserved.  IMPRESSION: No acute intracranial abnormality.   Electronically Signed   By: Natasha MeadLiviu  Pop M.D.   On: 01/23/2015 12:28   Mr Brain Wo Contrast  01/23/2015   CLINICAL DATA:  44 year old female with weakness, slurred speech, difficulty swallowing. Initial encounter.  EXAM: MRI HEAD WITHOUT CONTRAST  TECHNIQUE: Multiplanar, multiecho pulse sequences of the brain and surrounding structures were obtained without intravenous contrast.  COMPARISON:  Head CT without contrast 1206 hours today.  FINDINGS: Restricted diffusion in a patchy and linear distribution in the left paracentral pons (series 4, image 15 and series 8, image 16). Nearby small right paracentral chronic pontine lacunar infarct (series 5, image 10) with hemosiderin. No hemorrhage associated with the acute lacune. T2 and FLAIR hyperintensity with no significant mass effect.  No other restricted diffusion. Major intracranial vascular flow voids are within normal limits.  Left corona radiata and lentiform nuclei a chronic lacunar infarct(s). No cortical encephalomalacia identified. No other chronic blood products  identified. No midline shift, mass effect, evidence of mass lesion, ventriculomegaly, extra-axial collection or acute intracranial hemorrhage. Cervicomedullary junction and pituitary are within normal limits.  Normal bone marrow signal. Negative visible cervical spine. Visible internal auditory structures appear normal. Visualized paranasal sinuses and mastoids are clear. Visualized orbit soft tissues are within normal limits. Visualized scalp soft tissues are within normal limits.  IMPRESSION: 1. Acute brainstem infarct. Left paracentral pons lacune with no mass effect or hemorrhage. 2. 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 Chronic microhemorrhage associated with the former.   Electronically Signed   By: Odessa FlemingH  Hall M.D.   On: 01/23/2015 17:07    Scheduled Meds: . amLODipine  10 mg Oral Daily  . aspirin  300 mg Rectal Daily   Or  . aspirin  325 mg Oral Daily  . atorvastatin  80 mg Oral q1800  . [START ON 01/25/2015] calcitRIOL  0.25 mcg Oral Q M,W,F-HD  . cefTRIAXone (ROCEPHIN)  IV  1 g Intravenous Q24H  . fenofibrate  160 mg Oral Daily  . insulin aspart  0-5 Units Subcutaneous QHS  . insulin aspart  0-9 Units Subcutaneous TID WC  . levothyroxine  12.5 mcg Intravenous QAC breakfast  . lisinopril  40 mg Oral QHS  . sevelamer carbonate  800 mg Oral BID WC  .  sodium chloride  3 mL Intravenous Q12H   Continuous Infusions:   Principal Problem:   Brainstem stroke Active Problems:   ESRD (end stage renal disease)   Diabetes   CVA (cerebral infarction)   Life long cognitive dysfunction   Hypothyroidism   Hyperlipidemia   Anemia   Hyperkalemia   Leukocytosis   UTI (urinary tract infection)    Time spent: 40 minutes    [facility] MD [facility] Pager [phone]. If 7PM-7AM, please contact night-coverage at [email or web address], password [facility] 01/24/2015, 5:53 PM  LOS: 1 day

## 2015-01-24 NOTE — Evaluation (Signed)
Clinical/Bedside Swallow Evaluation Patient Details  Name: Natalie [name] MRN: [id number] Date of Birth: 1971/06/01  Today's Date: 01/24/2015 Time: SLP Start Time (ACUTE ONLY): 1020 SLP Stop Time (ACUTE ONLY): 1035 SLP Time Calculation (min) (ACUTE ONLY): 15 min  Past Medical History:  Past Medical History  Diagnosis Date  . Hypertension   . Diabetes mellitus without complication   . Hyperlipidemia 01/24/2015  . ESRD on hemodialysis started 02/25/2013    [facility] [place] -   . Anemia of chronic disease   . Secondary hyperparathyroidism    Past Surgical History:  Past Surgical History  Procedure Laterality Date  . Insertion of dialysis catheter     HPI:  Pt is a 44 y.o. female admitted with dysarthria, trouble swallowing, and right sided weakness. 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  Medical history significant for HTN, diabetes, end stage renal disease, and intellectual disability.   Pt with a history of low literacy, no education and is non [place] speaking.   Assessment / Plan / Recommendation Clinical Impression  Pt presents with neurogenic oral pharyngeal dysphagia. Pt with subtle R sided deficits with likely CN VII involvement. PO trials of thin liquid by cup and straw administered with patient exhibiting delayed coughing. No overt signs or symptoms of aspiration with nectar thick liquids. Recommend nectar thick liquids and dysphagia 3 diet. Whole pills with puree. Continued dysphagia intervention indicated for diet tolerance and potential objective swallow study.     Aspiration Risk  Moderate    Diet Recommendation Dysphagia 3 (Mechanical Soft);Nectar-thick liquid   Liquid Administration via: Straw;Cup Medication Administration: Whole meds with puree Supervision: Full supervision/cueing for compensatory strategies Compensations: Slow rate;Small sips/bites;Follow solids with  liquid Postural Changes and/or Swallow Maneuvers: Upright 30-60 min after meal;Seated upright 90 degrees    Other  Recommendations Oral Care Recommendations: Oral care BID   Follow Up Recommendations       Frequency and Duration min 2x/week  2 weeks   Pertinent Vitals/Pain NA    SLP Swallow Goals     Swallow Study Prior Functional Status  Type of Home: House Available Help at Discharge: Family (Sister)    General Date of Onset: 01/23/15 HPI: Pt is a 44 y.o. female admitted with dysarthria, trouble swallowing, and right sided weakness. 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  Medical history significant for HTN, diabetes, end stage renal disease, and intellectual disability.   Pt with a history of low literacy, no education and is non [place] speaking. Type of Study: Bedside swallow evaluation Diet Prior to this Study: NPO Temperature Spikes Noted: No Respiratory Status: Room air History of Recent Intubation: No Behavior/Cognition: Alert Oral Cavity - Dentition: Missing dentition (predominantly edentulous lower oral cavity ) Self-Feeding Abilities: Needs set up Patient Positioning: Upright in chair Baseline Vocal Quality: Clear Volitional Cough: Strong    Oral/Motor/Sensory Function Overall Oral Motor/Sensory Function: Impaired Labial ROM: Reduced right Labial Symmetry: Abnormal symmetry right Labial Strength: Reduced Labial Sensation: Reduced Lingual ROM: Reduced right Lingual Strength: Reduced Lingual Sensation: Reduced Facial Symmetry: Right droop Velum: Impaired right   Ice Chips Ice chips: Not tested   Thin Liquid Thin Liquid: Within functional limits Presentation: Cup;Straw Pharyngeal  Phase Impairments: Cough - Delayed    Nectar Thick Nectar Thick Liquid: Within functional limits   Honey Thick Honey Thick Liquid: Not tested  Puree Puree: Within functional  limits Presentation: Self Fed   Solid   GO   Marcene Duoshelsea Sumney MA, CCC-SLP Acute Care Speech Language Pathologist    Solid: Within functional limits       Marcene DuosSumney, Ella Guillotte E 01/24/2015,11:38 AM

## 2015-01-24 NOTE — Consult Note (Signed)
[facility] Renal Consultation Note    Indication for Consultation:  Management of ESRD/hemodialysis; anemia, hypertension/volume and secondary hyperparathyroidism PCP: Unknown  HPI: Natalie [name] is a 44 y.o. female with ESRD, specific etiology unknown but had accelerated hypertension/DM at the time of diagnosis in [place] where she was on a work visa from [place]  She started hemodialysis 02/25/2014 in [place] and for unclear reasons, moved to [place] and has been dialyzing fairly uneventfully since 03/05/2013.  She has a history of low literacy, no education and is non [place] speaking. She has no insurance for medicines.   A family member called the patient's dialysis center yesterday am and told the Charge RN that the patient was having trouble swallowing, her face was swelling and her lips were blue.   The Charge RN at the dialysis center advised the pt's family to call EMS to transport her to the hospital for evaluation, where the patient reported similar symptoms and was drooling some, not swallowing saliva and feeling weak. She denied pain, SOB, CP. She did report nausea, but no vomiting.and having difficulty speaking.  She is prone to constipation and her rounding MD suggested she try some OTC dulcolax or miralax. Initial CT was negative. CXR showed NAD, Drug screen negative. WBC ^ with normal diff (noted to have chronic mild leukocytosis with outpt labs). MRI showed acute brainstem infract with chronic lacunar infarcts. Her last hemodialysis treatment was uneventful on 3/24.Pre HD BP 1s 169/90 with post BP sitting and standing ~103/50 net UF 2 L with post weight 60.9 (EDW 60.5 and pt often drinks liquids during her dialysis treatments.). Denies any pain at present.  Past Medical History  Diagnosis Date  . Hypertension   . Diabetes mellitus without complication   . Renal disorder   . Hyperlipidemia 01/24/2015   Past Surgical History  Procedure Laterality Date  .  Insertion of dialysis catheter     History reviewed. No pertinent family history. Social History:   reports that she has never smoked. She does not have any smokeless tobacco history on file. She reports that she does not drink alcohol or use illicit drugs. No Known Allergies Prior to Admission medications   Medication Sig Start Date End Date Taking? Authorizing Provider  amLODipine (NORVASC) 10 MG tablet Take 10 mg by mouth daily.   Yes Historical Provider, MD  glipiZIDE (GLUCOTROL) 5 MG tablet Take 5 mg by mouth daily before breakfast.   Yes Historical Provider, MD  lisinopril (PRINIVIL,ZESTRIL) 40 MG tablet Take 40 mg by mouth at bedtime.   Yes Historical Provider, MD  sevelamer carbonate (RENVELA) 800 MG tablet Take 800 mg by mouth 2 (two) times daily with a meal.   Yes Historical Provider, MD   Current Facility-Administered Medications  Medication Dose Route Frequency Provider Last Rate Last Dose  . 0.9 %  sodium chloride infusion  250 mL Intravenous PRN Pearson Grippe, MD      . amLODipine (NORVASC) tablet 10 mg  10 mg Oral Daily Pearson Grippe, MD   10 mg at 01/23/15 2314  . aspirin suppository 300 mg  300 mg Rectal Daily Pearson Grippe, MD   300 mg at 01/23/15 2259   Or  . aspirin tablet 325 mg  325 mg Oral Daily Pearson Grippe, MD      . atorvastatin (LIPITOR) tablet 80 mg  80 mg Oral q1800 Pearson Grippe, MD      . cefTRIAXone (ROCEPHIN) 1 g in dextrose 5 % 50 mL IVPB -  Premix  1 g Intravenous Q24H Rodolph Bong, MD   1 g at 01/24/15 0856  . fenofibrate tablet 160 mg  160 mg Oral Daily Ramiro Harvest V, MD      . insulin aspart (novoLOG) injection 0-5 Units  0-5 Units Subcutaneous QHS Pearson Grippe, MD   0 Units at 01/23/15 2315  . insulin aspart (novoLOG) injection 0-9 Units  0-9 Units Subcutaneous TID WC Pearson Grippe, MD   1 Units at 01/24/15 [phone]  . levothyroxine (SYNTHROID, LEVOTHROID) injection 12.5 mcg  12.5 mcg Intravenous QAC breakfast Rodolph Bong, MD   12.5 mcg at 01/24/15 0856  . lisinopril  (PRINIVIL,ZESTRIL) tablet 40 mg  40 mg Oral QHS Pearson Grippe, MD   40 mg at 01/23/15 2314  . sevelamer carbonate (RENVELA) tablet 800 mg  800 mg Oral BID WC Pearson Grippe, MD   800 mg at 01/24/15 0857  . sodium chloride 0.9 % injection 3 mL  3 mL Intravenous Q12H Pearson Grippe, MD   3 mL at 01/23/15 2315  . sodium chloride 0.9 % injection 3 mL  3 mL Intravenous PRN Pearson Grippe, MD       Labs: Basic Metabolic Panel:  Recent Labs Lab 01/23/15 1150 01/23/15 1159 01/24/15 0445  NA 134* 133* 135  K 4.5 4.5 5.2*  CL 94* 99 100  CO2 22  --  23  GLUCOSE 152* 159* 128*  BUN 43* 41* 52*  CREATININE 7.21* 7.30* 8.30*  CALCIUM 9.8  --  9.1   Liver Function Tests:  Recent Labs Lab 01/23/15 1150 01/24/15 0445  AST 25 23  ALT 23 22  ALKPHOS 115 94  BILITOT 0.7 0.6  PROT 8.2 7.5  ALBUMIN 3.8 3.2*  CBC:  Recent Labs Lab 01/23/15 1150 01/23/15 1159 01/24/15 0445  WBC 13.0*  --  11.1*  NEUTROABS 8.9*  --   --   HGB 12.7 13.6 11.4*  HCT 37.6 40.0 34.8*  MCV 95.4  --  96.1  PLT 464*  --  447*   Cardiac Enzymes:  Recent Labs Lab 01/23/15 1150  TROPONINI <0.03   CBG:  Recent Labs Lab 01/23/15 1925 01/23/15 2018 01/24/15 0009 01/24/15 0453 01/24/15 0835  GLUCAP 64* 180* 120* 127* 144*   Studies/Results: Dg Chest 2 View  01/23/2015   CLINICAL DATA:  Slurred speech and generalized weakness  EXAM: CHEST  2 VIEW  COMPARISON:  None.  FINDINGS: Lungs are clear. Heart is mildly enlarged with pulmonary vascularity within normal limits. No adenopathy. No bone lesions.  IMPRESSION: Mild cardiomegaly.  No edema or consolidation.   Electronically Signed   By: Bretta Bang III M.D.   On: 01/23/2015 13:14   Ct Head Wo Contrast  01/23/2015   CLINICAL DATA:  Generalized weakness, dysphagia  EXAM: CT HEAD WITHOUT CONTRAST  TECHNIQUE: Contiguous axial images were obtained from the base of the skull through the vertex without intravenous contrast.  COMPARISON:  None.  FINDINGS: No skull fracture  is noted. Paranasal sinuses and mastoid air cells are unremarkable. No intracranial hemorrhage, mass effect or midline shift.  No acute infarction. No mass lesion is noted on this unenhanced scan. No hydrocephalus. The gray and white-matter differentiation is preserved.  IMPRESSION: No acute intracranial abnormality.   Electronically Signed   By: Natasha Mead M.D.   On: 01/23/2015 12:28   Mr Brain Wo Contrast  01/23/2015   CLINICAL DATA:  44 year old female with weakness, slurred speech, difficulty swallowing. Initial encounter.  EXAM: MRI  HEAD WITHOUT CONTRAST  TECHNIQUE: Multiplanar, multiecho pulse sequences of the brain and surrounding structures were obtained without intravenous contrast.  COMPARISON:  Head CT without contrast 1206 hours today.  FINDINGS: Restricted diffusion in a patchy and linear distribution in the left paracentral pons (series 4, image 15 and series 8, image 16). Nearby small right paracentral chronic pontine lacunar infarct (series 5, image 10) with hemosiderin. No hemorrhage associated with the acute lacune. T2 and FLAIR hyperintensity with no significant mass effect.  No other restricted diffusion. Major intracranial vascular flow voids are within normal limits.  Left corona radiata and lentiform nuclei a chronic lacunar infarct(s). No cortical encephalomalacia identified. No other chronic blood products identified. No midline shift, mass effect, evidence of mass lesion, ventriculomegaly, extra-axial collection or acute intracranial hemorrhage. Cervicomedullary junction and pituitary are within normal limits.  Normal bone marrow signal. Negative visible cervical spine. Visible internal auditory structures appear normal. Visualized paranasal sinuses and mastoids are clear. Visualized orbit soft tissues are within normal limits. Visualized scalp soft tissues are within normal limits.  IMPRESSION: 1. Acute brainstem infarct. Left paracentral pons lacune with no mass effect or hemorrhage.  2. 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 Chronic microhemorrhage associated with the former.   Electronically Signed   By: Odessa FlemingH  Hall M.D.   On: 01/23/2015 17:07   ROS: As per HPI - limited by language barrier.  Physical Exam: Filed Vitals:   01/24/15 0300 01/24/15 0500 01/24/15 0700 01/24/15 0836  BP: 156/73 141/68 128/67 127/58  Pulse: 73 64 69 68  Temp: 98.3 F (36.8 C) 98.5 F (36.9 C) 98 F (36.7 C) 98.1 F (36.7 C)  TempSrc: Oral Oral Oral Oral  Resp: 16 16 16 18   Height:      Weight:      SpO2: 97% 95% 95% 100%     General: Small well developed, well nourished, in no acute distress.sitting in recliner Head: Normocephalic, atraumatic, sclera non-icteric, mucus membranes are moist Neck: Supple. JVD not elevated. Lungs: Clear bilaterally to auscultation without wheezes, rales, or rhonchi. Breathing is unlabored, shallow breaths Heart: RRR with S1 S2. No murmurs, rubs, or gallops appreciated. Abdomen: Soft, non-tender, non-distended with normoactive bowel sounds. No rebound/guarding. No obvious abdominal masses. Lower extremities:without edema or ischemic changes, no open wounds  Neuro: Alert- swallowing improved from yesterday - saw in ED Saturday afternoon - was holding saliva in mouth, not today Psych: affect appropriate - at baseline Dialysis Access: right upper AVF+ bruit  Dialysis Orders: [facility] 4 hr EDW 60.5 180 440/A 1.5 2K 2 Ca AVF no profile Mircera 50 q 4 weeks heparin  3500 calcitriol 0.25 - recently lowered from 0.75 due to ^ corr Ca iPTH 639 3/24 hgb 11.4 17%sat 3/24 - ferritin 1473 10/2014  Assessment/Plan: 1. Acute brainstem (pons) infarct with history of chronic lacunar infracts - work up per Neuro - so far Marland Kitchen^^ Chol,^^^ TG - started statin, PT worked with pt this am - said some balance issues but pt said that wasn't new - he felt she needed ongoing PT.  2. ESRD -  TTS - last HD Thursday - but essentially no intake and NPO  now - so ok to postpone HD until Monday - off schedule 3. Hypertension/volume  - CXR NAD on admission - BP 120 - 150 - in usual range; she continues on norvasc/lisinopril - her usual outpt BP meds 4. Anemia  - Hgb 11.4 - hold on dosing ESA for  now/ no Fe 5. Metabolic bone disease -  Continue calcitriol at current dose - if corrected Ca stays down, might ^ to 0.5- binders when eating. P well controlled 6. Nutrition - NPO - needs swallowing eval 7. DM - on glucotrol as an outpt - per primary - 8. Hypothyroidism - TSH -18 -synthroid started  Sheffield Slider, PA-C [facility] Beeper [phone] 01/24/2015, 9:50 AM   Pt seen, examined and agree w A/P as above. ESRD patient w lifelong cognitive dysfunction on HD here in [place] since 2014. This is first hosp stay, pt presented with difficulty swallowing , speech problems and weak all over.  Weak in both legs.  She came to ED. Ultimately MRI of brain showed acute CVA in the brainstem (pons).  Patient is feeling a little better today. Will plan for HD Monday off schedule, stable from our standpoint.  Vinson Moselle MD pager [phone]    cell [phone] 01/24/2015, 3:04 PM

## 2015-01-24 NOTE — Progress Notes (Signed)
STROKE TEAM PROGRESS NOTE   HISTORY Natalie [name] is a 44 y.o. female with a history of dysarthria and weakness since yesterday. She states that she has noticed more problems with her right side than she had previously and has had some difficulty walking. She has a history of mild MR at baseline.  She was brought in today given the persistent symptoms. She denies diplopia.  LKW: 10 AM 3/25 tpa given?: no, outside of window   SUBJECTIVE (INTERVAL HISTORY) No family is at bedside. Says she feels better, some numbness in the face. Denies dizziness. She still has reported right-sided weakness.    OBJECTIVE Temp:  [98 F (36.7 C)-98.5 F (36.9 C)] 98.1 F (36.7 C) (03/27 0836) Pulse Rate:  [61-80] 68 (03/27 0836) Cardiac Rhythm:  [-] Normal sinus rhythm;Sinus bradycardia (03/27 0900) Resp:  [13-20] 18 (03/27 0836) BP: (109-156)/(52-92) 127/58 mmHg (03/27 0836) SpO2:  [95 %-100 %] 100 % (03/27 0836) Weight:  [60.555 kg (133 lb 8 oz)] 60.555 kg (133 lb 8 oz) (03/26 2102)   Recent Labs Lab 01/23/15 1925 01/23/15 2018 01/24/15 0009 01/24/15 0453 01/24/15 0835  GLUCAP 64* 180* 120* 127* 144*    Recent Labs Lab 01/23/15 1150 01/23/15 1159 01/24/15 0445  NA 134* 133* 135  K 4.5 4.5 5.2*  CL 94* 99 100  CO2 22  --  23  GLUCOSE 152* 159* 128*  BUN 43* 41* 52*  CREATININE 7.21* 7.30* 8.30*  CALCIUM 9.8  --  9.1    Recent Labs Lab 01/23/15 1150 01/24/15 0445  AST 25 23  ALT 23 22  ALKPHOS 115 94  BILITOT 0.7 0.6  PROT 8.2 7.5  ALBUMIN 3.8 3.2*    Recent Labs Lab 01/23/15 1150 01/23/15 1159 01/24/15 0445  WBC 13.0*  --  11.1*  NEUTROABS 8.9*  --   --   HGB 12.7 13.6 11.4*  HCT 37.6 40.0 34.8*  MCV 95.4  --  96.1  PLT 464*  --  447*    Recent Labs Lab 01/23/15 1150  TROPONINI <0.03    Recent Labs  01/23/15 1150  LABPROT 13.6  INR 1.03    Recent Labs  01/23/15 1255  COLORURINE YELLOW  LABSPEC 1.010  PHURINE 7.0  GLUCOSEU 100*   HGBUR TRACE*  BILIRUBINUR NEGATIVE  KETONESUR NEGATIVE  PROTEINUR 30*  UROBILINOGEN 0.2  NITRITE NEGATIVE  LEUKOCYTESUR LARGE*       Component Value Date/Time   CHOL 305* 01/24/2015 0445   TRIG 526* 01/24/2015 0445   HDL 35* 01/24/2015 0445   CHOLHDL 8.7 01/24/2015 0445   VLDL UNABLE TO CALCULATE IF TRIGLYCERIDE OVER 400 mg/dL 40/98/119103/27/2016 [id number]   LDLCALC UNABLE TO CALCULATE IF TRIGLYCERIDE OVER 400 mg/dL 95/62/130803/27/2016 [id number]   No results found for: HGBA1C    Component Value Date/Time   LABOPIA NONE DETECTED 01/23/2015 1255   COCAINSCRNUR NONE DETECTED 01/23/2015 1255   LABBENZ NONE DETECTED 01/23/2015 1255   AMPHETMU NONE DETECTED 01/23/2015 1255   THCU NONE DETECTED 01/23/2015 1255   LABBARB NONE DETECTED 01/23/2015 1255     Recent Labs Lab 01/23/15 1150  ETH <5    Dg Chest 2 View 01/23/2015    Mild cardiomegaly.  No edema or consolidation.    Ct Head Wo Contrast 01/23/2015    No acute intracranial abnormality.     Mr Brain Wo Contrast 01/23/2015    1. Acute brainstem infarct. Left paracentral pons lacune with no mass effect or hemorrhage.  2. Chronic lacunar infarcts  of the right pons and left basal ganglia/corona radiata. Chronic microhemorrhage associated with the former.    PHYSICAL EXAM Physical exam: Exam: Gen: NAD Eyes: anicteric sclerae, moist conjunctivae                    CV: no MRG, no carotid bruits, no peripheral edema Mental Status: Alert, oriented to person, situation, location, year and month, intact concentration  Neuro: Detailed Neurologic Exam  Speech:    No aphasia, no dysarthria  Cranial Nerves:    The pupils are equal, round, and reactive to light. Fundi flat.  EOMI. No gaze preference. Visual fields full. Mild right asymmetry. Tongue midline. Hearing intact. Facial sensation intact.   Motor Observation:    no involuntary movements noted. Tone appears normal.     Strength:    Right-sided weakness 3/5 with poor effort, left 5/5      Sensation:  Intact to LT  Plantars downgoing.   Coordination: right-sided dysmetria     ASSESSMENT/PLAN Ms. Tarita Deshmukh is a 44 y.o. female with history of hypertension, previous strokes, mild MR at baseline, diabetes, and end-stage renal disease  presenting with "problems with her right side" and difficulty walking. She did not receive IV t-PA due to late presentation.   Stroke:  Acute brainstem infarct. Left paracentral pons lacune with no mass effect or hemorrhage.   Resultant  Right-sided hemiparesis and dysmetria  MRI  as above  MRA  not performed  Carotid Doppler pending  2D Echo  pending  LDL unable to calculate secondary to triglycerides of 526  HgbA1c pending  SCDs for VTE prophylaxis  Diet NPO time specified no liquids  no antithrombotic prior to admission, now on aspirin 300 mg suppository daily  Patient counseled to be compliant with her antithrombotic medications  Ongoing aggressive stroke risk factor management  Therapy recommendations:  Physical therapy recommends home health PT with 24-hour per day supervision.  Disposition: Pending  Hypertension  Home meds: Norvasc and lisinopril  Stable   Hyperlipidemia  Home meds:  No lipid lowering medications prior to admission.  LDL unable to calculate secondary to elevated triglycerides of 526, goal < 70  Now on Lipitor 80 mg daily and fenofibrate 160 mg daily  Continue statin at discharge  Diabetes  HgbA1c pending, goal < 7.0  Controlled  Other Stroke Risk Factors  Hx stroke/TIA   Other Active Problems    Other Pertinent History    Personally examined patient and images, and have documentes history, physical, neuro exam,assessment and plan as stated above.   Naomie Dean, MD Stroke Neurology [phone] [facility]   A total of 25 minutes was spent face-to-face with this patient. Over half this time was spent on counseling patient on the stroke  diagnosis and different diagnostic and therapeutic options available.          To contact Stroke Continuity provider, please refer to [email or web address]. After hours, contact General Neurology

## 2015-01-25 DIAGNOSIS — E1159 Type 2 diabetes mellitus with other circulatory complications: Secondary | ICD-10-CM

## 2015-01-25 DIAGNOSIS — N3000 Acute cystitis without hematuria: Secondary | ICD-10-CM

## 2015-01-25 DIAGNOSIS — F09 Unspecified mental disorder due to known physiological condition: Secondary | ICD-10-CM

## 2015-01-25 DIAGNOSIS — R531 Weakness: Secondary | ICD-10-CM | POA: Insufficient documentation

## 2015-01-25 DIAGNOSIS — I6302 Cerebral infarction due to thrombosis of basilar artery: Secondary | ICD-10-CM

## 2015-01-25 DIAGNOSIS — E785 Hyperlipidemia, unspecified: Secondary | ICD-10-CM

## 2015-01-25 LAB — CBC
HCT: 34 % — ABNORMAL LOW (ref 36.0–46.0)
Hemoglobin: 11.1 g/dL — ABNORMAL LOW (ref 12.0–15.0)
MCH: 31.1 pg (ref 26.0–34.0)
MCHC: 32.6 g/dL (ref 30.0–36.0)
MCV: 95.2 fL (ref 78.0–100.0)
Platelets: 477 10*3/uL — ABNORMAL HIGH (ref 150–400)
RBC: 3.57 MIL/uL — ABNORMAL LOW (ref 3.87–5.11)
RDW: 16 % — ABNORMAL HIGH (ref 11.5–15.5)
WBC: 10.9 10*3/uL — ABNORMAL HIGH (ref 4.0–10.5)

## 2015-01-25 LAB — GLUCOSE, CAPILLARY
Glucose-Capillary: 141 mg/dL — ABNORMAL HIGH (ref 70–99)
Glucose-Capillary: 143 mg/dL — ABNORMAL HIGH (ref 70–99)
Glucose-Capillary: 161 mg/dL — ABNORMAL HIGH (ref 70–99)
Glucose-Capillary: 184 mg/dL — ABNORMAL HIGH (ref 70–99)
Glucose-Capillary: 204 mg/dL — ABNORMAL HIGH (ref 70–99)
Glucose-Capillary: 209 mg/dL — ABNORMAL HIGH (ref 70–99)
Glucose-Capillary: 250 mg/dL — ABNORMAL HIGH (ref 70–99)

## 2015-01-25 LAB — ANTINUCLEAR ANTIBODIES, IFA: ANA Ab, IFA: NEGATIVE

## 2015-01-25 LAB — RENAL FUNCTION PANEL
Albumin: 3.1 g/dL — ABNORMAL LOW (ref 3.5–5.2)
Anion gap: 16 — ABNORMAL HIGH (ref 5–15)
BUN: 74 mg/dL — ABNORMAL HIGH (ref 6–23)
CO2: 17 mmol/L — ABNORMAL LOW (ref 19–32)
Calcium: 9 mg/dL (ref 8.4–10.5)
Chloride: 101 mmol/L (ref 96–112)
Creatinine, Ser: 9.73 mg/dL — ABNORMAL HIGH (ref 0.50–1.10)
GFR calc Af Amer: 5 mL/min — ABNORMAL LOW (ref >90)
GFR calc non Af Amer: 4 mL/min — ABNORMAL LOW (ref >90)
Glucose, Bld: 160 mg/dL — ABNORMAL HIGH (ref 70–99)
Phosphorus: 6.1 mg/dL — ABNORMAL HIGH (ref 2.3–4.6)
Potassium: 5.9 mmol/L — ABNORMAL HIGH (ref 3.5–5.1)
Sodium: 134 mmol/L — ABNORMAL LOW (ref 135–145)

## 2015-01-25 LAB — HEMOGLOBIN A1C
Hgb A1c MFr Bld: 7.8 % — ABNORMAL HIGH (ref 4.8–5.6)
MEAN PLASMA GLUCOSE: 177 mg/dL

## 2015-01-25 LAB — HEPATITIS B SURFACE ANTIGEN: Hepatitis B Surface Ag: NEGATIVE

## 2015-01-25 MED ORDER — DOCUSATE SODIUM 283 MG RE ENEM
1.0000 | ENEMA | RECTAL | Status: DC | PRN
  Filled 2015-01-25: qty 1

## 2015-01-25 MED ORDER — SORBITOL 70 % SOLN
30.0000 mL | Status: DC | PRN
  Administered 2015-01-25 – 2015-01-26 (×2): 30 mL via ORAL
  Filled 2015-01-25 (×5): qty 30

## 2015-01-25 MED ORDER — ONDANSETRON HCL 4 MG/2ML IJ SOLN
4.0000 mg | Freq: Four times a day (QID) | INTRAMUSCULAR | Status: DC | PRN
  Administered 2015-01-27 – 2015-01-30 (×4): 4 mg via INTRAVENOUS
  Filled 2015-01-25 (×2): qty 2

## 2015-01-25 MED ORDER — ACETAMINOPHEN 325 MG PO TABS
ORAL_TABLET | ORAL | Status: AC
  Filled 2015-01-25: qty 2

## 2015-01-25 MED ORDER — RESOURCE THICKENUP CLEAR PO POWD
ORAL | Status: DC | PRN
  Filled 2015-01-25: qty 125

## 2015-01-25 MED ORDER — HYDROXYZINE HCL 25 MG PO TABS: 25.0000 mg | ORAL_TABLET | Freq: Three times a day (TID) | ORAL | Status: DC | PRN

## 2015-01-25 MED ORDER — ALTEPLASE 2 MG IJ SOLR: 2.0000 mg | Freq: Once | INTRAMUSCULAR | Status: DC | PRN

## 2015-01-25 MED ORDER — HEPARIN SODIUM (PORCINE) 1000 UNIT/ML DIALYSIS: 1000.0000 [IU] | INTRAMUSCULAR | Status: DC | PRN

## 2015-01-25 MED ORDER — ONDANSETRON HCL 4 MG PO TABS: 4.0000 mg | ORAL_TABLET | Freq: Four times a day (QID) | ORAL | Status: DC | PRN

## 2015-01-25 MED ORDER — CALCIUM CARBONATE 1250 MG/5ML PO SUSP
500.0000 mg | Freq: Four times a day (QID) | ORAL | Status: DC | PRN
  Administered 2015-01-27: 500 mg via ORAL
  Filled 2015-01-25 (×2): qty 5

## 2015-01-25 MED ORDER — LIDOCAINE HCL (PF) 1 % IJ SOLN: 5.0000 mL | INTRAMUSCULAR | Status: DC | PRN

## 2015-01-25 MED ORDER — ZOLPIDEM TARTRATE 5 MG PO TABS: 5.0000 mg | ORAL_TABLET | Freq: Every evening | ORAL | Status: DC | PRN

## 2015-01-25 MED ORDER — PENTAFLUOROPROP-TETRAFLUOROETH EX AERO: 1.0000 "application " | INHALATION_SPRAY | CUTANEOUS | Status: DC | PRN

## 2015-01-25 MED ORDER — SODIUM CHLORIDE 0.9 % IV SOLN: 100.0000 mL | INTRAVENOUS | Status: DC | PRN

## 2015-01-25 MED ORDER — CAMPHOR-MENTHOL 0.5-0.5 % EX LOTN
1.0000 "application " | TOPICAL_LOTION | Freq: Three times a day (TID) | CUTANEOUS | Status: DC | PRN
  Filled 2015-01-25: qty 222

## 2015-01-25 MED ORDER — CLOPIDOGREL BISULFATE 75 MG PO TABS
75.0000 mg | ORAL_TABLET | Freq: Every day | ORAL | Status: DC
  Administered 2015-01-26 – 2015-01-27 (×2): 75 mg via ORAL
  Filled 2015-01-25 (×2): qty 1

## 2015-01-25 MED ORDER — ACETAMINOPHEN 325 MG PO TABS
650.0000 mg | ORAL_TABLET | ORAL | Status: DC | PRN
  Administered 2015-01-26 – 2015-01-30 (×5): 650 mg via ORAL
  Filled 2015-01-25 (×5): qty 2

## 2015-01-25 MED ORDER — NEPRO/CARBSTEADY PO LIQD
237.0000 mL | ORAL | Status: DC | PRN
  Filled 2015-01-25: qty 237

## 2015-01-25 MED ORDER — LIDOCAINE-PRILOCAINE 2.5-2.5 % EX CREA: 1.0000 "application " | TOPICAL_CREAM | CUTANEOUS | Status: DC | PRN

## 2015-01-25 NOTE — Progress Notes (Signed)
Patient left unit to HD at this time. Per Trula Orehristina, HD RN will run Rocephin the last hr of HD. Attending RN appreciated. TELE confirmed of patient's location at this time.   Sim BoastHavy, RN

## 2015-01-25 NOTE — Progress Notes (Signed)
Pt completed HD tx w/ no complications. Vss, alert, 2.2 L removed. Report called to Primary RN and CCMD notified.

## 2015-01-25 NOTE — Progress Notes (Signed)
[facility] PROGRESS NOTE  Mahoganie Basher ZOX:[id number] DOB: Jun 03, 1971 DOA: 01/23/2015 PCP: No primary care provider on file.  Assessment/Plan: #1 acute brainstem infarct. Left paracentral troponins lacunar with no mass effect or hemorrhage Prior MRI head. Patient also noted to have chronic lacunar infarcts of the right pons and left basal ganglia/corona radiata. Patient with dysmetria and right-sided weakness with some dysphagia. Carotid Dopplers with no significant ICA stenosis from preliminary reading. 2-D echo is pending. Unable to calculate LDL secondary to triglycerides of 526. Hemoglobin A1c is 7.8. Continue statin and TriCor. Continue aspirin for secondary stroke prevention. PT/OT/ST. Neurology following and appreciate input and recommendations.  #2 hypertension Stable. Continue Norvasc and lisinopril.  #3 hypothyroidism TSH elevated at 18.051. Continue Synthroid 25 g daily. Will need outpatient follow-up.  #4 probably urinary tract infection Urine cultures pending. Continue IV Rocephin. Follow.  #5 diabetes mellitus Hemoglobin A1c = 7.8. CBGs 161-254. Continue sliding scale insulin.  #6 end-stage renal disease on hemodialysis Tuesdays Thursdays Saturdays Per nephrology.  #7 hyperkalemia Likely secondary to problem #6.On HD.  Per nephrology.  #8 Hyperlipidemia Cholesterol = 305, TG 526. Continue statin and tricor.  #9 leukocytosis Likely secondary to UTI. Urine cultures pending. Chest x-ray negative for any acute infiltrates. WBC trending down. Continue IV Rocephin. Follow.  #10 prophylaxis SCDs for DVT prophylaxis.  Code Status: Full Family Communication: Updated patient in HD via interpreter. Disposition Plan: Home with home health when medically stable.   Consultants:  Nephrology: Dr. Arlean Hopping 01/24/2015  Neurology: Dr. Amada Jupiter 01/23/2015  Procedures:  CT head 01/23/2015  Chest x-ray 01/23/2015  MRI head 01/23/2015  Carotid dopplers  01/24/15  2 d echo pending  Antibiotics:  Oral Cipro 01/23/15 >>>01/24/15  IV Rocephin 01/24/15  HPI/Subjective: Patient c/o HA. Patient still with RUE weakness. In HD.  Objective: Filed Vitals:   01/25/15 1030  BP: 123/81  Pulse: 67  Temp:   Resp: 18    Intake/Output Summary (Last 24 hours) at 01/25/15 1108 Last data filed at 01/25/15 0144  Gross per 24 hour  Intake      0 ml  Output    125 ml  Net   -125 ml   Filed Weights   01/23/15 2102 01/25/15 0810  Weight: 60.555 kg (133 lb 8 oz) 62.5 kg (137 lb 12.6 oz)    Exam:   General:  NAD  Cardiovascular: RRR  Respiratory: CTAB anterior lung fields  Abdomen: Soft, nontender, nondistended, positive bowel sounds.  Musculoskeletal: No clubbing cyanosis or edema.  Data Reviewed: Basic Metabolic Panel:  Recent Labs Lab 01/23/15 1150 01/23/15 1159 01/24/15 0445 01/25/15 0830  NA 134* 133* 135 134*  K 4.5 4.5 5.2* 5.9*  CL 94* 99 100 101  CO2 22  --  23 17*  GLUCOSE 152* 159* 128* 160*  BUN 43* 41* 52* 74*  CREATININE 7.21* 7.30* 8.30* 9.73*  CALCIUM 9.8  --  9.1 9.0  PHOS  --   --   --  6.1*   Liver Function Tests:  Recent Labs Lab 01/23/15 1150 01/24/15 0445 01/25/15 0830  AST 25 23  --   ALT 23 22  --   ALKPHOS 115 94  --   BILITOT 0.7 0.6  --   PROT 8.2 7.5  --   ALBUMIN 3.8 3.2* 3.1*   No results for input(s): LIPASE, AMYLASE in the last 168 hours. No results for input(s): AMMONIA in the last 168 hours. CBC:  Recent Labs Lab 01/23/15 1150 01/23/15 1159 01/24/15  0445 01/25/15 0830  WBC 13.0*  --  11.1* 10.9*  NEUTROABS 8.9*  --   --   --   HGB 12.7 13.6 11.4* 11.1*  HCT 37.6 40.0 34.8* 34.0*  MCV 95.4  --  96.1 95.2  PLT 464*  --  447* 477*   Cardiac Enzymes:  Recent Labs Lab 01/23/15 1150  TROPONINI <0.03   BNP (last 3 results) No results for input(s): BNP in the last 8760 hours.  ProBNP (last 3 results) No results for input(s): PROBNP in the last 8760  hours.  CBG:  Recent Labs Lab 01/24/15 1217 01/24/15 1630 01/24/15 2004 01/25/15 0008 01/25/15 0415  GLUCAP 198* 141* 254* 184* 161*    No results found for this or any previous visit (from the past 240 hour(s)).   Studies: Dg Chest 2 View  01/23/2015   CLINICAL DATA:  Slurred speech and generalized weakness  EXAM: CHEST  2 VIEW  COMPARISON:  None.  FINDINGS: Lungs are clear. Heart is mildly enlarged with pulmonary vascularity within normal limits. No adenopathy. No bone lesions.  IMPRESSION: Mild cardiomegaly.  No edema or consolidation.   Electronically Signed   By: Bretta BangWilliam  Woodruff III M.D.   On: 01/23/2015 13:14   Ct Head Wo Contrast  01/23/2015   CLINICAL DATA:  Generalized weakness, dysphagia  EXAM: CT HEAD WITHOUT CONTRAST  TECHNIQUE: Contiguous axial images were obtained from the base of the skull through the vertex without intravenous contrast.  COMPARISON:  None.  FINDINGS: No skull fracture is noted. Paranasal sinuses and mastoid air cells are unremarkable. No intracranial hemorrhage, mass effect or midline shift.  No acute infarction. No mass lesion is noted on this unenhanced scan. No hydrocephalus. The gray and white-matter differentiation is preserved.  IMPRESSION: No acute intracranial abnormality.   Electronically Signed   By: Natasha MeadLiviu  Pop M.D.   On: 01/23/2015 12:28   Mr Brain Wo Contrast  01/23/2015   CLINICAL DATA:  44 year old female with weakness, slurred speech, difficulty swallowing. Initial encounter.  EXAM: MRI HEAD WITHOUT CONTRAST  TECHNIQUE: Multiplanar, multiecho pulse sequences of the brain and surrounding structures were obtained without intravenous contrast.  COMPARISON:  Head CT without contrast 1206 hours today.  FINDINGS: Restricted diffusion in a patchy and linear distribution in the left paracentral pons (series 4, image 15 and series 8, image 16). Nearby small right paracentral chronic pontine lacunar infarct (series 5, image 10) with hemosiderin. No  hemorrhage associated with the acute lacune. T2 and FLAIR hyperintensity with no significant mass effect.  No other restricted diffusion. Major intracranial vascular flow voids are within normal limits.  Left corona radiata and lentiform nuclei a chronic lacunar infarct(s). No cortical encephalomalacia identified. No other chronic blood products identified. No midline shift, mass effect, evidence of mass lesion, ventriculomegaly, extra-axial collection or acute intracranial hemorrhage. Cervicomedullary junction and pituitary are within normal limits.  Normal bone marrow signal. Negative visible cervical spine. Visible internal auditory structures appear normal. Visualized paranasal sinuses and mastoids are clear. Visualized orbit soft tissues are within normal limits. Visualized scalp soft tissues are within normal limits.  IMPRESSION: 1. Acute brainstem infarct. Left paracentral pons lacune with no mass effect or hemorrhage. 2. 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 Chronic microhemorrhage associated with the former.   Electronically Signed   By: Odessa FlemingH  Hall M.D.   On: 01/23/2015 17:07    Scheduled Meds: . acetaminophen      . amLODipine  10 mg Oral  Daily  . aspirin  300 mg Rectal Daily   Or  . aspirin  325 mg Oral Daily  . atorvastatin  80 mg Oral q1800  . calcitRIOL  0.25 mcg Oral Q M,W,F-HD  . cefTRIAXone (ROCEPHIN)  IV  1 g Intravenous Q24H  . fenofibrate  160 mg Oral Daily  . insulin aspart  0-5 Units Subcutaneous QHS  . insulin aspart  0-9 Units Subcutaneous TID WC  . levothyroxine  25 mcg Oral QAC breakfast  . lisinopril  40 mg Oral QHS  . sevelamer carbonate  800 mg Oral BID WC  . sodium chloride  3 mL Intravenous Q12H   Continuous Infusions:   Principal Problem:   Brainstem stroke Active Problems:   ESRD (end stage renal disease)   Diabetes   CVA (cerebral infarction)   Life long cognitive dysfunction   Hypothyroidism   Hyperlipidemia    Anemia   Hyperkalemia   Leukocytosis   UTI (urinary tract infection)    Time spent: 40 minutes    [facility] MD [facility] Pager [phone]. If 7PM-7AM, please contact night-coverage at [email or web address], password [facility] 01/25/2015, 11:08 AM  LOS: 2 days

## 2015-01-25 NOTE — Progress Notes (Signed)
Occupational Therapy Cancellation Note:  OT eval cancelled this am due to pt in HD.  Will reattempt.    01/25/15 1000  OT Visit Information  Last OT Received On 01/25/15  Reason Eval/Treat Not Completed Patient at procedure or test/ unavailable  Jeani HawkingWendi Brecklynn Jian, OTR/L [phone]

## 2015-01-25 NOTE — Progress Notes (Addendum)
Physical Therapy Treatment Patient Details Name: Natalie [name] MRN: [id number] DOB: 1971/06/24 Today's Date: 01/25/2015    History of Present Illness 44 yo female, with history of HTN, DM, and ESRD admitted for CVA, difficulty speaking, swallowing, numbness, and nausea on admit. MRI- + Acute brainstem infarct. Left paracentral pons lacune with no mass effect or hemorrhage.    PT Comments    Patient progressing slowly with mobility. Slightly impulsive and requires Min A to maintain balance/safety during gait. Decreased foot clearance RLE most notable when fatigued resulting in tripping over right foot at times. Discharge recommendation updated to CIR as pt completely independent PTA.  If pt does go home, will need to arrange for 24/7 S and HHPT. Will continue to follow per current POC.   Follow Up Recommendations  CIR;Supervision/Assistance - 24 hour     Equipment Recommendations  Other (comment) (TBD.)    Recommendations for Other Services Rehab consult     Precautions / Restrictions Precautions Precautions: Fall Restrictions Weight Bearing Restrictions: No    Mobility  Bed Mobility Overal bed mobility: Needs Assistance Bed Mobility: Supine to Sit;Sit to Supine     Supine to sit: Min assist Sit to supine: Min guard   General bed mobility comments: Increased time to get to EOB.   Transfers Overall transfer level: Needs assistance Equipment used: 1 person hand held assist Transfers: Sit to/from Stand Sit to Stand: Min guard         General transfer comment: Mildly unsteady in standing.   Ambulation/Gait Ambulation/Gait assistance: Min assist Ambulation Distance (Feet): 125 Feet Assistive device: None Handheld assist.  Gait Pattern/deviations: Step-through pattern;Decreased stride length;Decreased dorsiflexion - right;Staggering left;Staggering right;Drifts right/left     General Gait Details: Pt with decreased foot clearance RLE with a few instances of  tripping over foot when fatigued. Downward gaze during gait. Cues to slow down and for straight gaze. Very unsteady- needing constant Min A for support/safety.   Stairs            Wheelchair Mobility    Modified Rankin (Stroke Patients Only) Modified Rankin (Stroke Patients Only) Pre-Morbid Rankin Score: No symptoms Modified Rankin: Moderately severe disability     Balance Overall balance assessment: Needs assistance Sitting-balance support: Feet supported;No upper extremity supported Sitting balance-Leahy Scale: Fair     Standing balance support: During functional activity Standing balance-Leahy Scale: Poor Standing balance comment: Requires UE support to maintain balance.                     Cognition Arousal/Alertness: Awake/alert Behavior During Therapy: WFL for tasks assessed/performed Overall Cognitive Status: Difficult to assess                      Exercises      General Comments        Pertinent Vitals/Pain Pain Assessment: Faces Faces Pain Scale: Hurts little more Pain Location: RUE Pain Descriptors / Indicators: Burning Pain Intervention(s): Monitored during session    Home Living     Available Help at Discharge: Family Type of Home: House              Prior Function            PT Goals (current goals can now be found in the care plan section) Progress towards PT goals: Progressing toward goals    Frequency  Min 4X/week    PT Plan Discharge plan needs to be updated    Co-evaluation  End of Session Equipment Utilized During Treatment: Gait belt Activity Tolerance: Patient tolerated treatment well Patient left: in bed;with call bell/phone within reach;with bed alarm set     Time: 1521-1540 PT Time Calculation (min) (ACUTE ONLY): 19 min  Charges:  $Gait Training: 8-22 mins                    G CodesAlvie Heidelberg:      Folan, Albena Comes A 01/25/2015, 3:53 PM Alvie HeidelbergShauna Folan, PT, DPT [phone]

## 2015-01-25 NOTE — Care Management Note (Signed)
    Page 1 of 1   01/25/2015     4:25:34 PM CARE MANAGEMENT NOTE 01/25/2015  Patient:  Natalie [name],Natalie [name]   Account Number:  [id number]  Date Initiated:  01/25/2015  Documentation initiated by:  Elmer BalesOBARGE,Huntley Knoop  Subjective/Objective Assessment:   Patient was admitted with CVA.  Lives at home with relatives. Spanish speaking.     Action/Plan:   Will follow for discharge needs pending PT/OT evals and physician orders.   Anticipated DC Date:     Anticipated DC Plan:  IP REHAB FACILITY         Choice offered to / List presented to:             Status of service:  In process, will continue to follow Medicare Important Message given?   (If response is "NO", the following Medicare IM given date fields will be blank) Date Medicare IM given:   Medicare IM given by:   Date Additional Medicare IM given:   Additional Medicare IM given by:    Discharge Disposition:    Per UR Regulation:  Reviewed for med. necessity/level of care/duration of stay  If discussed at Long Length of Stay Meetings, dates discussed:    Comments:

## 2015-01-25 NOTE — Progress Notes (Signed)
Rehab Admissions Coordinator Note:  Patient was screened by Rose Hippler L for appropriateness for an Inpatient Acute Rehab Consult.  At this time, we are recommending Inpatient Rehab consult.  Eustacio Ellen L 01/25/2015, 4:16 PM  I can be reached at [phone].

## 2015-01-25 NOTE — Procedures (Signed)
Patient was seen on dialysis and the procedure was supervised.  BFR 400  Via AVF BP is  139/82.   Patient appears to be tolerating treatment well  Natalie [name] A 01/25/2015

## 2015-01-25 NOTE — Progress Notes (Signed)
UR complete.  Brock Larmon RN, MSN 

## 2015-01-25 NOTE — Progress Notes (Signed)
Patient arrived back to room from HD. Niece Kathrine Cords([name]) at bedside and able to translate for writer to complete daily assessment. Stroke team at bedside.  Sim BoastHavy, RN

## 2015-01-25 NOTE — Progress Notes (Signed)
Speech Language Pathology Treatment: Dysphagia  Patient Details Name: Natalie [name] MRN: [id number] DOB: 13-Apr-1971 Today's Date: 01/25/2015 Time: 1345-1400 SLP Time Calculation (min) (ACUTE ONLY): 15 min  Assessment / Plan / Recommendation Clinical Impression  Pt demonstrates adequate tolerance of dys 3/nectar thick liquids without signs of aspiration. Pt is able to self feed, but needs moderate verbal and tactile cues to reduce bolus size, and slow rate of intake as pt is impulsive with her meal. SLP will f/u for thin liquid trials and possible need for objective test.    HPI HPI: Pt is a 44 y.o. female admitted with dysarthria, trouble swallowing, and right sided weakness. 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  Medical history significant for HTN, diabetes, end stage renal disease, and intellectual disability.   Pt with a history of low literacy, no education and is non [place] speaking.   Pertinent Vitals    SLP Plan  Continue with current plan of care    Recommendations Diet recommendations: Dysphagia 3 (mechanical soft);Nectar-thick liquid Liquids provided via: Cup Medication Administration: Whole meds with puree Supervision: Full supervision/cueing for compensatory strategies Compensations: Slow rate;Small sips/bites;Follow solids with liquid Postural Changes and/or Swallow Maneuvers: Upright 30-60 min after meal;Seated upright 90 degrees              Follow up Recommendations: Home health SLP Plan: Continue with current plan of care    GO    Woodcrest Surgery CenterBonnie Tanija Germani, MA CCC-SLP [phone]  Claudine MoutonDeBlois, Miron Marxen Caroline 01/25/2015, 2:48 PM

## 2015-01-25 NOTE — Evaluation (Signed)
Speech Language Pathology Evaluation Patient Details Name: Danaye Sobh MRN: [id number] DOB: 1970-12-16 Today's Date: 01/25/2015 Time: 4098-1191 SLP Time Calculation (min) (ACUTE ONLY): 39 min  Problem List:  Patient Active Problem List   Diagnosis Date Noted  . Weakness   . Life long cognitive dysfunction 01/24/2015  . Hypothyroidism 01/24/2015  . Hyperlipidemia 01/24/2015  . Anemia 01/24/2015  . Hyperkalemia 01/24/2015  . Leukocytosis 01/24/2015  . UTI (urinary tract infection) 01/24/2015  . Brainstem stroke 01/23/2015  . ESRD (end stage renal disease) 01/23/2015  . Diabetes 01/23/2015  . CVA (cerebral infarction) 01/23/2015   Past Medical History:  Past Medical History  Diagnosis Date  . Hypertension   . Diabetes mellitus without complication   . Hyperlipidemia 01/24/2015  . ESRD on hemodialysis started 02/25/2013    [facility] -   . Anemia of chronic disease   . Secondary hyperparathyroidism    Past Surgical History:  Past Surgical History  Procedure Laterality Date  . Insertion of dialysis catheter     HPI:  Pt is a 44 y.o. female admitted with dysarthria, trouble swallowing, and right sided weakness. 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  Medical history significant for HTN, diabetes, end stage renal disease, and intellectual disability.   Pt with a history of low literacy, no education and is non [place] speaking.   Assessment / Plan / Recommendation Clinical Impression   Pt demonstrates adequate speech and receptive language skills. Pt demonstrates moderate impairment of speech intelligibility characterized by slow rate and reduced intelligibility (word level) consistent with a diagnosis of dysarthria. Per caregiver report, pt unable to read with reduced writing ability and cognitive deficits in the areas of memory at baseline. Unable to determine current level of  impairment given baseline deficits. Discussed follow-up care with caregiver; recommendation for home health SLP. Caregiver reports her desire to take pt home with her; she currently lives out of state ([place]) and would like information regarding services available in that state. Recommended pt discuss questions with Case Manager and Child psychotherapist. Speech will follow-up with dysarthria treatment.     SLP Assessment  Patient needs continued Speech Lanaguage Pathology Services    Follow Up Recommendations  Home health SLP    Frequency and Duration min 2x/week  2 weeks   Pertinent Vitals/Pain     SLP Goals  Potential to Achieve Goals (ACUTE ONLY): Fair Potential Considerations (ACUTE ONLY): Ability to learn/carryover information;Previous level of function  SLP Evaluation Prior Functioning  Cognitive/Linguistic Baseline: Baseline deficits Baseline deficit details: Pt has learning disability. Cannot read. Poor memory. Type of Home: House  Lives With: Family;Other (Comment) (Sister and Niece.) Available Help at Discharge: Family   Cognition  Overall Cognitive Status: History of cognitive impairments - at baseline Arousal/Alertness: Awake/alert Orientation Level: Oriented to place;Oriented to time;Oriented to situation Attention: Focused;Sustained Focused Attention: Appears intact Sustained Attention: Appears intact Memory: Appears intact (Similar to baseline) Awareness: Impaired Awareness Impairment: Emergent impairment;Anticipatory impairment Executive Function: Self Monitoring;Sequencing Sequencing: Appears intact Self Monitoring: Impaired Self Monitoring Impairment: Verbal basic Behaviors: Impulsive Safety/Judgment: Impaired    Comprehension  Auditory Comprehension Overall Auditory Comprehension: Appears within functional limits for tasks assessed Yes/No Questions: Within Functional Limits Commands: Within Functional Limits Conversation: Simple EffectiveTechniques:  Repetition;Pausing;Extra processing time Visual Recognition/Discrimination Discrimination: Within Function Limits Reading Comprehension Reading Status: Not tested (Pt unable to read at baseline.)    Expression Expression Primary Mode of Expression:  Verbal Verbal Expression Overall Verbal Expression: Impaired Initiation: No impairment Automatic Speech: Counting;Day of week Level of Generative/Spontaneous Verbalization: Word Repetition: No impairment Naming: No impairment Interfering Components: Speech intelligibility Written Expression Dominant Hand: Right Written Expression: Not tested (Pt refused to attempt writing. Baseline deficits prsent. )   Oral / Motor Oral Motor/Sensory Function Overall Oral Motor/Sensory Function: Impaired Labial ROM: Reduced right Labial Symmetry: Abnormal symmetry right Labial Strength: Reduced Labial Sensation: Reduced Lingual ROM: Reduced right Lingual Symmetry: Abnormal symmetry right Lingual Strength: Reduced Lingual Sensation: Reduced Facial Symmetry: Right droop Motor Speech Overall Motor Speech: Impaired Respiration: Within functional limits Phonation: Normal Resonance: Within functional limits Articulation: Impaired Level of Impairment: Word Intelligibility: Intelligibility reduced Word: 50-74% accurate Phrase: 25-49% accurate Sentence: 25-49% accurate Conversation: 25-49% accurate Motor Planning: Witnin functional limits   GO     Bermudez-Bosch, Deborrah Mabin 01/25/2015, 2:42 PM

## 2015-01-25 NOTE — Progress Notes (Signed)
STROKE TEAM PROGRESS NOTE   HISTORY [name] is a 44 y.o. female with a history of dysarthria and weakness since yesterday (LKW 01/22/3015 at 10a). She states that she has noticed more problems with her right side than she had previously and has had some difficulty walking. She has a history of mild MR at baseline. She was brought in today 01/23/2015 given the persistent symptoms. She denies diplopia. She was not a tpa candidate as outside of window.   SUBJECTIVE (INTERVAL HISTORY) Her niece is at the bedside. Patient just back from HD. Did well per niece. Dr. Roda ShuttersXu addressed and answered multiple questions from the niece. Pt still has right UE weakness but able to walk with PT. Pt not compliant with meds, did not monitor glucose and BP at home.    OBJECTIVE Temp:  [97.1 F (36.2 C)-98.7 F (37.1 C)] 98 F (36.7 C) (03/28 1243) Pulse Rate:  [64-86] 86 (03/28 1243) Cardiac Rhythm:  [-] Normal sinus rhythm;Sinus bradycardia (03/28 1203) Resp:  [13-21] 20 (03/28 1243) BP: (121-150)/(55-82) 127/74 mmHg (03/28 1243) SpO2:  [96 %-99 %] 96 % (03/28 1243) Weight:  [60.6 kg (133 lb 9.6 oz)-62.5 kg (137 lb 12.6 oz)] 60.6 kg (133 lb 9.6 oz) (03/28 1205)   Recent Labs Lab 01/24/15 1217 01/24/15 1630 01/24/15 2004 01/25/15 0008 01/25/15 0415  GLUCAP 198* 141* 254* 184* 161*    Recent Labs Lab 01/23/15 1150 01/23/15 1159 01/24/15 0445 01/25/15 0830  NA 134* 133* 135 134*  K 4.5 4.5 5.2* 5.9*  CL 94* 99 100 101  CO2 22  --  23 17*  GLUCOSE 152* 159* 128* 160*  BUN 43* 41* 52* 74*  CREATININE 7.21* 7.30* 8.30* 9.73*  CALCIUM 9.8  --  9.1 9.0  PHOS  --   --   --  6.1*    Recent Labs Lab 01/23/15 1150 01/24/15 0445 01/25/15 0830  AST 25 23  --   ALT 23 22  --   ALKPHOS 115 94  --   BILITOT 0.7 0.6  --   PROT 8.2 7.5  --   ALBUMIN 3.8 3.2* 3.1*    Recent Labs Lab 01/23/15 1150 01/23/15 1159 01/24/15 0445 01/25/15 0830  WBC 13.0*  --  11.1* 10.9*  NEUTROABS  8.9*  --   --   --   HGB 12.7 13.6 11.4* 11.1*  HCT 37.6 40.0 34.8* 34.0*  MCV 95.4  --  96.1 95.2  PLT 464*  --  447* 477*    Recent Labs Lab 01/23/15 1150  TROPONINI <0.03    Recent Labs  01/23/15 1150  LABPROT 13.6  INR 1.03    Recent Labs  01/23/15 1255  COLORURINE YELLOW  LABSPEC 1.010  PHURINE 7.0  GLUCOSEU 100*  HGBUR TRACE*  BILIRUBINUR NEGATIVE  KETONESUR NEGATIVE  PROTEINUR 30*  UROBILINOGEN 0.2  NITRITE NEGATIVE  LEUKOCYTESUR LARGE*       Component Value Date/Time   CHOL 305* 01/24/2015 0445   TRIG 526* 01/24/2015 0445   HDL 35* 01/24/2015 0445   CHOLHDL 8.7 01/24/2015 0445   VLDL UNABLE TO CALCULATE IF TRIGLYCERIDE OVER 400 mg/dL 21/30/865703/27/2016 [id number]   LDLCALC UNABLE TO CALCULATE IF TRIGLYCERIDE OVER 400 mg/dL 62/95/284103/27/2016 [id number]   Lab Results  Component Value Date   HGBA1C 7.8* 01/24/2015      Component Value Date/Time   LABOPIA NONE DETECTED 01/23/2015 1255   COCAINSCRNUR NONE DETECTED 01/23/2015 1255   LABBENZ NONE DETECTED 01/23/2015 1255   AMPHETMU  NONE DETECTED 01/23/2015 1255   THCU NONE DETECTED 01/23/2015 1255   LABBARB NONE DETECTED 01/23/2015 1255     Recent Labs Lab 01/23/15 1150  ETH <5   I have personally reviewed the radiological images below and agree with the radiology interpretations.  Dg Chest 2 View 01/23/2015    Mild cardiomegaly.  No edema or consolidation.    Ct Head Wo Contrast 01/23/2015    No acute intracranial abnormality.     Mr Brain Wo Contrast 01/23/2015    1. Acute brainstem infarct. Left paracentral pons lacune with no mass effect or hemorrhage.  2. 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 Chronic microhemorrhage associated with the former.    Carotid Doppler  There is 1-39% bilateral ICA stenosis. Vertebral artery flow is antegrade.    2D Echocardiogram  pending   PHYSICAL EXAM Gen: NAD Eyes: anicteric sclerae, moist conjunctivae                    CV: no  MRG, no carotid bruits, no peripheral edema Mental Status: Alert, oriented to person, situation, location, year and month, intact concentration, spanish-speaking, niece as interpreter. Neuro: Detailed Neurologic Exam Speech:    No aphasia, no dysarthria Cranial Nerves:    The pupils are equal, round, and reactive to light. Fundi flat.  EOMI. No gaze preference. Visual fields full. Mild right asymmetry. Tongue midline. Hearing intact. Facial sensation intact.  Motor Observation:    no involuntary movements noted. Tone appears normal.  Strength:    Right-sided weakness 2/5 RUE and 1/5 RLE proximal and 3/5 distal, all with poor effort, left 5/5 Sensation:  Intact to LT Plantars downgoing.  Coordination: right-sided dysmetria Gait: not tested due to weakness   ASSESSMENT/PLAN Ms. Wessie Shanks is a 44 y.o. female with history of hypertension, previous strokes, mild MR at baseline, diabetes, and ESRD on HD  presenting with "problems with her right side" and difficulty walking. She did not receive IV t-PA due to late presentation.   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    2D Echo  pending  SCDs for VTE prophylaxis  DIET DYS 3 Room service appropriate?: Yes; Fluid consistency:: Nectar Thick  no antithrombotic prior to admission, now on aspirin 325 mg orally every day. As now taking POs, will change aspirin to plavix for secondary stroke prevention  Patient counseled to be compliant with her antithrombotic medications  Ongoing aggressive stroke risk factor management  Therapy recommendations:  Physical therapy recommends home health PT with 24-hour per day supervision.  Disposition: Pending  Hypertension  Home meds: Norvasc and lisinopril  Stable  Resumed home meds during admission Permissive hypertension (OK if  <220/120) for 24-48 hours post stroke and then gradually normalized within 5-7 days.  Hyperlipidemia  Home meds:  No lipid lowering medications PTA.  LDL unable to calculate secondary to elevated triglycerides of 526, goal < 70  Now on Lipitor 80 mg daily and fenofibrate 160 mg daily  Continue both at discharge  Diabetes  HgbA1c 7.8, goal < 7.0  Uncontrolled  SSI  DM education  Manage as per primary team  Follow up with PCP closely as outpt  Other Stroke Risk Factors  Hx stroke/TIA  Other Active Problems  Hypothyroidism, TSH 18.051 on synthroid  probable UTI on rocephin. Leukocytosis likely related  ESRD on HD  anemia  hyperkalemia  Hospital day #2   [facility] See Amion for Pager information 01/25/2015 4:00 PM  I, the attending vascular neurologist, have personally obtained a history, examined the patient, evaluated laboratory data, individually viewed imaging studies and agree with radiology interpretations. I also obtained additional history from pt's niece at bedside. Together with the NP/PA, we formulated the assessment and plan of care which reflects our mutual decision.  I have made any additions or clarifications directly to the above note and agree with the findings and plan as currently documented.   44 yo with multiple stroke risk factors including DM, HTN, HLD not complaint with meds and no monitoring at home was admitted for brainstem stroke due to small vessel disease. A1C 7.8 and significant high TG and LDL, put on lipitor and fenofibrate. Recommend switch from ASA to plavix. And aggressive risk factor modification. She needs aggressive PT/OT also. Await CIR placement.  Neurology will sign off. Please call with questions. Pt will follow up with Dr. Roda Shutters at [facility] in about 2 months. Thanks for the consult.  Marvel Plan, MD PhD Stroke Neurology 01/25/2015 10:02 PM       To contact Stroke Continuity provider, please refer  to [email or web address]. After hours, contact General Neurology

## 2015-01-25 NOTE — Progress Notes (Signed)
[facility] Progress Note  Assessment/Plan: 1. Acute brainstem (pons) infarct with history of chronic lacunar infracts - work up per Neuro - so far Marland Kitchen Chol,^^^ TG - started statin, PT worked with pt this am - said some balance issues but pt said that wasn't new - he felt she needed ongoing PT.  2. ESRD - TTS - last HD Thursday via AVF - HD today - K 5.2 Sunday, 5.9 this AM -  - plan next HD Wed off schedule 3. Hypertension/volume - CXR NAD on admission - BP 120 - 150 - in usual range; she continues on norvasc/lisinopril - her usual outpt BP meds 4. Anemia - Hgb 11.4 Sunday - hold on dosing ESA for now/ no Fe 5. Metabolic bone disease - Continue calcitriol at current dose - if corrected Ca stays down, might ^ to 0.5- binders when eating. P well controlled 6. Nutrition - D3 diet - 7. DM - on glucotrol as an outpt - per primary - BS 160 - 250 on SSI 8. Hypothyroidism - TSH -18 -synthroid started  Sheffield Slider, PA-C  [facility] [phone] 01/25/2015,8:38 AM  LOS: 2 days    Patient seen and examined, agree with above note with above modifications. Seen on HD- a little lethargic- hemodaynamics good- have her off schedule next to be done on Wednesday- not sure what end point is Annie Sable, MD 01/25/2015     Subjective:   Denies pain- a little lethargic- some secretions in mouth  Objective Filed Vitals:   01/25/15 0123 01/25/15 0417 01/25/15 0810 01/25/15 0815  BP: 129/55 129/66 141/79 135/79  Pulse: 79 76 74 69  Temp: 98.2 F (36.8 C) 98.1 F (36.7 C) 97.1 F (36.2 C)   TempSrc: Oral Oral Oral   Resp: Height:      Weight:   62.5 kg (137 lb 12.6 oz)   SpO2: 96% 97% 97% 97%   Physical Exam on HD goal 2.6  General: NAD Heart: RRR  Lungs: no rales Abdomen: soft ND + BS Extremities: no LE edema Dialysis Access: right upper AVF Qb 400  Dialysis Orders: [facility] 4 hr EDW 60.5 180 440/A 1.5 2K 2 Ca AVF no profile  Mircera 50 q 4 weeks heparin 3500 calcitriol 0.25 - recently lowered from 0.75 due to ^ corr Ca iPTH 639 3/24 hgb 11.4 17%sat 3/24 - ferritin 1473 10/2014  Additional Objective Labs: Basic Metabolic Panel:  Recent Labs Lab 01/23/15 1150 01/23/15 1159 01/24/15 0445  NA 134* 133* 135  K 4.5 4.5 5.2*  CL 94* 99 100  CO2 22  --  23  GLUCOSE 152* 159* 128*  BUN 43* 41* 52*  CREATININE 7.21* 7.30* 8.30*  CALCIUM 9.8  --  9.1   Liver Function Tests:  Recent Labs Lab 01/23/15 1150 01/24/15 0445  AST 25 23  ALT 23 22  ALKPHOS 115 94  BILITOT 0.7 0.6  PROT 8.2 7.5  ALBUMIN 3.8 3.2*   CBC:  Recent Labs Lab 01/23/15 1150 01/23/15 1159 01/24/15 0445  WBC 13.0*  --  11.1*  NEUTROABS 8.9*  --   --   HGB 12.7 13.6 11.4*  HCT 37.6 40.0 34.8*  MCV 95.4  --  96.1  PLT 464*  --  447*    Cardiac Enzymes:  Recent Labs Lab 01/23/15 1150  TROPONINI <0.03   CBG:  Recent Labs Lab 01/24/15 0835 01/24/15 1217 01/24/15 2004 01/25/15 0008 01/25/15 0415  GLUCAP  144* 198* 254* 184* 161*  Studies/Results: Dg Chest 2 View  01/23/2015   CLINICAL DATA:  Slurred speech and generalized weakness  EXAM: CHEST  2 VIEW  COMPARISON:  None.  FINDINGS: Lungs are clear. Heart is mildly enlarged with pulmonary vascularity within normal limits. No adenopathy. No bone lesions.  IMPRESSION: Mild cardiomegaly.  No edema or consolidation.   Electronically Signed   By: Bretta BangWilliam  Woodruff III M.D.   On: 01/23/2015 13:14   Ct Head Wo Contrast  01/23/2015   CLINICAL DATA:  Generalized weakness, dysphagia  EXAM: CT HEAD WITHOUT CONTRAST  TECHNIQUE: Contiguous axial images were obtained from the base of the skull through the vertex without intravenous contrast.  COMPARISON:  None.  FINDINGS: No skull fracture is noted. Paranasal sinuses and mastoid air cells are unremarkable. No intracranial hemorrhage, mass effect or midline shift.  No acute infarction. No mass lesion is noted on this unenhanced scan. No  hydrocephalus. The gray and white-matter differentiation is preserved.  IMPRESSION: No acute intracranial abnormality.   Electronically Signed   By: Natasha MeadLiviu  Pop M.D.   On: 01/23/2015 12:28   Mr Brain Wo Contrast  01/23/2015   CLINICAL DATA:  44 year old female with weakness, slurred speech, difficulty swallowing. Initial encounter.  EXAM: MRI HEAD WITHOUT CONTRAST  TECHNIQUE: Multiplanar, multiecho pulse sequences of the brain and surrounding structures were obtained without intravenous contrast.  COMPARISON:  Head CT without contrast 1206 hours today.  FINDINGS: Restricted diffusion in a patchy and linear distribution in the left paracentral pons (series 4, image 15 and series 8, image 16). Nearby small right paracentral chronic pontine lacunar infarct (series 5, image 10) with hemosiderin. No hemorrhage associated with the acute lacune. T2 and FLAIR hyperintensity with no significant mass effect.  No other restricted diffusion. Major intracranial vascular flow voids are within normal limits.  Left corona radiata and lentiform nuclei a chronic lacunar infarct(s). No cortical encephalomalacia identified. No other chronic blood products identified. No midline shift, mass effect, evidence of mass lesion, ventriculomegaly, extra-axial collection or acute intracranial hemorrhage. Cervicomedullary junction and pituitary are within normal limits.  Normal bone marrow signal. Negative visible cervical spine. Visible internal auditory structures appear normal. Visualized paranasal sinuses and mastoids are clear. Visualized orbit soft tissues are within normal limits. Visualized scalp soft tissues are within normal limits.  IMPRESSION: 1. Acute brainstem infarct. Left paracentral pons lacune with no mass effect or hemorrhage. 2. 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 Chronic microhemorrhage associated with the former.   Electronically Signed   By: Odessa FlemingH  Hall M.D.   On: 01/23/2015  17:07   Medications:   . acetaminophen      . amLODipine  10 mg Oral Daily  . aspirin  300 mg Rectal Daily   Or  . aspirin  325 mg Oral Daily  . atorvastatin  80 mg Oral q1800  . calcitRIOL  0.25 mcg Oral Q M,W,F-HD  . cefTRIAXone (ROCEPHIN)  IV  1 g Intravenous Q24H  . fenofibrate  160 mg Oral Daily  . insulin aspart  0-5 Units Subcutaneous QHS  . insulin aspart  0-9 Units Subcutaneous TID WC  . levothyroxine  25 mcg Oral QAC breakfast  . lisinopril  40 mg Oral QHS  . sevelamer carbonate  800 mg Oral BID WC  . sodium chloride  3 mL Intravenous Q12H

## 2015-01-25 NOTE — Clinical Social Work Note (Signed)
CSW Consult Acknowledged:   CSW received a consult for medications assist. CSW will update the case manager regarding this consult. CSW will sign off.   Khaleah Duer, MSW, LCSWA [phone]

## 2015-01-25 NOTE — Progress Notes (Signed)
PT Cancellation Note  Patient Details Name: Natalie [name] MRN: [id number] DOB: February 04, 1971   Cancelled Treatment:    Reason Eval/Treat Not Completed: Patient at procedure or test/unavailable Pt off floor at HD. Will follow up next available time.  Alvie HeidelbergFolan, Lucelia Lacey A 01/25/2015, 8:53 AM  Alvie HeidelbergShauna Folan, PT, DPT [phone]

## 2015-01-26 DIAGNOSIS — E0821 Diabetes mellitus due to underlying condition with diabetic nephropathy: Secondary | ICD-10-CM

## 2015-01-26 DIAGNOSIS — I6350 Cerebral infarction due to unspecified occlusion or stenosis of unspecified cerebral artery: Secondary | ICD-10-CM

## 2015-01-26 LAB — GLUCOSE, CAPILLARY
GLUCOSE-CAPILLARY: 147 mg/dL — AB (ref 70–99)
GLUCOSE-CAPILLARY: 225 mg/dL — AB (ref 70–99)
Glucose-Capillary: 251 mg/dL — ABNORMAL HIGH (ref 70–99)
Glucose-Capillary: 180 mg/dL — ABNORMAL HIGH (ref 70–99)

## 2015-01-26 LAB — URINE CULTURE: Colony Count: 9000

## 2015-01-26 LAB — CBC
HEMATOCRIT: 36.6 % (ref 36.0–46.0)
Hemoglobin: 11.9 g/dL — ABNORMAL LOW (ref 12.0–15.0)
MCH: 31.6 pg (ref 26.0–34.0)
MCHC: 32.5 g/dL (ref 30.0–36.0)
MCV: 97.1 fL (ref 78.0–100.0)
PLATELETS: 424 10*3/uL — AB (ref 150–400)
RBC: 3.77 MIL/uL — ABNORMAL LOW (ref 3.87–5.11)
RDW: 16.4 % — ABNORMAL HIGH (ref 11.5–15.5)
WBC: 11.9 10*3/uL — ABNORMAL HIGH (ref 4.0–10.5)

## 2015-01-26 LAB — RENAL FUNCTION PANEL
ANION GAP: 12 (ref 5–15)
Albumin: 3.4 g/dL — ABNORMAL LOW (ref 3.5–5.2)
BUN: 33 mg/dL — ABNORMAL HIGH (ref 6–23)
CO2: 26 mmol/L (ref 19–32)
CREATININE: 6.25 mg/dL — AB (ref 0.50–1.10)
Calcium: 9.2 mg/dL (ref 8.4–10.5)
Chloride: 97 mmol/L (ref 96–112)
GFR calc Af Amer: 9 mL/min — ABNORMAL LOW (ref >90)
GFR, EST NON AFRICAN AMERICAN: 7 mL/min — AB (ref >90)
Glucose, Bld: 142 mg/dL — ABNORMAL HIGH (ref 70–99)
PHOSPHORUS: 5.7 mg/dL — AB (ref 2.3–4.6)
POTASSIUM: 5.3 mmol/L — AB (ref 3.5–5.1)
Sodium: 135 mmol/L (ref 135–145)

## 2015-01-26 MED ORDER — INSULIN GLARGINE 100 UNIT/ML ~~LOC~~ SOLN
5.0000 [IU] | Freq: Every day | SUBCUTANEOUS | Status: DC
  Administered 2015-01-26 – 2015-01-31 (×6): 5 [IU] via SUBCUTANEOUS
  Filled 2015-01-26 (×7): qty 0.05

## 2015-01-26 NOTE — Evaluation (Signed)
Occupational Therapy Evaluation Patient Details Name: Anysia Choi MRN: [id number] DOB: [birth year] Today's Date: 01/26/2015    History of Present Illness 44 yo female, with history of HTN, DM, and ESRD admitted for CVA, difficulty speaking, swallowing, numbness, and nausea on admit. MRI- + Acute brainstem infarct. Left paracentral pons lacune with no mass effect or hemorrhage.   Clinical Impression   Pt admitted with the above diagnoses and presents with below problem list. Pt will benefit from continued acute OT to address the below listed deficits and maximize independence with BADLs prior to d/c to venue below. PTA pt was independent with ADLs. Pt currently at min A for ADLs. Recommending CIR and feel pt would be a good candidate to increase independence with ADLs prior to returning home. If pt d/c home then will need HHOT and 24hr supervision/assistance.     Follow Up Recommendations  CIR; 24 hour Supervision/Assistance    Equipment Recommendations  Other (comment) (TBD)    Recommendations for Other Services       Precautions / Restrictions Precautions Precautions: Fall Restrictions Weight Bearing Restrictions: No      Mobility Bed Mobility               General bed mobility comments: in recliner  Transfers Overall transfer level: Needs assistance Equipment used: Rolling walker (2 wheeled) Transfers: Sit to/from Stand Sit to Stand: Min assist         General transfer comment: min A to control speed of rw for safety. Educated pt on technique for ambulating with rw.    Balance Overall balance assessment: Needs assistance         Standing balance support: Bilateral upper extremity supported;During functional activity Standing balance-Leahy Scale: Poor Standing balance comment: needs assist for balance                            ADL Overall ADL's : Needs assistance/impaired Eating/Feeding: Minimal assistance;Sitting   Grooming:  Minimal assistance;Sitting   Upper Body Bathing: Minimal assitance;Sitting   Lower Body Bathing: Minimal assistance;Sit to/from stand   Upper Body Dressing : Minimal assistance;Sitting   Lower Body Dressing: Minimal assistance;Sit to/from stand   Toilet Transfer: Minimal assistance;Ambulation;RW   Toileting- Clothing Manipulation and Hygiene: Minimal assistance;Sit to/from stand   Tub/ Shower Transfer: Minimal assistance;Ambulation;Rolling walker   Functional mobility during ADLs: Minimal assistance;Rolling walker General ADL Comments: min A for ADLs due to RUE/RLE weakness impacting ADLs. Pt completed in-room ambulation with cueing provided for technique with rw and min A for safety with balance. PT able to use RUE to comb a section of hair on her right side.     Vision Additional Comments: unable to further assess vision this session due to cognition, to be assessed further in functional context   Perception     Praxis      Pertinent Vitals/Pain Pain Assessment: No/denies pain     Hand Dominance Right   Extremity/Trunk Assessment Upper Extremity Assessment Upper Extremity Assessment: RUE deficits/detail RUE Deficits / Details: pt able to move RUE through [facility] AROM range with extra effort and time; 3-/5 at shoulder level, 3/5 at elbow and distal; decreased fine and gross motor coordination; stronger distal than proximal, some movements away from synergy pattern RUE Coordination: decreased fine motor;decreased gross motor   Lower Extremity Assessment Lower Extremity Assessment: Defer to PT evaluation       Communication Communication Communication: Prefers language other than [place]   Cognition  Arousal/Alertness: Awake/alert Behavior During Therapy: WFL for tasks assessed/performed Overall Cognitive Status: History of cognitive impairments - at baseline                     General Comments       Exercises Exercises: General Upper Extremity      Shoulder Instructions      Home Living Family/patient expects to be discharged to:: Private residence Living Arrangements: Other relatives Available Help at Discharge: Family Type of Home: Mobile home Home Access: Stairs to enter Entrance Stairs-Number of Steps: 8 Entrance Stairs-Rails: Right Home Layout: One level               Home Equipment: None   Additional Comments: pt lives with sister who works during the day  Lives With: Family;Other (Comment)    Prior Functioning/Environment Level of Independence: Independent        Comments: reports getting a ride to dialysis.    OT Diagnosis: Cognitive deficits;Disturbance of vision;Hemiplegia dominant side   OT Problem List: Decreased strength;Decreased activity tolerance;Impaired balance (sitting and/or standing);Impaired vision/perception;Decreased coordination;Decreased cognition;Decreased safety awareness;Decreased knowledge of use of DME or AE;Decreased knowledge of precautions;Impaired tone;Impaired UE functional use   OT Treatment/Interventions: Self-care/ADL training;Therapeutic exercise;Neuromuscular education;DME and/or AE instruction;Energy conservation;Therapeutic activities;Cognitive remediation/compensation;Visual/perceptual remediation/compensation;Balance training;Patient/family education    OT Goals(Current goals can be found in the care plan section) Acute Rehab OT Goals Patient Stated Goal: None stated OT Goal Formulation: With patient Time For Goal Achievement: 02/09/15 Potential to Achieve Goals: Good ADL Goals Pt Will Perform Eating: with modified independence;with adaptive utensils;sitting Pt Will Perform Grooming: with modified independence;with adaptive equipment;sitting;standing Pt Will Perform Upper Body Bathing: with modified independence;with adaptive equipment;sitting Pt Will Perform Lower Body Bathing: with modified independence;with adaptive equipment;sit to/from stand Pt Will Perform Upper  Body Dressing: with modified independence;with adaptive equipment;sitting Pt Will Perform Lower Body Dressing: with modified independence;with adaptive equipment;sit to/from stand Pt Will Transfer to Toilet: with modified independence;ambulating;regular height toilet;grab bars Pt Will Perform Toileting - Clothing Manipulation and hygiene: with modified independence;sit to/from stand;with adaptive equipment Pt/caregiver will Perform Home Exercise Program: Increased strength;Right Upper extremity;With Supervision;With written HEP provided  OT Frequency: Min 2X/week   Barriers to D/C: Decreased caregiver support  pt reports sister works during the day, unsure if she will have coverage during that time       Co-evaluation              End of Session Equipment Utilized During Treatment: Gait belt;Rolling walker  Activity Tolerance: Patient tolerated treatment well Patient left: in chair;with call bell/phone within reach;with chair alarm set;with family/visitor present   Time: [id number] OT Time Calculation (min): 33 min Charges:  OT General Charges $OT Visit: 1 Procedure OT Evaluation $Initial OT Evaluation Tier I: 1 Procedure OT Treatments $Self Care/Home Management : 8-22 mins G-Codes:    Pilar GrammesMathews, Diyana Starrett H 01/26/2015, 10:13 AM

## 2015-01-26 NOTE — Progress Notes (Signed)
  Echocardiogram 2D Echocardiogram has been performed.  Natalie [name], Natalie [name] A 01/26/2015, 12:15 PM

## 2015-01-26 NOTE — Progress Notes (Signed)
Inpatient Rehabilitation  I met with the patient and her niece [name] at the bedside to discuss her post acute rehab needs and options available to her.  Amedeo Plenty [phone]) will be returning to her home in [place] next week and will not be available to assist pt. a DC. Dawayne Cirri will discuss with her mother (pt's sister, whom patient lived with PTA).  [name] has an adult Down's Syndrome daughter she cares for.  I will follow up with this family tomorrow am .  I suspect family will not be able to provide needed assist for Mrs.Bufford Lope . I have discussed the case with Dysheeka Bibbs, SW.  Please call if questions.  [place] Admissions Coordinator Cell [phone] Office [phone]

## 2015-01-26 NOTE — Progress Notes (Signed)
Speech Language Pathology Treatment: Dysphagia  Patient Details Name: Natalie [name] MRN: [id number] DOB: [birth year] Today's Date: 01/26/2015 Time: [id number] SLP Time Calculation (min) (ACUTE ONLY): 30 min  Assessment / Plan / Recommendation Clinical Impression  Follow-up for dysphagia. Pt was directly observed with thin and solid consistencies with no overt s/s of aspiration. Pt coughed x1 following large cup sip of thin liquids. No s/s of aspiration observed when bolus sized reduced to small cup and straw sips. Noted right sided pocketing with solid consistencies; pt required cueing to sweep right side of mouth. SLP practiced with pt taking small cup sips of thin liquids. Recommend pt initiate dys 3/ thin liquid diet; no straws; small rate during meals; sweep right cheek to check for pocketing; full supervision during meals to cue for compensatory strategies. Speech will follow-up with diet tolerance and dysarthria tx.    HPI HPI: Pt is a 44 y.o. female admitted with dysarthria, trouble swallowing, and right sided weakness. 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  Medical history significant for HTN, diabetes, end stage renal disease, and intellectual disability.   Pt with a history of low literacy, no education and is non [place] speaking.   Pertinent Vitals    SLP Plan  Continue with current plan of care    Recommendations Diet recommendations: Dysphagia 3 (mechanical soft);Thin liquid;Other(comment) Liquids provided via: Cup;No straw Medication Administration: Whole meds with puree Supervision: Full supervision/cueing for compensatory strategies Compensations: Slow rate;Small sips/bites;Check for pocketing (Right side) Postural Changes and/or Swallow Maneuvers: Upright 30-60 min after meal;Seated upright 90 degrees              General recommendations: Rehab consult Oral Care  Recommendations: Oral care BID Follow up Recommendations: Inpatient Rehab Plan: Continue with current plan of care    GO     Bermudez-Bosch, Delores Edelstein 01/26/2015, 2:52 PM

## 2015-01-26 NOTE — Progress Notes (Signed)
[facility] PROGRESS NOTE  Natalie [name] ZOX:[id number] DOB: August 13, 1971 DOA: 01/23/2015 PCP: No primary care provider on file.  Assessment/Plan: #1 acute brainstem infarct. Left paracentral troponins lacunar with no mass effect or hemorrhage Prior MRI head. Patient also noted to have chronic lacunar infarcts of the right pons and left basal ganglia/corona radiata. Patient with dysmetria and right-sided weakness with some dysphagia. Carotid Dopplers with no significant ICA stenosis from preliminary reading. 2-D echo is pending. Unable to calculate LDL secondary to triglycerides of 526. Hemoglobin A1c is 7.8. Continue statin and TriCor. Continue aspirin for secondary stroke prevention. PT/OT/ST. Neurology following and appreciate input and recommendations.  #2 hypertension Stable. Continue Norvasc. D/C lisinopril secondary to hyperkalemia.  #3 hypothyroidism TSH elevated at 18.051. Continue Synthroid 25 g daily. Will need outpatient follow-up with thyroid function studies repeated in 4-6 weeks.  #4 bacteria in urine Urine cultures with insignificant growth. Discontinue IV Rocephin.   #5 diabetes mellitus Hemoglobin A1c = 7.8. CBGs 147-251. Place on low-dose Lantus. Continue sliding scale insulin.  #6 end-stage renal disease on hemodialysis Tuesdays Thursdays Saturdays Patient for HD tomorrow and then back on a regular schedule on Saturday. Per nephrology.  #7 hyperkalemia Likely secondary to problem #6. On HD.  Will place on a renal diet. Per nephrology. D/C ACE inhibitor.   #8 Hyperlipidemia Cholesterol = 305, TG 526. Continue statin and tricor.  #9 leukocytosis Likely reactive leukocytosis. Urine cultures with insignificant growth. Chest x-ray negative for any acute infiltrates. WBC trending down. D/C IV Rocephin. Follow.  #10 prophylaxis SCDs for DVT prophylaxis.  Code Status: Full Family Communication: Updated patient via interpreter. Disposition Plan: CIR when  bed available.   Consultants:  Nephrology: Dr. Arlean Hopping 01/24/2015  Neurology: Dr. Amada Jupiter 01/23/2015  Procedures:  CT head 01/23/2015  Chest x-ray 01/23/2015  MRI head 01/23/2015  Carotid dopplers 01/24/15  2 d echo 01/26/2015  Antibiotics:  Oral Cipro 01/23/15 >>>01/24/15  IV Rocephin 01/24/15>>>> 01/26/2015  HPI/Subjective: Patient still with RUE weakness.   Objective: Filed Vitals:   01/26/15 1400  BP: 149/68  Pulse: 64  Temp: 97.8 F (36.6 C)  Resp: 18    Intake/Output Summary (Last 24 hours) at 01/26/15 1435 Last data filed at 01/25/15 1500  Gross per 24 hour  Intake    240 ml  Output      0 ml  Net    240 ml   Filed Weights   01/23/15 2102 01/25/15 0810 01/25/15 1205  Weight: 60.555 kg (133 lb 8 oz) 62.5 kg (137 lb 12.6 oz) 60.6 kg (133 lb 9.6 oz)    Exam:   General:  NAD  Cardiovascular: RRR  Respiratory: CTAB anterior lung fields  Abdomen: Soft, nontender, nondistended, positive bowel sounds.  Musculoskeletal: No clubbing cyanosis or edema.  Data Reviewed: Basic Metabolic Panel:  Recent Labs Lab 01/23/15 1150 01/23/15 1159 01/24/15 0445 01/25/15 0830 01/26/15 0554  NA 134* 133* 135 134* 135  K 4.5 4.5 5.2* 5.9* 5.3*  CL 94* 99 100 101 97  CO2 22  --  23 17* 26  GLUCOSE 152* 159* 128* 160* 142*  BUN 43* 41* 52* 74* 33*  CREATININE 7.21* 7.30* 8.30* 9.73* 6.25*  CALCIUM 9.8  --  9.1 9.0 9.2  PHOS  --   --   --  6.1* 5.7*   Liver Function Tests:  Recent Labs Lab 01/23/15 1150 01/24/15 0445 01/25/15 0830 01/26/15 0554  AST 25 23  --   --   ALT 23 22  --   --  ALKPHOS 115 94  --   --   BILITOT 0.7 0.6  --   --   PROT 8.2 7.5  --   --   ALBUMIN 3.8 3.2* 3.1* 3.4*   No results for input(s): LIPASE, AMYLASE in the last 168 hours. No results for input(s): AMMONIA in the last 168 hours. CBC:  Recent Labs Lab 01/23/15 1150 01/23/15 1159 01/24/15 0445 01/25/15 0830 01/26/15 0554  WBC 13.0*  --  11.1* 10.9*  11.9*  NEUTROABS 8.9*  --   --   --   --   HGB 12.7 13.6 11.4* 11.1* 11.9*  HCT 37.6 40.0 34.8* 34.0* 36.6  MCV 95.4  --  96.1 95.2 97.1  PLT 464*  --  447* 477* 424*   Cardiac Enzymes:  Recent Labs Lab 01/23/15 1150  TROPONINI <0.03   BNP (last 3 results) No results for input(s): BNP in the last 8760 hours.  ProBNP (last 3 results) No results for input(s): PROBNP in the last 8760 hours.  CBG:  Recent Labs Lab 01/25/15 1639 01/25/15 1956 01/25/15 2143 01/26/15 0631 01/26/15 1140  GLUCAP 209* 250* 204* 147* 251*    Recent Results (from the past 240 hour(s))  Culture, Urine     Status: None   Collection Time: 01/25/15  3:37 AM  Result Value Ref Range Status   Specimen Description URINE, RANDOM  Final   Special Requests NONE  Final   Colony Count   Final    9,000 COLONIES/ML Performed at [facility]    Culture   Final    INSIGNIFICANT GROWTH Performed at [facility]    Report Status 01/26/2015 FINAL  Final     Studies: No results found.  Scheduled Meds: . amLODipine  10 mg Oral Daily  . atorvastatin  80 mg Oral q1800  . calcitRIOL  0.25 mcg Oral Q M,W,F-HD  . cefTRIAXone (ROCEPHIN)  IV  1 g Intravenous Q24H  . clopidogrel  75 mg Oral Daily  . fenofibrate  160 mg Oral Daily  . insulin aspart  0-5 Units Subcutaneous QHS  . insulin aspart  0-9 Units Subcutaneous TID WC  . levothyroxine  25 mcg Oral QAC breakfast  . lisinopril  40 mg Oral QHS  . sevelamer carbonate  800 mg Oral BID WC  . sodium chloride  3 mL Intravenous Q12H   Continuous Infusions:   Principal Problem:   Brainstem stroke Active Problems:   ESRD (end stage renal disease)   Diabetes   CVA (cerebral infarction)   Life long cognitive dysfunction   Hypothyroidism   Hyperlipidemia   Anemia   Hyperkalemia   Leukocytosis   UTI (urinary tract infection)   Weakness    Time spent: 40 minutes    Memorial Hermann Texas Medical CenterHOMPSON,DANIEL MD [facility] Pager [phone]. If 7PM-7AM,  please contact night-coverage at [email or web address], password [email or web address] 01/26/2015, 2:35 PM  LOS: 3 days

## 2015-01-26 NOTE — Consult Note (Signed)
Physical Medicine and Rehabilitation Consult Reason for Consult: Acute brainstem infarct. Left paracentral pons lacunar Referring Physician: [facility]   HPI: Natalie [name] is a 44 y.o. right handed Spanish speaking female with history of hypertension, diabetes mellitus and peripheral neuropathy, end-stage renal disease with hemodialysis as well as mild MR. By report patient lives with her sister. Presented 01/23/2015 with dysarthria and right side weakness. MRI of the brain showed acute brainstem infarct. 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 Carotid Dopplers with no ICA stenosis. Echocardiogram is pending. Patient did not receive TPA. Neurology consulted placed on Plavix for CVA prophylaxis. Aging continues on dialysis as directed. Presently on a mechanical soft nectar thick liquid diet. Physical therapy evaluation completed with recommendations of physical medicine rehabilitation consult.   Review of Systems  Unable to perform ROS: language   Past Medical History  Diagnosis Date  . Hypertension   . Diabetes mellitus without complication   . Hyperlipidemia 01/24/2015  . ESRD on hemodialysis started 02/25/2013    [facility] -   . Anemia of chronic disease   . Secondary hyperparathyroidism    Past Surgical History  Procedure Laterality Date  . Insertion of dialysis catheter     History reviewed. No pertinent family history. Social History:  reports that she has never smoked. She does not have any smokeless tobacco history on file. She reports that she does not drink alcohol or use illicit drugs. Allergies: No Known Allergies Medications Prior to Admission  Medication Sig Dispense Refill  . amLODipine (NORVASC) 10 MG tablet Take 10 mg by mouth daily.    Marland Kitchen glipiZIDE (GLUCOTROL) 5 MG tablet Take 5 mg by mouth daily before breakfast.    . lisinopril (PRINIVIL,ZESTRIL) 40 MG  tablet Take 40 mg by mouth at bedtime.    . sevelamer carbonate (RENVELA) 800 MG tablet Take 800 mg by mouth 2 (two) times daily with a meal.      Home: Home Living Family/patient expects to be discharged to:: Private residence Living Arrangements: Other relatives Available Help at Discharge: Family Type of Home: House Home Layout: One level Home Equipment: None  Lives With: Family, Other (Comment) (Sister and Niece.)  Functional History: Prior Function Level of Independence: Independent Comments: reports getting a ride to dialysis. Functional Status:  Mobility: Bed Mobility Overal bed mobility: Needs Assistance Bed Mobility: Supine to Sit, Sit to Supine Supine to sit: Min assist Sit to supine: Min guard General bed mobility comments: Increased time to get to EOB.  Transfers Overall transfer level: Needs assistance Equipment used: 1 person hand held assist Transfers: Sit to/from Stand Sit to Stand: Min guard Stand pivot transfers: Min guard General transfer comment: Mildly unsteady in standing.  Ambulation/Gait Ambulation/Gait assistance: Min assist Ambulation Distance (Feet): 125 Feet Assistive device: None Gait Pattern/deviations: Step-through pattern, Decreased stride length, Decreased dorsiflexion - right, Staggering left, Staggering right, Drifts right/left Gait velocity interpretation: Below normal speed for age/gender General Gait Details: Pt with decreased foot clearance RLE with a few instances of tripping over foot when fatigued. Downward gaze during gait. Cues to slow down and for straight gaze. Very unsteady- needing constant Min A for support/safety.    ADL:    Cognition: Cognition Overall Cognitive Status: Difficult to assess Arousal/Alertness: Awake/alert Orientation Level: Oriented to person, Oriented to place, Disoriented to time, Disoriented to situation Attention: Focused, Sustained Focused Attention: Appears intact  Sustained Attention: Appears  intact Memory: Appears intact (Similar to baseline) Awareness: Impaired Awareness Impairment: Emergent impairment, Anticipatory impairment Executive Function: Self Monitoring, Sequencing Sequencing: Appears intact Self Monitoring: Impaired Self Monitoring Impairment: Verbal basic Behaviors: Impulsive Safety/Judgment: Impaired Cognition Arousal/Alertness: Awake/alert Behavior During Therapy: WFL for tasks assessed/performed Overall Cognitive Status: Difficult to assess Difficult to assess due to: Non-English speaking  Blood pressure 134/76, pulse 78, temperature 98.4 F (36.9 C), temperature source Oral, resp. rate 16, height 5\' 1"  (1.549 m), weight 60.6 kg (133 lb 9.6 oz), last menstrual period 12/23/2014, SpO2 94 %. Physical Exam  HENT:  Head: Normocephalic.  Eyes: EOM are normal.  Neck: Normal range of motion. Neck supple. No thyromegaly present.  Cardiovascular: Normal rate, regular rhythm and normal heart sounds.   Respiratory: Effort normal and breath sounds normal. No respiratory distress. She has no wheezes.  GI: Bowel sounds are normal. She exhibits no distension.  Musculoskeletal: She exhibits edema.  Neurological: She is alert.  She did follow simple demonstrated commands however exam limited by non-English speaking. Friend interprets---has fair insight. RUE grossly 3 to 3+/5. RLE 2+hf, 3- k3 and 3/5 ankle. LUE and LLE grossly 4 to 4+/5. Sensation grossly intact right arm/leg but seems to have slightly diminished LT.   Skin: Skin is warm and dry.  Psychiatric:  Limited cognitively to basic insight/awareness    Results for orders placed or performed during the hospital encounter of 01/23/15 (from the past 24 hour(s))  Glucose, capillary     Status: Abnormal   Collection Time: 01/25/15 12:51 PM  Result Value Ref Range   Glucose-Capillary 143 (H) 70 - 99 mg/dL  Glucose, capillary     Status: Abnormal   Collection Time: 01/25/15  4:39 PM  Result Value Ref Range    Glucose-Capillary 209 (H) 70 - 99 mg/dL  Glucose, capillary     Status: Abnormal   Collection Time: 01/25/15  7:56 PM  Result Value Ref Range   Glucose-Capillary 250 (H) 70 - 99 mg/dL   Comment 1 Notify RN    Comment 2 Document in Chart   Glucose, capillary     Status: Abnormal   Collection Time: 01/25/15  9:43 PM  Result Value Ref Range   Glucose-Capillary 204 (H) 70 - 99 mg/dL   Comment 1 Notify RN    Comment 2 Document in Chart   CBC     Status: Abnormal   Collection Time: 01/26/15  5:54 AM  Result Value Ref Range   WBC 11.9 (H) 4.0 - 10.5 K/uL   RBC 3.77 (L) 3.87 - 5.11 MIL/uL   Hemoglobin 11.9 (L) 12.0 - 15.0 g/dL   HCT 54.036.6 98.136.0 - 19.146.0 %   MCV 97.1 78.0 - 100.0 fL   MCH 31.6 26.0 - 34.0 pg   MCHC 32.5 30.0 - 36.0 g/dL   RDW 47.816.4 (H) 29.511.5 - 62.115.5 %   Platelets 424 (H) 150 - 400 K/uL  Renal function panel     Status: Abnormal   Collection Time: 01/26/15  5:54 AM  Result Value Ref Range   Sodium 135 135 - 145 mmol/L   Potassium 5.3 (H) 3.5 - 5.1 mmol/L   Chloride 97 96 - 112 mmol/L   CO2 26 19 - 32 mmol/L   Glucose, Bld 142 (H) 70 - 99 mg/dL   BUN 33 (H) 6 - 23 mg/dL   Creatinine, Ser 3.086.25 (H) 0.50 - 1.10 mg/dL   Calcium 9.2 8.4 - 65.710.5 mg/dL   Phosphorus 5.7 (  H) 2.3 - 4.6 mg/dL   Albumin 3.4 (L) 3.5 - 5.2 g/dL   GFR calc non Af Amer 7 (L) >90 mL/min   GFR calc Af Amer 9 (L) >90 mL/min   Anion gap 12 5 - 15  Glucose, capillary     Status: Abnormal   Collection Time: 01/26/15  6:31 AM  Result Value Ref Range   Glucose-Capillary 147 (H) 70 - 99 mg/dL   Comment 1 Notify RN    Comment 2 Document in Chart    No results found.  Assessment/Plan: Diagnosis: left paramedian pontine infarct 1. Does the need for close, 24 hr/day medical supervision in concert with the patient's rehab needs make it unreasonable for this patient to be served in a less intensive setting? Yes 2. Co-Morbidities requiring supervision/potential complications: dm, esrd, brain 3. Due to bladder  management, bowel management, safety, skin/wound care, disease management, medication administration, pain management and patient education, does the patient require 24 hr/day rehab nursing? Yes 4. Does the patient require coordinated care of a physician, rehab nurse, PT (1-2 hrs/day, 5 days/week), OT (1-2 hrs/day, 5 days/week) and SLP (1-2 hrs/day, 5 days/week) to address physical and functional deficits in the context of the above medical diagnosis(es)? Yes Addressing deficits in the following areas: balance, endurance, locomotion, strength, transferring, bowel/bladder control, bathing, dressing, feeding, grooming, toileting, cognition, swallowing and psychosocial support 5. Can the patient actively participate in an intensive therapy program of at least 3 hrs of therapy per day at least 5 days per week? Yes 6. The potential for patient to make measurable gains while on inpatient rehab is good 7. Anticipated functional outcomes upon discharge from inpatient rehab are supervision  with PT, supervision with OT, modified independent and supervision with SLP. 8. Estimated rehab length of stay to reach the above functional goals is: 7-10 days 9. Does the patient have adequate social supports and living environment to accommodate these discharge functional goals? Potentially 10. Anticipated D/C setting: Home 11. Anticipated post D/C treatments: HH therapy and Outpatient therapy 12. Overall Rehab/Functional Prognosis: excellent  RECOMMENDATIONS: This patient's condition is appropriate for continued rehabilitative care in the following setting: CIR Patient has agreed to participate in recommended program. Potentially Note that insurance prior authorization may be required for reimbursement for recommended care.  Comment: Need to establish social supports. Friends/family need to organize at least initial 24 supervision. Rehab Admissions Coordinator to follow up.  Thanks,  Ranelle Oyster, MD,  [place]     01/26/2015

## 2015-01-26 NOTE — Progress Notes (Signed)
[facility] Progress Note  Assessment/Plan: 1. Acute brainstem (pons) infarct with history of chronic lacunar infracts - work up per Neuro - so far Marland Kitchen^^ Chol,^^^ TG - started statin, PT worked with pt this am - said some balance issues but pt said that wasn't new - he felt she needed ongoing PT. Considering inpatient rehab? 2. ESRD - TTS - had HD Thursday and Monday -  - plan next HD Wed (tomorrow) off schedule- will then try to do Sat after that to get back on schedule 3. Hypertension/volume - CXR NAD on admission - BP 120 - 150 - in usual range; she continues on norvasc/lisinopril - her usual outpt BP meds (not sure she was taking as OP) 4. Anemia - Hgb 11.9  - hold on dosing ESA for now/ no Fe 5. Metabolic bone disease - Continue calcitriol at current dose - if corrected Ca stays down, might ^ to 0.5- binders when eating. P well controlled 6. Nutrition - D3 diet - 7. DM - on glucotrol as an outpt - per primary - BS 160 - 250 on SSI 8. Hypothyroidism - TSH -18 -synthroid started  Lister Brizzi A   01/26/2015,9:17 AM  LOS: 3 days        Subjective:   Denies pain- working with PT- saying that she has no strength   Objective Filed Vitals:   01/25/15 1735 01/25/15 2151 01/26/15 0116 01/26/15 0555  BP: 119/60 141/67 105/69 134/76  Pulse: 67 91 88 78  Temp: 98.4 F (36.9 C) 98.9 F (37.2 C) 98.6 F (37 C) 98.4 F (36.9 C)  TempSrc: Oral Oral Oral Oral  Resp: 20 18 16 16   Height:      Weight:      SpO2: 99% 96% 100% 94%   Physical Exam on HD goal 2.6  General: NAD Heart: RRR  Lungs: no rales Abdomen: soft ND + BS Extremities: no LE edema Dialysis Access: right upper AVF Qb 400  Dialysis Orders: [facility] 4 hr EDW 60.5 180 440/A 1.5 2K 2 Ca AVF no profile Mircera 50 q 4 weeks heparin 3500 calcitriol 0.25 - recently lowered from 0.75 due to ^ corr Ca iPTH 639 3/24 hgb 11.4 17%sat 3/24 - ferritin 1473 10/2014  Additional Objective Labs: Basic  Metabolic Panel:  Recent Labs Lab 01/24/15 0445 01/25/15 0830 01/26/15 0554  NA 135 134* 135  K 5.2* 5.9* 5.3*  CL 100 101 97  CO2 23 17* 26  GLUCOSE 128* 160* 142*  BUN 52* 74* 33*  CREATININE 8.30* 9.73* 6.25*  CALCIUM 9.1 9.0 9.2  PHOS  --  6.1* 5.7*   Liver Function Tests:  Recent Labs Lab 01/23/15 1150 01/24/15 0445 01/25/15 0830 01/26/15 0554  AST 25 23  --   --   ALT 23 22  --   --   ALKPHOS 115 94  --   --   BILITOT 0.7 0.6  --   --   PROT 8.2 7.5  --   --   ALBUMIN 3.8 3.2* 3.1* 3.4*   CBC:  Recent Labs Lab 01/23/15 1150  01/24/15 0445 01/25/15 0830 01/26/15 0554  WBC 13.0*  --  11.1* 10.9* 11.9*  NEUTROABS 8.9*  --   --   --   --   HGB 12.7  < > 11.4* 11.1* 11.9*  HCT 37.6  < > 34.8* 34.0* 36.6  MCV 95.4  --  96.1 95.2 97.1  PLT 464*  --  447* 477* 424*  < > =  values in this interval not displayed.  Cardiac Enzymes:  Recent Labs Lab 01/23/15 1150  TROPONINI <0.03   CBG:  Recent Labs Lab 01/25/15 1251 01/25/15 1639 01/25/15 1956 01/25/15 2143 01/26/15 0631  GLUCAP 143* 209* 250* 204* 147*  Studies/Results: No results found. Medications:   . amLODipine  10 mg Oral Daily  . atorvastatin  80 mg Oral q1800  . calcitRIOL  0.25 mcg Oral Q M,W,F-HD  . cefTRIAXone (ROCEPHIN)  IV  1 g Intravenous Q24H  . clopidogrel  75 mg Oral Daily  . fenofibrate  160 mg Oral Daily  . insulin aspart  0-5 Units Subcutaneous QHS  . insulin aspart  0-9 Units Subcutaneous TID WC  . levothyroxine  25 mcg Oral QAC breakfast  . lisinopril  40 mg Oral QHS  . sevelamer carbonate  800 mg Oral BID WC  . sodium chloride  3 mL Intravenous Q12H

## 2015-01-27 ENCOUNTER — Inpatient Hospital Stay (HOSPITAL_COMMUNITY): Payer: Medicaid Other

## 2015-01-27 ENCOUNTER — Encounter (HOSPITAL_COMMUNITY): Payer: Self-pay | Admitting: Radiology

## 2015-01-27 LAB — COMPREHENSIVE METABOLIC PANEL
ALBUMIN: 3.6 g/dL (ref 3.5–5.2)
ALK PHOS: 90 U/L (ref 39–117)
ALT: 27 U/L (ref 0–35)
AST: 37 U/L (ref 0–37)
Anion gap: 16 — ABNORMAL HIGH (ref 5–15)
BUN: 31 mg/dL — AB (ref 6–23)
CALCIUM: 9.5 mg/dL (ref 8.4–10.5)
CO2: 23 mmol/L (ref 19–32)
CREATININE: 6.09 mg/dL — AB (ref 0.50–1.10)
Chloride: 95 mmol/L — ABNORMAL LOW (ref 96–112)
GFR, EST AFRICAN AMERICAN: 9 mL/min — AB (ref >90)
GFR, EST NON AFRICAN AMERICAN: 8 mL/min — AB (ref >90)
Glucose, Bld: 219 mg/dL — ABNORMAL HIGH (ref 70–99)
Potassium: 6.3 mmol/L (ref 3.5–5.1)
Sodium: 134 mmol/L — ABNORMAL LOW (ref 135–145)
TOTAL PROTEIN: 8.3 g/dL (ref 6.0–8.3)
Total Bilirubin: 0.6 mg/dL (ref 0.3–1.2)

## 2015-01-27 LAB — RENAL FUNCTION PANEL
ALBUMIN: 3.6 g/dL (ref 3.5–5.2)
ANION GAP: 15 (ref 5–15)
BUN: 52 mg/dL — ABNORMAL HIGH (ref 6–23)
CO2: 22 mmol/L (ref 19–32)
CREATININE: 8.19 mg/dL — AB (ref 0.50–1.10)
Calcium: 9.4 mg/dL (ref 8.4–10.5)
Chloride: 99 mmol/L (ref 96–112)
GFR calc Af Amer: 6 mL/min — ABNORMAL LOW (ref >90)
GFR calc non Af Amer: 5 mL/min — ABNORMAL LOW (ref >90)
GLUCOSE: 162 mg/dL — AB (ref 70–99)
Phosphorus: 6.9 mg/dL — ABNORMAL HIGH (ref 2.3–4.6)
Potassium: 6.2 mmol/L (ref 3.5–5.1)
SODIUM: 136 mmol/L (ref 135–145)

## 2015-01-27 LAB — GLUCOSE, CAPILLARY
GLUCOSE-CAPILLARY: 199 mg/dL — AB (ref 70–99)
GLUCOSE-CAPILLARY: 187 mg/dL — AB (ref 70–99)
Glucose-Capillary: 176 mg/dL — ABNORMAL HIGH (ref 70–99)
Glucose-Capillary: 197 mg/dL — ABNORMAL HIGH (ref 70–99)

## 2015-01-27 LAB — LIPASE, BLOOD: Lipase: 37 U/L (ref 11–59)

## 2015-01-27 LAB — CBC
HCT: 36.7 % (ref 36.0–46.0)
HEMOGLOBIN: 11.6 g/dL — AB (ref 12.0–15.0)
MCH: 31.1 pg (ref 26.0–34.0)
MCHC: 31.6 g/dL (ref 30.0–36.0)
MCV: 98.4 fL (ref 78.0–100.0)
Platelets: 463 10*3/uL — ABNORMAL HIGH (ref 150–400)
RBC: 3.73 MIL/uL — ABNORMAL LOW (ref 3.87–5.11)
RDW: 16.3 % — AB (ref 11.5–15.5)
WBC: 12.5 10*3/uL — ABNORMAL HIGH (ref 4.0–10.5)

## 2015-01-27 MED ORDER — SODIUM CHLORIDE 0.9 % IV SOLN
1.0000 g | Freq: Once | INTRAVENOUS | Status: AC
  Administered 2015-01-27: 1 g via INTRAVENOUS
  Filled 2015-01-27: qty 10

## 2015-01-27 MED ORDER — SUCROFERRIC OXYHYDROXIDE 500 MG PO CHEW
500.0000 mg | CHEWABLE_TABLET | Freq: Three times a day (TID) | ORAL | Status: DC
  Administered 2015-01-27 – 2015-02-01 (×13): 500 mg via ORAL
  Filled 2015-01-27 (×18): qty 1

## 2015-01-27 MED ORDER — PROMETHAZINE HCL 25 MG/ML IJ SOLN: 12.5000 mg | Freq: Four times a day (QID) | INTRAMUSCULAR | Status: DC | PRN

## 2015-01-27 MED ORDER — INSULIN ASPART 100 UNIT/ML ~~LOC~~ SOLN
10.0000 [IU] | Freq: Once | SUBCUTANEOUS | Status: AC
  Administered 2015-01-27: 10 [IU] via INTRAVENOUS

## 2015-01-27 MED ORDER — CLOPIDOGREL BISULFATE 75 MG PO TABS
75.0000 mg | ORAL_TABLET | Freq: Every day | ORAL | Status: DC
  Administered 2015-01-28 – 2015-02-01 (×5): 75 mg via ORAL
  Filled 2015-01-27 (×5): qty 1

## 2015-01-27 MED ORDER — DEXTROSE 50 % IV SOLN
1.0000 | Freq: Once | INTRAVENOUS | Status: AC
  Administered 2015-01-27: 50 mL via INTRAVENOUS
  Filled 2015-01-27: qty 50

## 2015-01-27 MED ORDER — SEVELAMER CARBONATE 800 MG PO TABS: 2400.0000 mg | ORAL_TABLET | Freq: Two times a day (BID) | ORAL | Status: DC

## 2015-01-27 MED ORDER — RENA-VITE PO TABS
1.0000 | ORAL_TABLET | Freq: Every day | ORAL | Status: DC
  Administered 2015-01-28 – 2015-01-31 (×5): 1 via ORAL
  Filled 2015-01-27 (×5): qty 1

## 2015-01-27 MED ORDER — MENTHOL 3 MG MT LOZG
1.0000 | LOZENGE | OROMUCOSAL | Status: DC | PRN
  Administered 2015-01-27 – 2015-01-31 (×2): 3 mg via ORAL
  Filled 2015-01-27 (×2): qty 9

## 2015-01-27 NOTE — Progress Notes (Signed)
[facility] Progress Note  Assessment/Plan: 1. Acute brainstem (pons) infarct with history of chronic lacunar infracts - work up per Neuro - so far Marland Kitchen^^ Chol,^^^ TG - started statin, PT worked with pt this am - said some balance issues but pt said that wasn't new - he felt she needed ongoing PT. Being evaluated for inpt rehab  2. ESRD - TTS - had HD Thursday and Monday - -HDtoday off schedule- will then try to do Sat after that to get back on schedule K today is 6.2 - use 1 K for one hour then 2 K - have asked RN to check OLC - very little change in K from Monday to Tues after HD though  Cr did come down some. She has not had any recent procedures on access, that I can tell. Last AF 2/18 in usual range. D3 diet could also be high in K even though it says no potatoes or OJ - repeat BMP in am 3. Hypertension/volume - CXR NAD on admission - BP 120 - 150 - in usual range; she continues on norvasc- goal today 3.5 (pre weight 62.5) 4. Anemia - Hgb 11.9 - hold on dosing ESA for now/ no Fe - stable 5. Metabolic bone disease - Continue calcitriol at current dose - P up will increase renvela to 3 AC - D 3 diet could be higher in P 6. Nutrition - D3 diet /thin liquids+ renavite 7. DM - on glucotrol as an outpt - per primary - BS 160 - 250 on SSI 8. Hypothyroidism - TSH -18 -synthroid started 9. Disposition - Evaluated by rehab - may have support problems after d/c - has not been accepted yet   Sheffield SliderMartha B Bergman, PA-C [facility] Beeper [phone] 01/27/2015,7:14 AM  LOS: 4 days    Patient seen and examined, agree with above note with above modifications. Seen on HD- no issues.  Hyperkalemia noted- will be treated today just may not allow us to wit til Saturday to get back on schedule- dispo seems to be the main issue at this time.   Annie SableKellie Altonio Schwertner, MD 01/27/2015     Subjective:   No pain  Objective Filed Vitals:   01/26/15 1800 01/26/15 2101 01/27/15 0138  01/27/15 0617  BP: 136/67 136/74 124/71 143/67  Pulse: 82 86 79 81  Temp: 98.3 F (36.8 C) 98.7 F (37.1 C) 98.2 F (36.8 C) 97.9 F (36.6 C)  TempSrc: Oral Oral Oral Oral  Resp: 18 20 20    Height:      Weight:      SpO2: 97% 95% 97% 100%   Physical Exam General: supine on HD resting Heart: RRR Lungs: no rales Abdomen: soft NT Extremities: SCDs no overt edema Dialysis Access: right upper AVF Qb 350 - have ^ to 400 VP 230  Dialysis Orders: [facility] 4 hr EDW 60.5 180 440/A 1.5 2K 2 Ca AVF no profile Mircera 50 q 4 weeks heparin 3500 calcitriol 0.25 - recently lowered from 0.75 due to ^ corr Ca iPTH 639 3/24 hgb 11.4 17%sat 3/24 - ferritin 1473 10/2014  Additional Objective Labs: Basic Metabolic Panel:  Recent Labs Lab 01/25/15 0830 01/26/15 0554 01/27/15 0539  NA 134* 135 136  K 5.9* 5.3* 6.2*  CL 101 97 99  CO2 17* 26 22  GLUCOSE 160* 142* 162*  BUN 74* 33* 52*  CREATININE 9.73* 6.25* 8.19*  CALCIUM 9.0 9.2 9.4  PHOS 6.1* 5.7* 6.9*   Liver Function Tests:  Recent Labs Lab 01/23/15 1150 01/24/15 0445 01/25/15 0830 01/26/15 0554 01/27/15 0539  AST 25 23  --   --   --   ALT 23 22  --   --   --   ALKPHOS 115 94  --   --   --   BILITOT 0.7 0.6  --   --   --   PROT 8.2 7.5  --   --   --   ALBUMIN 3.8 3.2* 3.1* 3.4* 3.6   No results for input(s): LIPASE, AMYLASE in the last 168 hours. CBC:  Recent Labs Lab 01/23/15 1150  01/24/15 0445 01/25/15 0830 01/26/15 0554 01/27/15 0539  WBC 13.0*  --  11.1* 10.9* 11.9* 12.5*  NEUTROABS 8.9*  --   --   --   --   --   HGB 12.7  < > 11.4* 11.1* 11.9* 11.6*  HCT 37.6  < > 34.8* 34.0* 36.6 36.7  MCV 95.4  --  96.1 95.2 97.1 98.4  PLT 464*  --  447* 477* 424* 463*  < > = values in this interval not displayed. Blood Culture    Component Value Date/Time   SDES URINE, RANDOM 01/25/2015 0337   SPECREQUEST NONE 01/25/2015 0337   CULT  01/25/2015 0337    INSIGNIFICANT GROWTH Performed at [facility]     REPTSTATUS 01/26/2015 FINAL 01/25/2015 0337    Cardiac Enzymes:  Recent Labs Lab 01/23/15 1150  TROPONINI <0.03   CBG:  Recent Labs Lab 01/25/15 2143 01/26/15 0631 01/26/15 1140 01/26/15 1640 01/26/15 2100  GLUCAP 204* 147* 251* 225* 180*  Medications:   . amLODipine  10 mg Oral Daily  . atorvastatin  80 mg Oral q1800  . calcitRIOL  0.25 mcg Oral Q M,W,F-HD  . clopidogrel  75 mg Oral Daily  . fenofibrate  160 mg Oral Daily  . insulin aspart  0-5 Units Subcutaneous QHS  . insulin aspart  0-9 Units Subcutaneous TID WC  . insulin glargine  5 Units Subcutaneous QHS  . levothyroxine  25 mcg Oral QAC breakfast  . sevelamer carbonate  800 mg Oral BID WC  . sodium chloride  3 mL Intravenous Q12H

## 2015-01-27 NOTE — Clinical Social Work Note (Addendum)
CSW met the pt, pt niece Natalie [name], and pt's sister Natalie [name] at the bedside. CSW used spanish interpreter [id number]) at [phone]. Natalie [name] declined SNF placement. Natalie [name] reported that she will take the pt home before placing her in a SNF. Natalie [name] acknowledged that 24 hours care will be arranged if the pt can not go to CIR. CSW will sign off.   [place], MSW, [place]

## 2015-01-27 NOTE — Progress Notes (Signed)
PT Cancellation Note  Patient Details Name: Natalie [name] MRN: [id number] DOB: [birth year]   Cancelled Treatment:    Reason Eval/Treat Not Completed: Patient at procedure or test/unavailable.  Patient in HD. Will follow up as schedule allows.    Fredrich BirksRobinette, Julia Elizabeth 01/27/2015, 10:09 AM

## 2015-01-27 NOTE — Progress Notes (Signed)
Pt clotted system - initially in venous line, HD restarted, ran for awhile, and now large clot in arterial line. Given this and ^ K, believe she needs fistulagram. Have discussed with Dr. Tedra CoupeGoldsborough  Marty Hyatt Capobianco, PA-C

## 2015-01-27 NOTE — Progress Notes (Signed)
Inpatient Rehabilitation  I have spent extended time this am with [name], pt's niece and Johnny BridgeMartha, pt's sister discussing IP Rehab, need for assist in the home post rehab and family's commitment to this.  Kathrine CordsMirna is fluent in [place] and interprets for [place].  Johnny BridgeMartha does not want pt. To go to SNF and they desire that pt. Come to IP Rehab.  Johnny BridgeMartha has committed to having assist in the home so patient can return to the home environment post CIR.  I discussed likely need for a ramped entrance for pt. as there are 4-5 steps to access the home .  Sister Martha's husband [place] coming to [place] this [place] and will begin to plan for a ramp per [name].    Note pt. has clotted the HD system and will need a fistulagram therefore not medically ready for rehab today.  Will follow up tomorrow and will consider admission tomorrow pending medical readiness and bed availability.  I disucssed with pt.'s RN Chrissie NoaWilliam and with Dysheeka Bibbs, SW.  Please call if questions.  Weldon PickingSusan Shanaia Sievers PT Inpatient Rehab Admissions Coordinator Cell [phone] Office [phone]

## 2015-01-27 NOTE — Consult Note (Signed)
Chief Complaint: Chief Complaint  Patient presents with  . Weakness  . Dysphagia  Rt arm dialysis graft slow to no function  Referring Physician(s): Dr Kathrene BongoGoldsborough  History of Present Illness: Natalie [name] is a 44 y.o. 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 Possible thrombolysis/thrombectomy with possible angioplasty/stent placement. If unsuccessful, will perform placement of tunneled hemodialysis catheter All prepared for 3/31 procedure   Past Medical History  Diagnosis Date  . Hypertension   . Diabetes mellitus without complication   . Hyperlipidemia 01/24/2015  . ESRD on hemodialysis started 02/25/2013    [facility] [place] -   . Anemia of chronic disease   . Secondary hyperparathyroidism     Past Surgical History  Procedure Laterality Date  . Insertion of dialysis catheter      Allergies: Review of patient's allergies indicates no known allergies.  Medications: Prior to Admission medications   Medication Sig Start Date End Date Taking? Authorizing Provider  amLODipine (NORVASC) 10 MG tablet Take 10 mg by mouth daily.   Yes Historical Provider, MD  glipiZIDE (GLUCOTROL) 5 MG tablet Take 5 mg by mouth daily before breakfast.   Yes Historical Provider, MD  lisinopril (PRINIVIL,ZESTRIL) 40 MG tablet Take 40 mg by mouth at bedtime.   Yes Historical Provider, MD  sevelamer carbonate (RENVELA) 800 MG tablet Take 800 mg by mouth 2 (two) times daily with a meal.   Yes Historical Provider, MD     History reviewed. No pertinent family history.  History   Social History  . Marital Status: Single    Spouse Name: N/A  .  Number of Children: N/A  . Years of Education: N/A   Social History Main Topics  . Smoking status: Never Smoker   . Smokeless tobacco: Not on file  . Alcohol Use: No  . Drug Use: No  . Sexual Activity: Not on file   Other Topics Concern  . None   Social History Narrative   Originally [place],  Non English speaking     Review of Systems: A 12 point ROS discussed and pertinent positives are indicated in the HPI above.  All other systems are negative.  Review of Systems  Constitutional: Positive for activity change, appetite change and fatigue. Negative for fever and unexpected weight change.  Respiratory: Negative for shortness of breath.   Cardiovascular: Negative for chest pain.  Gastrointestinal: Negative for abdominal pain.  Neurological: Positive for speech difficulty, weakness and numbness. Negative for tremors, seizures, syncope and facial asymmetry.  Psychiatric/Behavioral: Positive for confusion. Negative for behavioral problems and agitation.    Vital Signs: BP 102/75 mmHg  Pulse 92  Temp(Src) 98.3 F (36.8 C) (Oral)  Resp 18  Ht 5\' 1"  (1.549 m)  Wt 59.6 kg (131 lb 6.3 oz)  BMI 24.84 kg/m2  SpO2 100%  LMP 12/23/2014  Physical Exam  Constitutional: She appears well-nourished.  Cardiovascular: Normal rate and regular rhythm.   No murmur heard. Pulmonary/Chest: Effort normal and breath sounds normal. She has no wheezes.  Abdominal: Soft. Bowel sounds are normal. There is no tenderness.  Musculoskeletal: Normal range of motion.  Weak rt side Does move all 4s  Neurological: She is alert.  aphasic  Skin: Skin is warm and dry.  Psychiatric:  consented sister/guardian in room Using Spanish Interpreter Jolly Mango  Nursing note and vitals reviewed.   Mallampati Score:  MD Evaluation Airway: WNL Heart: WNL Abdomen: WNL Chest/ Lungs: WNL ASA  Classification: 3 Mallampati/Airway Score: Two  Imaging: Dg Chest 2 View  01/23/2015   CLINICAL  DATA:  Slurred speech and generalized weakness  EXAM: CHEST  2 VIEW  COMPARISON:  None.  FINDINGS: Lungs are clear. Heart is mildly enlarged with pulmonary vascularity within normal limits. No adenopathy. No bone lesions.  IMPRESSION: Mild cardiomegaly.  No edema or consolidation.   Electronically Signed   By: Bretta Bang III M.D.   On: 01/23/2015 13:14   Ct Head Wo Contrast  01/23/2015   CLINICAL DATA:  Generalized weakness, dysphagia  EXAM: CT HEAD WITHOUT CONTRAST  TECHNIQUE: Contiguous axial images were obtained from the base of the skull through the vertex without intravenous contrast.  COMPARISON:  None.  FINDINGS: No skull fracture is noted. Paranasal sinuses and mastoid air cells are unremarkable. No intracranial hemorrhage, mass effect or midline shift.  No acute infarction. No mass lesion is noted on this unenhanced scan. No hydrocephalus. The gray and white-matter differentiation is preserved.  IMPRESSION: No acute intracranial abnormality.   Electronically Signed   By: Natasha Mead M.D.   On: 01/23/2015 12:28   Mr Brain Wo Contrast  01/23/2015   CLINICAL DATA:  44 year old female with weakness, slurred speech, difficulty swallowing. Initial encounter.  EXAM: MRI HEAD WITHOUT CONTRAST  TECHNIQUE: Multiplanar, multiecho pulse sequences of the brain and surrounding structures were obtained without intravenous contrast.  COMPARISON:  Head CT without contrast 1206 hours today.  FINDINGS: Restricted diffusion in a patchy and linear distribution in the left paracentral pons (series 4, image 15 and series 8, image 16). Nearby small right paracentral chronic pontine lacunar infarct (series 5, image 10) with hemosiderin. No hemorrhage associated with the acute lacune. T2 and FLAIR hyperintensity with no significant mass effect.  No other restricted diffusion. Major intracranial vascular flow voids are within normal limits.  Left corona radiata and lentiform nuclei a chronic lacunar infarct(s). No  cortical encephalomalacia identified. No other chronic blood products identified. No midline shift, mass effect, evidence of mass lesion, ventriculomegaly, extra-axial collection or acute intracranial hemorrhage. Cervicomedullary junction and pituitary are within normal limits.  Normal bone marrow signal. Negative visible cervical spine. Visible internal auditory structures appear normal. Visualized paranasal sinuses and mastoids are clear. Visualized orbit soft tissues are within normal limits. Visualized scalp soft tissues are within normal limits.  IMPRESSION: 1. Acute brainstem infarct. Left paracentral pons lacune with no mass effect or hemorrhage. 2. 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 Chronic microhemorrhage associated with the former.   Electronically Signed   By: Odessa Fleming M.D.   On: 01/23/2015 17:07    Labs:  CBC:  Recent Labs  01/24/15 0445 01/25/15 0830 01/26/15 0554 01/27/15 0539  WBC 11.1* 10.9* 11.9* 12.5*  HGB 11.4* 11.1* 11.9* 11.6*  HCT 34.8* 34.0* 36.6 36.7  PLT 447* 477* 424* 463*    COAGS:  Recent Labs  01/23/15 1150  INR 1.03    BMP:  Recent Labs  01/24/15 0445 01/25/15 0830 01/26/15 0554 01/27/15 0539  NA 135 134* 135 136  K 5.2* 5.9* 5.3* 6.2*  CL 100 101 97 99  CO2 23 17* 26 22  GLUCOSE 128* 160* 142*  162*  BUN 52* 74* 33* 52*  CALCIUM 9.1 9.0 9.2 9.4  CREATININE 8.30* 9.73* 6.25* 8.19*  GFRNONAA 5* 4* 7* 5*  GFRAA 6* 5* 9* 6*    LIVER FUNCTION TESTS:  Recent Labs  01/23/15 1150 01/24/15 0445 01/25/15 0830 01/26/15 0554 01/27/15 0539  BILITOT 0.7 0.6  --   --   --   AST 25 23  --   --   --   ALT 23 22  --   --   --   ALKPHOS 115 94  --   --   --   PROT 8.2 7.5  --   --   --   ALBUMIN 3.8 3.2* 3.1* 3.4* 3.6    TUMOR MARKERS: No results for input(s): AFPTM, CEA, CA199, CHROMGRNA in the last 8760 hours.  Assessment and Plan:  Rt arm dialysis graft slow flow/possibly  clotted Scheduled for shuntogram; poss pta/stent. Possible thrombolysis/thrombectomy with poss pta/stent placement. Possible dialysis catheter placement if needed Risks and Benefits discussed with the patient's guardian including, but not limited to bleeding, infection, vascular injury, pulmonary embolism, need for tunneled HD catheter placement or even death. All of the patient's questions were answered, patient's guardian is agreeable to proceed. Consent signed and in chart.  Pt on Plavix since 3/29---has had 2 doses (3/29 and 3/30)---will hold 3/31  Thank you for this interesting consult.  I greatly enjoyed meeting Natalie [name] and look forward to participating in their care.  Signed: Jadrian Bulman A 01/27/2015, 3:49 PM   I spent a total of 40 Minutes  in face to face in clinical consultation, greater than 50% of which was counseling/coordinating care for Rt arm dialysis graft shuntogram; pta/stent; thrombolysis/thrombectomy; poss dialysis catheter placement

## 2015-01-27 NOTE — Progress Notes (Signed)
OT Cancellation Note  Patient Details Name: Natalie [name] MRN: [id number] DOB: [birth year]   Cancelled Treatment:    Reason Eval/Treat Not Completed: Medical issues which prohibited therapy. Per nursing note rapid response called around noon when pt, while on toilet, "went rigid and then became unresponsive and limp assisted pt. To the bed where she started to vomit, rapid and Dr. Blake DivineAkula was called, bloodsugar was 173, B/P was 96/54, 250 bolus of normal saline was ordered and hung, rapid and the doctor evaluated the patient and determined that it was a vagal response, pt. Is stable and resting comfortabley." OT to reattempt tomorrow as schedule permits. Pilar GrammesMathews, Jaxsyn Azam H 01/27/2015, 2:04 PM

## 2015-01-27 NOTE — Progress Notes (Signed)
[facility] PROGRESS NOTE  Natalie [name] JXB:[id number] DOB: 12-Jun-1971 DOA: 01/23/2015 PCP: No primary care provider on file. INTERIM SUMMARY:  44 year old LADY WITH h/o hypertension , DM, ESRD on HD, mild MR comes in for speech difficulties and nausea. She was found to have acute brain stem infarct. Therapy evals orderd and recommended CIR. Currently awaiting CIR recommendations. Patient seen with the interpretor  At bedside.   Assessment/Plan: #1 acute brainstem infarct. Left paracentral troponins lacunar with no mass effect or hemorrhage . Patient also noted to have chronic lacunar infarcts of the right pons and left basal ganglia/corona radiata. Patient with dysmetria and right-sided weakness with some dysphagia. Carotid Dopplers with no significant ICA stenosis from preliminary reading. 2-D echo reviewed showed LVEF OF 60 TO 65% with no regional wall abnormalities, doppler parameters are consistent with abnormal left ventricular relaxation. Unable to calculate LDL secondary to triglycerides of 526. Hemoglobin A1c is 7.8. Continue statin and TriCor. Aspirin changed to plavix for secondary stroke prevention. PT/OT/ST evals done and recommending inpatient rehabilitation. Neurology following and appreciate input and recommendations.  #2 hypotension: BP parameters running low, esp post HD. A bolus of 250 ml ordered and orthostatic vital signs ordered.   #3 hypothyroidism TSH elevated at 18.051. Continue Synthroid 25 g daily. Will need outpatient follow-up with thyroid function studies repeated in 4-6 weeks.  #4 bacteria in urine Urine cultures with insignificant growth. IV antibiotics are discontinued.   #5 diabetes mellitus Hemoglobin A1c = 7.8.  CBG (last 3)   Recent Labs  01/26/15 2100 01/27/15 1125 01/27/15 1204  GLUCAP 180* 199* 176*    On SSI and low dose lantus.   #6 end-stage renal disease on hemodialysis Tuesdays Thursdays Saturdays Patient for HD tomorrow  and then back on a regular schedule on Saturday. Per nephrology.  #7 hyperkalemia Likely secondary to problem #6. On HD.  Will place on a renal diet. Per nephrology. D/C ACE inhibitor.  Repeat BMP this afternoon.   #8 Hyperlipidemia Cholesterol = 305, TG 526. Continue statin and tricor.  #9 leukocytosis Likely reactive leukocytosis. Urine cultures with insignificant growth. Chest x-ray negative for any acute infiltrates. procalcitonin levels will be ordered.   #10 prophylaxis SCDs for DVT prophylaxis.   #11 nausea , vomiting and an episode of unresponsiveness: Probably from vaso vagal effect from orthostatic hypotension post HD. She was given a bolus of 250 ml of NS. And she became awake and alert and oriented a few min after the episode. The episode happened when she got up from the sitting position after her BM. She reports some throat pain and nausea.  Repeat labs , CXR ordered. zofran ordered.   Code Status: Full Family Communication: discussed with family at bedside.  Disposition Plan: CIR when bed available.   Consultants:  Nephrology: Dr. Arlean HoppingSchertz 01/24/2015  Neurology: Dr. Amada JupiterKirkpatrick 01/23/2015  Procedures:  CT head 01/23/2015  Chest x-ray 01/23/2015  MRI head 01/23/2015  Carotid dopplers 01/24/15  2 d echo 01/26/2015  Antibiotics:  Oral Cipro 01/23/15 >>>01/24/15  IV Rocephin 01/24/15>>>> 01/26/2015  HPI/Subjective: Nauseated and had vomiting.   Objective: Filed Vitals:   01/27/15 1034  BP: 98/58  Pulse: 70  Temp: 97.7 F (36.5 C)  Resp: 17    Intake/Output Summary (Last 24 hours) at 01/27/15 1214 Last data filed at 01/27/15 1034  Gross per 24 hour  Intake    480 ml  Output   1501 ml  Net  -1021 ml   Filed Weights   01/25/15 0810  01/25/15 1205 01/27/15 1034  Weight: 62.5 kg (137 lb 12.6 oz) 60.6 kg (133 lb 9.6 oz) 59.6 kg (131 lb 6.3 oz)    Exam:   General:  IN MILD distress from vomiting.   Cardiovascular: RRR  Respiratory: CTAB  anterior lung fields  Abdomen: Soft, nontender, nondistended, positive bowel sounds.  Musculoskeletal: No clubbing cyanosis or edema.  Data Reviewed: Basic Metabolic Panel:  Recent Labs Lab 01/23/15 1150 01/23/15 1159 01/24/15 0445 01/25/15 0830 01/26/15 0554 01/27/15 0539  NA 134* 133* 135 134* 135 136  K 4.5 4.5 5.2* 5.9* 5.3* 6.2*  CL 94* 99 100 101 97 99  CO2 22  --  23 17* 26 22  GLUCOSE 152* 159* 128* 160* 142* 162*  BUN 43* 41* 52* 74* 33* 52*  CREATININE 7.21* 7.30* 8.30* 9.73* 6.25* 8.19*  CALCIUM 9.8  --  9.1 9.0 9.2 9.4  PHOS  --   --   --  6.1* 5.7* 6.9*   Liver Function Tests:  Recent Labs Lab 01/23/15 1150 01/24/15 0445 01/25/15 0830 01/26/15 0554 01/27/15 0539  AST 25 23  --   --   --   ALT 23 22  --   --   --   ALKPHOS 115 94  --   --   --   BILITOT 0.7 0.6  --   --   --   PROT 8.2 7.5  --   --   --   ALBUMIN 3.8 3.2* 3.1* 3.4* 3.6   No results for input(s): LIPASE, AMYLASE in the last 168 hours. No results for input(s): AMMONIA in the last 168 hours. CBC:  Recent Labs Lab 01/23/15 1150 01/23/15 1159 01/24/15 0445 01/25/15 0830 01/26/15 0554 01/27/15 0539  WBC 13.0*  --  11.1* 10.9* 11.9* 12.5*  NEUTROABS 8.9*  --   --   --   --   --   HGB 12.7 13.6 11.4* 11.1* 11.9* 11.6*  HCT 37.6 40.0 34.8* 34.0* 36.6 36.7  MCV 95.4  --  96.1 95.2 97.1 98.4  PLT 464*  --  447* 477* 424* 463*   Cardiac Enzymes:  Recent Labs Lab 01/23/15 1150  TROPONINI <0.03   BNP (last 3 results) No results for input(s): BNP in the last 8760 hours.  ProBNP (last 3 results) No results for input(s): PROBNP in the last 8760 hours.  CBG:  Recent Labs Lab 01/26/15 0631 01/26/15 1140 01/26/15 1640 01/26/15 2100 01/27/15 1125  GLUCAP 147* 251* 225* 180* 199*    Recent Results (from the past 240 hour(s))  Culture, Urine     Status: None   Collection Time: 01/25/15  3:37 AM  Result Value Ref Range Status   Specimen Description URINE, RANDOM  Final    Special Requests NONE  Final   Colony Count   Final    9,000 COLONIES/ML Performed at [facility]    Culture   Final    INSIGNIFICANT GROWTH Performed at [facility]    Report Status 01/26/2015 FINAL  Final     Studies: No results found.  Scheduled Meds: . amLODipine  10 mg Oral Daily  . atorvastatin  80 mg Oral q1800  . calcitRIOL  0.25 mcg Oral Q M,W,F-HD  . clopidogrel  75 mg Oral Daily  . fenofibrate  160 mg Oral Daily  . insulin aspart  0-5 Units Subcutaneous QHS  . insulin aspart  0-9 Units Subcutaneous TID WC  . insulin glargine  5 Units Subcutaneous QHS  .  levothyroxine  25 mcg Oral QAC breakfast  . multivitamin  1 tablet Oral QHS  . sodium chloride  3 mL Intravenous Q12H  . sucroferric oxyhydroxide  500 mg Oral TID WC   Continuous Infusions:   Principal Problem:   Brainstem stroke Active Problems:   ESRD (end stage renal disease)   Diabetes   CVA (cerebral infarction)   Life long cognitive dysfunction   Hypothyroidism   Hyperlipidemia   Anemia   Hyperkalemia   Leukocytosis   UTI (urinary tract infection)   Weakness    Time spent: 40 minutes    Braedyn Riggle MD [facility] Pager [phone]. If 7PM-7AM, please contact night-coverage at [email or web address], password [facility] 01/27/2015, 12:14 PM  LOS: 4 days

## 2015-01-27 NOTE — Progress Notes (Signed)
Pt. Returned from dialysis at around 11:30 a.m., passed her morning meds, crushed in applesauce at around 12 noon, assisted pt. To the bathroom, pt. Voided and as she was about to get up she went rigid and then became unresponsive and limp, assisted pt. To the bed where she started to vomit, rapid and Dr. Blake DivineAkula was called, bloodsugar was 173, B/P was 96/54, 250 bolus of normal saline was ordered and hung, rapid and the doctor evaluated the patient and determined that it was a vagal response, pt. Is stable and resting comfortabley

## 2015-01-27 NOTE — Procedures (Signed)
Patient was seen on dialysis and the procedure was supervised.  BFR 400  Via AVF BP is  113/75.   Patient appears to be tolerating treatment well  Jahnessa Vanduyn A 01/27/2015

## 2015-01-27 NOTE — Progress Notes (Signed)
Called by charge RN to eval pt following an episode of vomiting, hypotension and diaphoresis after getting up from commode following HD.  On arrival pt awake and alert, CBG 176, no further vomiting. BP improved from 63/40 to  91/63.  Dr Blake DivineAkula to bedside, Zofran and 250 ml NS bolus ordered and given, pt cleaned and reassured, more comfortable and in no distress.  Family at bedside, updated and agreeable.

## 2015-01-27 NOTE — PMR Pre-admission (Signed)
PMR Admission Coordinator Pre-Admission Assessment  Patient: Natalie [name] is an 44 y.o., female MRN: [id number] DOB: 07-06-1971 Height:  (154.9 cm) Weight: 59 kg (130 lb 1.1 oz)              Insurance Information HMO:     PPO:      PCP:      IPA:      80/20:      OTHER:  PRIMARY: Self Pay      Policy#:       Subscriber:  CM Name:       Phone#:      Fax#:  Pre-Cert#:       Employer:  Benefits:  Phone #:      Name:  Eff. Date:      Deduct:       Out of Pocket Max:       Life Max:  CIR:       SNF:  Outpatient:      Co-Pay:  Home Health:       Co-Pay:  DME:      Co-Pay:  Providers:   Medicaid Application Date:       Case Manager:  Disability Application Date:       Case Worker:   Emergency Contact Information Contact Information    Name Relation Home Work Mobile   Benitez,[name] Nephew [phone]  [phone]   Cleophus Molt Niece [phone]       Current Medical History  Patient Admitting Diagnosis: left paramedian pontine infarct  History of Present Illness: Natalie [name] is a 44 y.o. RH-non english speaking female with history of hypertension,mild MR, ESRD-HD TTS who was admitted on 01/23/2015 with difficulty swallowing with drooling, dysarthria, R-sided weakness with increased difficulty walking. MRI of the brain showed acute brainstem infarct, left paracentral pons lacune with no mass effect as well as chronic lacunar infarcts of the right pons and left basal ganglia corona radiata. Carotid Dopplers without significant ICA stenosis. 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 She was noted to have leucocytosis likely due to UTI and was placed on Cipro for treatment. TSH elevated and synthroid added for supplement. Swallow evaluation done and patient placed on dysphagia 3 diet and has progressed to thin  liquids. Cognitive evaluation reveals patient at baseline. Dr. Roda Shutters  recommended ASA for left paracentral stroke due to small vessel disease.  Pt. Clotted in HD 01/27/15 and is to underwent a fistulagram 3/31 and IR completed thrombolysis, thrombectomy and balloon angioplasty. During emergent HD on 4/36for hyperkalemia, pt experienced hypotension and unresponsiveness. EEG to evaluate for seizures was negative. Pt tolerated hemodialysis 01/30/15 without difficulties.  Pt with cough and temo max 100.2, CXR shows bronchitic, no pneumonia or edema. Pt ordered levaquin for bronchitis.  Total: 10  NIH    Past Medical History  Past Medical History  Diagnosis Date  . Hypertension   . Diabetes mellitus without complication   . Hyperlipidemia 01/24/2015  . ESRD on hemodialysis started 02/25/2013    [facility] -   . Anemia of chronic disease   . Secondary hyperparathyroidism     Family History  family history is not on file.  Prior Rehab/Hospitalizations: none per Cleophus Molt, niece   Current Medications   Current facility-administered medications:  .  0.9 %  sodium chloride infusion, 250 mL, Intravenous, PRN, Pearson Grippe, MD .  acetaminophen (TYLENOL) tablet 650 mg, 650 mg, Oral, Q4H  PRN, Rodolph Bong, MD, 650 mg at 01/30/15 1325 .  albuterol (PROVENTIL) (2.5 MG/3ML) 0.083% nebulizer solution 2.5 mg, 2.5 mg, Nebulization, Q2H PRN, Kathlen Mody, MD .  atorvastatin (LIPITOR) tablet 80 mg, 80 mg, Oral, q1800, Pearson Grippe, MD, 80 mg at 01/31/15 1723 .  calcitRIOL (ROCALTROL) capsule 0.25 mcg, 0.25 mcg, Oral, Q M,W,F-HD, Weston Settle, PA-C, 0.25 mcg at 01/29/15 1215 .  calcium carbonate (dosed in mg elemental calcium) suspension 500 mg of elemental calcium, 500 mg of elemental calcium, Oral, Q6H PRN, Ramiro Harvest V, MD, 500 mg of elemental calcium at 01/27/15 1811 .  camphor-menthol (SARNA) lotion 1 application, 1 application, Topical, Q8H PRN **AND** hydrOXYzine (ATARAX/VISTARIL) tablet 25 mg, 25 mg, Oral, Q8H PRN, Ramiro Harvest V, MD .  clopidogrel (PLAVIX)  tablet 75 mg, 75 mg, Oral, Daily, Ralene Muskrat, PA-C, 75 mg at 02/01/15 1034 .  Darbepoetin Alfa (ARANESP) injection 60 mcg, 60 mcg, Intravenous, Q Sat-HD, Annie Sable, MD, 60 mcg at 01/30/15 0932 .  docusate sodium (ENEMEEZ) enema 283 mg, 1 enema, Rectal, PRN, Ramiro Harvest V, MD .  fenofibrate tablet 160 mg, 160 mg, Oral, Daily, Rodolph Bong, MD, 160 mg at 02/01/15 1034 .  guaifenesin (ROBITUSSIN) 100 MG/5ML syrup 200 mg, 200 mg, Oral, Q4H PRN, Kathlen Mody, MD, 200 mg at 01/31/15 2142 .  insulin aspart (novoLOG) injection 0-5 Units, 0-5 Units, Subcutaneous, QHS, Pearson Grippe, MD, 2 Units at 01/29/15 2215 .  insulin aspart (novoLOG) injection 0-9 Units, 0-9 Units, Subcutaneous, TID WC, Pearson Grippe, MD, 5 Units at 01/31/15 1240 .  insulin glargine (LANTUS) injection 5 Units, 5 Units, Subcutaneous, QHS, Rodolph Bong, MD, 5 Units at 01/31/15 2142 .  [START ON 02/02/2015] levofloxacin (LEVAQUIN) IVPB 500 mg, 500 mg, Intravenous, Q48H, Claudie Leach, RPH .  levothyroxine (SYNTHROID, LEVOTHROID) tablet 25 mcg, 25 mcg, Oral, QAC breakfast, Rodolph Bong, MD, 25 mcg at 02/01/15 0820 .  menthol-cetylpyridinium (CEPACOL) lozenge 3 mg, 1 lozenge, Oral, PRN, Kathlen Mody, MD, 3 mg at 01/31/15 1445 .  multivitamin (RENA-VIT) tablet 1 tablet, 1 tablet, Oral, QHS, Weston Settle, PA-C, 1 tablet at 01/31/15 2142 .  ondansetron (ZOFRAN) tablet 4 mg, 4 mg, Oral, Q6H PRN **OR** ondansetron (ZOFRAN) injection 4 mg, 4 mg, Intravenous, Q6H PRN, Rodolph Bong, MD, 4 mg at 01/30/15 1121 .  promethazine (PHENERGAN) injection 12.5 mg, 12.5 mg, Intravenous, Q6H PRN, Kathlen Mody, MD .  RESOURCE THICKENUP CLEAR, , Oral, PRN, Ramiro Harvest V, MD .  sodium chloride 0.9 % injection 3 mL, 3 mL, Intravenous, Q12H, Pearson Grippe, MD, 3 mL at 02/01/15 1035 .  sodium chloride 0.9 % injection 3 mL, 3 mL, Intravenous, PRN, Pearson Grippe, MD, 3 mL at 01/31/15 0954 .  sorbitol 70 % solution 30 mL, 30 mL, Oral, PRN,  Rodolph Bong, MD, 30 mL at 01/26/15 2330 .  sucroferric oxyhydroxide (VELPHORO) chewable tablet 500 mg, 500 mg, Oral, TID WC, Annie Sable, MD, 500 mg at 02/01/15 1610 .  zolpidem (AMBIEN) tablet 5 mg, 5 mg, Oral, QHS PRN, Rodolph Bong, MD  Patients Current Diet: Diet renal/carb modified with fluid restriction Diet-HS Snack?: Nothing; Room service appropriate?: Yes; Fluid consistency:: Thin; Fluid restriction:: 1200 mL Fluid; no potatoes, no orange juice per orders   Precautions / Restrictions Precautions Precautions: Fall Restrictions Weight Bearing Restrictions: No   Prior Activity Level Limited Community (1-2x/wk): Pt. is out of the home 3x/week  to her HD appointments on TuThSat. She is transported to  HD by a family friend.    Home Assistive Devices / Equipment Home Assistive Devices/Equipment: CBG Meter, Other (Comment) (home blood pressure cuff) Home Equipment: None  Prior Functional Level Prior Function Level of Independence: Independent Comments: reports getting a ride to dialysis.  Current Functional Level Cognition  Arousal/Alertness: Awake/alert Overall Cognitive Status: History of cognitive impairments - at baseline Difficult to assess due to: Non-English speaking Orientation Level: Oriented to person, Oriented to place, Oriented to time Attention: Focused, Sustained Focused Attention: Appears intact Sustained Attention: Appears intact Memory: Appears intact (Similar to baseline) Awareness: Impaired Awareness Impairment: Emergent impairment, Anticipatory impairment Executive Function: Self Monitoring, Sequencing Sequencing: Appears intact Self Monitoring: Impaired Self Monitoring Impairment: Verbal basic Behaviors: Impulsive Safety/Judgment: Impaired    Extremity Assessment (includes Sensation/Coordination)  Upper Extremity Assessment: RUE deficits/detail RUE Deficits / Details: pt able to move RUE through [facility] AROM range with extra effort and  time; 3-/5 at shoulder level, 3/5 at elbow and distal; decreased fine and gross motor coordination; stronger distal than proximal, some movements away from synergy pattern RUE Coordination: decreased fine motor, decreased gross motor  Lower Extremity Assessment: Defer to PT evaluation    ADLs  Overall ADL's : Needs assistance/impaired Eating/Feeding: Minimal assistance, Sitting Grooming: Minimal assistance, Sitting Upper Body Bathing: Minimal assitance, Sitting Lower Body Bathing: Minimal assistance, Sit to/from stand Upper Body Dressing : Minimal assistance, Sitting Lower Body Dressing: Minimal assistance, Sit to/from stand Toilet Transfer: Minimal assistance, Ambulation, RW Toileting- Clothing Manipulation and Hygiene: Minimal assistance, Sit to/from stand Tub/ Shower Transfer: Minimal assistance, Ambulation, Rolling walker Functional mobility during ADLs: Minimal assistance, Rolling walker General ADL Comments: Patient with baseline cognitive impairments and speaks spanish as her first language. Patient with ataxic movements during functional mobility. Complaints of stomach cramps. Patient transferred onto elevated toilet seat for toileting needs. Then stood at sink for grooming tasks of washing hands and combing hair. Educated patient on AAROM exercises > RUE to increase strength and functional use.     Mobility  Overal bed mobility: Needs Assistance Bed Mobility: Rolling, Sidelying to Sit Rolling: Min assist Sidelying to sit: Min assist, HOB elevated Supine to sit: Min assist Sit to supine: Min guard General bed mobility comments: Min A for control and elevation trunk to EOB. Patient's niece assisted with bed mobility    Transfers  Overall transfer level: Needs assistance Equipment used: 2 person hand held assist Transfers: Sit to/from Stand Sit to Stand: Min assist, +2 safety/equipment Stand pivot transfers: Min guard General transfer comment: Min A for stability and balance. A  to power up and translate weight anteriorly with stand.     Ambulation / Gait / Stairs / Wheelchair Mobility  Ambulation/Gait Ambulation/Gait assistance: Min assist, +2 physical assistance Ambulation Distance (Feet): 40 Feet Assistive device: 2 person hand held assist Gait Pattern/deviations: Drifts right/left, Staggering right, Staggering left, Scissoring, Ataxic, Shuffle Gait velocity interpretation: Below normal speed for age/gender General Gait Details: Patient ataxic and unstedy with gait this session. +2 min A for safety and stability. Decreased R foot clearance again this session. Limited by fatigue.     Posture / Balance Balance Overall balance assessment: Needs assistance Sitting-balance support: Feet supported, No upper extremity supported Sitting balance-Leahy Scale: Fair Standing balance support: No upper extremity supported, During functional activity Standing balance-Leahy Scale: Poor Standing balance comment: Patient requires Min A with functional standing balance. Cues for safety and positioning     Special needs/care consideration BiPAP/CPAP  no CPM  no Continuous Drip IV  no  Dialysis  yes        Days TuThSat Life Vest   no Oxygen no Special Bed   no Trach Size   no Wound Vac (area)   no       Skin    No current issues                               Bowel mgmt:   Last BM  01/29/15  Tends toward constipation per niece Bladder mgmt:  Continent , using BSC and toilet with assist Diabetic mgmt   Yes, A1c 7.8 Interpreter will be arranged by Admissions Coordinator for first inpt rehab day of evaluations for 4 hrs   Previous Home Environment Living Arrangements: Other relatives  Lives With: Family (pt. lives with her sister Johnny BridgeMartha and Chief Financial OfficerMartha's daughter) Available Help at Discharge: Family Type of Home: Mobile home Home Layout: One level Home Access: Stairs to enter Entrance Stairs-Rails: Right Entrance Stairs-Number of Steps:  (OT reports 8 steps, niece states  4-5) Bathroom Shower/Tub: Hydrographic surveyorTub/shower unit, Health visitorWalk-in shower Bathroom Toilet: Administratortandard Bathroom Accessibility: Yes How Accessible: Accessible via walker Home Care Services: No Additional Comments: pt lives with sister who works during the day  Discharge Living Setting Plans for Discharge Living Setting: Patient's home, Lives with (comment), Other (Comment) (sister Johnny BridgeMartha) Type of Home at Discharge: Mobile home Discharge Home Layout: One level Discharge Home Access: Stairs to enter Entrance Stairs-Rails: Left (different from OT note documenting R rail) Entrance Stairs-Number of Steps: 4-5 (per niece) Discharge Bathroom Shower/Tub: Tub/shower unit, Walk-in shower Discharge Bathroom Toilet: Standard Discharge Bathroom Accessibility: Yes How Accessible: Accessible via walker Does the patient have any problems obtaining your medications?: Yes (Describe) (prescription costs)  Social/Family/Support Systems Patient Roles: Other (Comment) (sister, aunt) Contact Information: Cleophus MoltMirna Benitez, niece, [phone], lives in [place] Anticipated Caregiver: pt's sister Tyrone SageMartha Rubio Benitez will be primary caregiver.  She plans to bring help into the home and hopes to have someone room with them in return for providing their room and board. Anticipated Caregiver's Contact Information: Tyrone SageMartha Rubio Benitez does not speak English, best contact is niece Cleophus MoltMirna Benitez at [phone] Ability/Limitations of Caregiver: Johnny BridgeMartha has an adult daughter with "Down's Syndrome" per niece 86[name].  [name] reports that Martha's daughter ([name]'s sister) require constant supervision and assist for all ADLs.  Johnny BridgeMartha is busy much of the time caring for her daughter.   Caregiver Availability: 24/7 Discharge Plan Discussed with Primary Caregiver: Yes Is Caregiver In Agreement with Plan?: Yes Does Caregiver/Family have Issues with Lodging/Transportation while Pt is in Rehab?: No  Goals/Additional Needs Patient/Family Goal for  Rehab: supervision for PT/OT; mod (I) to supervision for SLP Expected length of stay: 7-10 days Cultural Considerations: [place], only speaks [place].  Her sister Johnny BridgeMartha speaks [place] .  Niece Kathrine CordsMirna is fluent in [place], will be retruning to her home in [place] by April 7th. Equipment Needs: TBD, have notified  family that pt. will likely need ramp assess for post HD weakness superimposed on CVA Special Service Needs: TBD; pt. on HD TuThSat; may need SCAT transport if frined cannot continue to transport pt. to HD Pt/Family Agrees to Admission and willing to participate: Yes Program Orientation Provided & Reviewed with Pt/Caregiver Including Roles  & Responsibilities: Yes Additional Information Needs: Niece Cleophus MoltMirna Benitez is best contact as she speaks fluent English and is very involved in care at this point Information Needs to be Provided By:  Spanish interpreter    Decrease burden of Care through IP rehab admission: n/a  Possible need for SNF placement upon discharge:   Not anticipated, family verbalizes commitment to care for her in the home  Patient Condition: This patient's medical and functional status has changed since the consult dated: 01/26/15 in which the Rehabilitation Physician determined and documented that the patient's condition is appropriate for intensive rehabilitative care in an inpatient rehabilitation facility. See "History of Present Illness" (above) for medical update. Functional changes are: overall min to mod assist. Patient's medical and functional status update has been discussed with the Rehabilitation physician and patient remains appropriate for inpatient rehabilitation. Will admit to inpatient rehab today.  Preadmission Screen Completed By:  Clois Dupes, 02/01/2015 11:44 AM ______________________________________________________________________   Discussed status with Dr. Riley Kill on 02/01/2015 at  1141 and received telephone approval for  admission today.  Admission Coordinator:  Clois Dupes, time 1610 Date 02/01/2015.

## 2015-01-27 NOTE — Progress Notes (Signed)
SLP Cancellation Note  Treatment cancelled. Pt in HD.   GO     Bermudez-Bosch, Stellarose Cerny 01/27/2015, 10:04 AM

## 2015-01-27 NOTE — Progress Notes (Signed)
PT Cancellation Note  Patient Details Name: Natalie [name] MRN: [id number] DOB: January 24, 1971   Cancelled Treatment:    Reason Eval/Treat Not Completed: Medical issues which prohibited therapy. Patient with medical issues this afternoon once back to room. Will hold and follow up tomorrow.    Fredrich BirksRobinette, Lesslie Mossa Elizabeth 01/27/2015, 2:53 PM

## 2015-01-27 NOTE — Progress Notes (Signed)
Critical lab value Potassium 6.2. MD paged. Patient is in dialysis at this time. Chanc Kervin, Dayton ScrapeSarah E, RN

## 2015-01-28 ENCOUNTER — Inpatient Hospital Stay (HOSPITAL_COMMUNITY): Payer: Medicaid Other

## 2015-01-28 LAB — BASIC METABOLIC PANEL WITH GFR
Anion gap: 14 (ref 5–15)
BUN: 40 mg/dL — ABNORMAL HIGH (ref 6–23)
CO2: 24 mmol/L (ref 19–32)
Calcium: 9.7 mg/dL (ref 8.4–10.5)
Chloride: 97 mmol/L (ref 96–112)
Creatinine, Ser: 7.22 mg/dL — ABNORMAL HIGH (ref 0.50–1.10)
GFR calc Af Amer: 7 mL/min — ABNORMAL LOW
GFR calc non Af Amer: 6 mL/min — ABNORMAL LOW
Glucose, Bld: 143 mg/dL — ABNORMAL HIGH (ref 70–99)
Potassium: 6 mmol/L — ABNORMAL HIGH (ref 3.5–5.1)
Sodium: 135 mmol/L (ref 135–145)

## 2015-01-28 LAB — BASIC METABOLIC PANEL
Anion gap: 18 — ABNORMAL HIGH (ref 5–15)
BUN: 53 mg/dL — AB (ref 6–23)
CO2: 20 mmol/L (ref 19–32)
CREATININE: 8.51 mg/dL — AB (ref 0.50–1.10)
Calcium: 9.3 mg/dL (ref 8.4–10.5)
Chloride: 94 mmol/L — ABNORMAL LOW (ref 96–112)
GFR calc Af Amer: 6 mL/min — ABNORMAL LOW (ref >90)
GFR calc non Af Amer: 5 mL/min — ABNORMAL LOW (ref >90)
Glucose, Bld: 240 mg/dL — ABNORMAL HIGH (ref 70–99)
Potassium: >7.5 mmol/L (ref 3.5–5.1)
Sodium: 132 mmol/L — ABNORMAL LOW (ref 135–145)

## 2015-01-28 LAB — GLUCOSE, CAPILLARY
GLUCOSE-CAPILLARY: 123 mg/dL — AB (ref 70–99)
Glucose-Capillary: 154 mg/dL — ABNORMAL HIGH (ref 70–99)
Glucose-Capillary: 119 mg/dL — ABNORMAL HIGH (ref 70–99)
Glucose-Capillary: 194 mg/dL — ABNORMAL HIGH (ref 70–99)

## 2015-01-28 LAB — CBC
HCT: 32 % — ABNORMAL LOW (ref 36.0–46.0)
Hemoglobin: 10.3 g/dL — ABNORMAL LOW (ref 12.0–15.0)
MCH: 31.3 pg (ref 26.0–34.0)
MCHC: 32.2 g/dL (ref 30.0–36.0)
MCV: 97.3 fL (ref 78.0–100.0)
Platelets: 450 K/uL — ABNORMAL HIGH (ref 150–400)
RBC: 3.29 MIL/uL — ABNORMAL LOW (ref 3.87–5.11)
RDW: 16.5 % — ABNORMAL HIGH (ref 11.5–15.5)
WBC: 16.2 K/uL — ABNORMAL HIGH (ref 4.0–10.5)

## 2015-01-28 LAB — PROCALCITONIN: PROCALCITONIN: 0.67 ng/mL

## 2015-01-28 MED ORDER — MIDAZOLAM HCL 2 MG/2ML IJ SOLN
INTRAMUSCULAR | Status: AC | PRN
  Administered 2015-01-28 (×2): 1 mg via INTRAVENOUS

## 2015-01-28 MED ORDER — SODIUM POLYSTYRENE SULFONATE 15 GM/60ML PO SUSP
30.0000 g | Freq: Once | ORAL | Status: AC
  Administered 2015-01-28: 30 g via ORAL
  Filled 2015-01-28 (×2): qty 120

## 2015-01-28 MED ORDER — SODIUM POLYSTYRENE SULFONATE 15 GM/60ML PO SUSP
30.0000 g | Freq: Once | ORAL | Status: AC
  Administered 2015-01-28: 30 g via ORAL
  Filled 2015-01-28: qty 120

## 2015-01-28 MED ORDER — MIDAZOLAM HCL 2 MG/2ML IJ SOLN
INTRAMUSCULAR | Status: AC
  Filled 2015-01-28: qty 4

## 2015-01-28 MED ORDER — HEPARIN SODIUM (PORCINE) 1000 UNIT/ML IJ SOLN
INTRAMUSCULAR | Status: AC
  Administered 2015-01-28: 1000 [IU]
  Filled 2015-01-28: qty 1

## 2015-01-28 MED ORDER — ALTEPLASE 100 MG IV SOLR
4.0000 mg | INTRAVENOUS | Status: AC
  Administered 2015-01-28: 4 mg
  Filled 2015-01-28: qty 4

## 2015-01-28 MED ORDER — FENTANYL CITRATE 0.05 MG/ML IJ SOLN
INTRAMUSCULAR | Status: AC | PRN
  Administered 2015-01-28 (×3): 50 ug via INTRAVENOUS

## 2015-01-28 MED ORDER — FENTANYL CITRATE 0.05 MG/ML IJ SOLN
INTRAMUSCULAR | Status: AC
  Filled 2015-01-28: qty 4

## 2015-01-28 MED ORDER — IOHEXOL 300 MG/ML  SOLN
100.0000 mL | Freq: Once | INTRAMUSCULAR | Status: AC | PRN
  Administered 2015-01-28: 90 mL via INTRAVENOUS

## 2015-01-28 MED ORDER — LIDOCAINE HCL 1 % IJ SOLN
INTRAMUSCULAR | Status: AC
  Filled 2015-01-28: qty 20

## 2015-01-28 NOTE — Progress Notes (Signed)
PT Cancellation Note  Patient Details Name: [name] MRN: [id number] DOB: [birth year]   Cancelled Treatment:    Reason Eval/Treat Not Completed: Patient at procedure or test/unavailable. Patient going for fistula placement today in IR. Will follow up as able.    Fredrich BirksRobinette, Saylah Ketner Elizabeth 01/28/2015, 12:06 PM

## 2015-01-28 NOTE — Progress Notes (Signed)
OT Cancellation Note  Patient Details Name: Natalie [name] MRN: [id number] DOB: [birth year]   Cancelled Treatment:    Reason Eval/Treat Not Completed: Patient at procedure or test/ unavailable. Pt at IR for fistula placement. OT to reattempt as schedule permits  Pilar GrammesMathews, Tawny Raspberry H 01/28/2015, 1:52 PM

## 2015-01-28 NOTE — Sedation Documentation (Signed)
Patient denies pain and is resting comfortably.  

## 2015-01-28 NOTE — Progress Notes (Signed)
Received call from lab that Pt's potassium level is  >7.5. MD paged. Will continue to monitor. Gara KronerHayles, Fiza Nation M, RN

## 2015-01-28 NOTE — Procedures (Signed)
Post-Procedure Note  Pre-operative Diagnosis: ESRD with occluded right arm fistula       Post-operative Diagnosis: ESRD, re-established flow in right upper arm fistula.   Indications: Occluded fistula  Procedure Details:   Declot procedure of right upper arm cephalic vein with thrombolysis, thrombectomy and balloon angioplasty.    Findings: Entire right upper arm cephalic vein was occluded.  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  Blood loss:  Minimal  Complications: None     Condition: Stable  Plan: Ready to use for dialysis.  Remove sutures on 4/1.

## 2015-01-28 NOTE — Progress Notes (Signed)
Call IR to confirm time of procedure. Family notify of approximately 1200 Noon. Will continue to monitor.   Sim BoastHavy, RN

## 2015-01-28 NOTE — Progress Notes (Signed)
[facility] PROGRESS NOTE  Natalie [name] ZOX:[id number] DOB: 07/06/1971 DOA: 01/23/2015 PCP: No primary care provider on file. INTERIM SUMMARY:  44 year old LADY WITH h/o hypertension , DM, ESRD on HD, mild MR comes in for speech difficulties and nausea. She was found to have acute brain stem infarct. Therapy evals orderd and recommended CIR. Currently awaiting CIR recommendations. Patient seen with the interpretor at bedside.   Assessment/Plan: #1 acute brainstem infarct. Left paracentral troponins lacunar with no mass effect or hemorrhage Patient  noted to have chronic lacunar infarcts of the right pons and left basal ganglia/corona radiata.  Carotid Dopplers with no significant ICA stenosis from preliminary reading. 2-D echo reviewed showed LVEF OF 60 TO 65% with no regional wall abnormalities, doppler parameters are consistent with abnormal left ventricular relaxation. Unable to calculate LDL secondary to triglycerides of 526. Hemoglobin A1c is 7.8. Continue statin and TriCor. Aspirin changed to plavix for secondary stroke prevention. PT/OT/ST evals done and recommending inpatient rehabilitation. Neurology following and appreciate input and recommendations.  currently awaiting to hear from inpatient rehabilitation.   #2 hypotension: Resolved.   #3 hypothyroidism TSH elevated at 18.051. Continue Synthroid 25 g daily. Will need outpatient follow-up with thyroid function studies repeated in 4-6 weeks.  #4 bacteria in urine Urine cultures with insignificant growth. IV antibiotics are discontinued.   #5 diabetes mellitus Hemoglobin A1c = 7.8.  CBG (last 3)   Recent Labs  01/28/15 0705 01/28/15 1141 01/28/15 1638  GLUCAP 154* 119* 123*    On SSI and low dose lantus.   #6 end-stage renal disease on hemodialysis Tuesdays Thursdays Saturdays Patient for HD tomorrow and then back on a regular schedule on Saturday. Per nephrology.  #7 hyperkalemia Likely secondary to  problem #6. On HD.  Will place on a renal diet. Per nephrology. D/C ACE inhibitor.  One dose of calcium gluconate given overnight and 2 doses of kayexalate ordered but could nto be given as she was NPo for the procedure. One dose given at 4 and the next dose scheduled to be given at 10 pm.    #8 Hyperlipidemia Cholesterol = 305, TG 526. Continue statin and tricor.  #9 leukocytosis Likely reactive leukocytosis. Urine cultures with insignificant growth. Chest x-ray negative for any acute infiltrates. procalcitonin levels pending.   #10 prophylaxis SCDs for DVT prophylaxis.   #11 nausea , vomiting and an episode of unresponsiveness: Probably from vaso vagal effect from orthostatic hypotension post HD. She was given a bolus of 250 ml of NS. And she became awake and alert and oriented a few min after the episode. The episode happened when she got up from the sitting position after her BM. Liver panel normal. Lipase is normal. CXR negative for aspiration.   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 Reestablished flow in the right upper arm fistula.   Code Status: Full Family Communication: discussed with family at bedside.  Disposition Plan: CIR when bed available.   Consultants:  Nephrology: Dr. Arlean Hopping 01/24/2015  Neurology: Dr. Amada Jupiter 01/23/2015  Procedures:  CT head 01/23/2015  Chest x-ray 01/23/2015  MRI head 01/23/2015  Carotid dopplers 01/24/15  2 d echo 01/26/2015  Antibiotics:  Oral Cipro 01/23/15 >>>01/24/15  IV Rocephin 01/24/15>>>> 01/26/2015  HPI/Subjective: Feeling better. No new complaints.   Objective: Filed Vitals:   01/28/15 1544  BP: 148/70  Pulse: 65  Temp: 98.1 F (36.7 C)  Resp: 12  Intake/Output Summary (Last 24 hours) at 01/28/15 1801 Last data filed at 01/28/15 0800  Gross per 24 hour  Intake      0 ml  Output      0  ml  Net      0 ml   Filed Weights   01/25/15 0810 01/25/15 1205 01/27/15 1034  Weight: 62.5 kg (137 lb 12.6 oz) 60.6 kg (133 lb 9.6 oz) 59.6 kg (131 lb 6.3 oz)    Exam:   General:  Comfortable.   Cardiovascular: RRR  Respiratory: CTAB anterior lung fields no wheezing or rhonchi.   Abdomen: Soft, nontender, nondistended, positive bowel sounds.  Musculoskeletal: No clubbing cyanosis or edema.  Data Reviewed: Basic Metabolic Panel:  Recent Labs Lab 01/25/15 0830 01/26/15 0554 01/27/15 0539 01/27/15 1917 01/28/15 0638  NA 134* 135 136 134* 135  K 5.9* 5.3* 6.2* 6.3* 6.0*  CL 101 97 99 95* 97  CO2 17* 26 22 23 24   GLUCOSE 160* 142* 162* 219* 143*  BUN 74* 33* 52* 31* 40*  CREATININE 9.73* 6.25* 8.19* 6.09* 7.22*  CALCIUM 9.0 9.2 9.4 9.5 9.7  PHOS 6.1* 5.7* 6.9*  --   --    Liver Function Tests:  Recent Labs Lab 01/23/15 1150 01/24/15 0445 01/25/15 0830 01/26/15 0554 01/27/15 0539 01/27/15 1917  AST 25 23  --   --   --  37  ALT 23 22  --   --   --  27  ALKPHOS 115 94  --   --   --  90  BILITOT 0.7 0.6  --   --   --  0.6  PROT 8.2 7.5  --   --   --  8.3  ALBUMIN 3.8 3.2* 3.1* 3.4* 3.6 3.6    Recent Labs Lab 01/27/15 1917  LIPASE 37   No results for input(s): AMMONIA in the last 168 hours. CBC:  Recent Labs Lab 01/23/15 1150  01/24/15 0445 01/25/15 0830 01/26/15 0554 01/27/15 0539 01/28/15 0638  WBC 13.0*  --  11.1* 10.9* 11.9* 12.5* 16.2*  NEUTROABS 8.9*  --   --   --   --   --   --   HGB 12.7  < > 11.4* 11.1* 11.9* 11.6* 10.3*  HCT 37.6  < > 34.8* 34.0* 36.6 36.7 32.0*  MCV 95.4  --  96.1 95.2 97.1 98.4 97.3  PLT 464*  --  447* 477* 424* 463* 450*  < > = values in this interval not displayed. Cardiac Enzymes:  Recent Labs Lab 01/23/15 1150  TROPONINI <0.03   BNP (last 3 results) No results for input(s): BNP in the last 8760 hours.  ProBNP (last 3 results) No results for input(s): PROBNP in the last 8760 hours.  CBG:  Recent  Labs Lab 01/27/15 1704 01/27/15 2148 01/28/15 0705 01/28/15 1141 01/28/15 1638  GLUCAP 187* 197* 154* 119* 123*    Recent Results (from the past 240 hour(s))  Culture, Urine     Status: None   Collection Time: 01/25/15  3:37 AM  Result Value Ref Range Status   Specimen Description URINE, RANDOM  Final   Special Requests NONE  Final   Colony Count   Final    9,000 COLONIES/ML Performed at [facility]    Culture   Final    INSIGNIFICANT GROWTH Performed at [facility]    Report Status 01/26/2015 FINAL  Final     Studies: Dg Chest 1 View  01/27/2015  CLINICAL DATA:  Lethargy, possible aspiration  EXAM: CHEST  1 VIEW  COMPARISON:  01/23/2015  FINDINGS: Cardiac shadow is mildly enlarged. The lungs are clear bilaterally. No focal bony abnormality is noted.  IMPRESSION: No active disease.   Electronically Signed   By: Alcide Clever M.D.   On: 01/27/2015 16:33   Ct Head Wo Contrast  01/27/2015   CLINICAL DATA:  Generalized weakness. Diabetes mellitus. Syncopal episode during hemodialysis. Subsequent encounter.  EXAM: CT HEAD WITHOUT CONTRAST  TECHNIQUE: Contiguous axial images were obtained from the base of the skull through the vertex without intravenous contrast.  COMPARISON:  MR brain 01/23/2015 demonstrated an acute brainstem infarct.  FINDINGS: Aside from intense cytotoxic edema in the LEFT paramedian pontine infarct, no new acute infarction is demonstrated. There is a chronic deep white matter infarct in the subinsular region on the LEFT. Similar small chronic lacune RIGHT pons.  No hemorrhage, mass lesion, hydrocephalus, or extra-axial fluid.  Normal for age cerebral volume. No appreciable hypoattenuation white matter.  Calvarium intact.  No sinus or mastoid disease.  Compared with prior MR, when technique differences are considered, the overall distribution of the late acute to early subacute LEFT brainstem infarct appears similar.  IMPRESSION: Increasingly  well-demarcated LEFT paramedian pontine infarct, late acute to early subacute time course. No hemorrhagic transformation or new areas of infarction are identified.   Electronically Signed   By: Davonna Belling M.D.   On: 01/27/2015 16:28   Ir Pta Venous Right  01/28/2015   CLINICAL DATA:  44 year old with end-stage renal disease and right upper arm cephalic vein fistula. Poor flows in the right upper arm fistula. Patient was recently admitted for a brainstem non-hemorrhagic stroke  EXAM: DIALYSIS FISTULA DECLOT ; CEPHALIC VEIN ANGIOPLASTY; ULTRASOUND GUIDANCE FOR VASCULAR ACCESS  Physician: Rachelle Hora. Lowella Dandy, MD  FLUOROSCOPY TIME:  20 minutes and 30 seconds, 232 mGy  MEDICATIONS AND MEDICAL HISTORY: Heparin 3000 units, TPA 2 mg, Versed 2 mg, Fentanyl 150 mcg. A radiology nurse monitored the patient for moderate sedation.  ANESTHESIA/SEDATION: Moderate sedation time: 90 minutes  CONTRAST:  Ninety ml Omnipaque 300  PROCEDURE: Informed consent was obtained with a Nurse, learning disability. The right upper arm was evaluated with ultrasound. 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 The right upper arm was prepped and draped in a sterile fashion. Maximal barrier sterile technique was utilized including caps, mask, sterile gowns, sterile gloves, sterile drape, hand hygiene and skin antiseptic. The skin was anesthetized with 1% lidocaine. The fistula was accessed using 21 gauge needles towards the central venous system and arterial anastomosis with ultrasound guidance. Ultrasound images were obtained for documentation. Micropuncture catheters were placed. The vascular access pointing towards the central veins was upsized to a 6-French vascular sheath. A 5-French catheter was advanced into the central venous structures and a central venogram was performed. Heparin was given through this 5 [place] catheter. 2 mg of tPA was infused along the occluded cephalic vein as the catheter  was slowly pulled back. Fluoroscopic images were saved for documentation. A Bentson wire was placed centrally and the cephalic vein was treated with the AngioJet thrombectomy device. The access pointing towards the arterial anastomosis was upsized to a 6-French sheath. A wire was advanced into the arterial system. The arterial plug was pulled using a 5 [place] Fogarty balloon. The Fogarty balloon was pulled across the arterial anastomosis three additional times until there was some flow within the fistula.  At this point, there appeared to be an outflow obstruction. Pull-back venograms demonstrated stenosis in the central cephalic vein at the right shoulder. Central cephalic vein was treated multiple times with a 6 mm x 40 mm Conquest balloon. There was still very poor flow through the graft. As a result, the wire was removed and the aneurysmal segments were treated with the PTD thrombectomy device. After using the PTD thrombectomy device, there was markedly improved flow throughout the fistula. Venograms demonstrated residual clot and stenosis at the central cephalic vein. This area was treated again with the 6 mm balloon. Follow venograms demonstrated persistent poor flow in this area due to thrombus. This area was treated again with the Angiojet device. Following treatment with the Angiojet device, there was markedly improved flow through the central cephalic vein. 5 French catheter was advanced into the upper arm artery and the arterial anastomosis was evaluated with an arteriogram. The sheath pointing towards the anastomosis was removed with a pursestring suture. Follow up venogram demonstrated residual narrowing where the sheath was removed. This segment of the vein was treated with an 8 mm x 40 mm Conquest balloon and follow-up angiogram images were obtained. The remaining vascular sheath was removed with a pursestring.  Estimated blood loss: Minimal  FINDINGS: Ultrasound demonstrated an occluded right upper  arm cephalic vein. 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 Thrombus extends up to the right shoulder by ultrasound. Venogram also demonstrated thrombus throughout the right upper arm cephalic vein. In addition, there was a stenosis in the central cephalic vein. After the declot procedure, there was flow throughout the cephalic vein fistula. There was residual non-occlusive thrombus in the aneurysmal segments in the distal humeral region. Arterial anastomosis is patent. Central veins patent.  Complication:  None  IMPRESSION: Successful declot of the right upper arm fistula.  The right upper arm fistula remains amendable to percutaneous treatment.   Electronically Signed   By: Richarda Overlie M.D.   On: 01/28/2015 16:50   Ir Angio Av Shunt Addl Access  01/28/2015   CLINICAL DATA:  44 year old with end-stage renal disease and right upper arm cephalic vein fistula. Poor flows in the right upper arm fistula. Patient was recently admitted for a brainstem non-hemorrhagic stroke  EXAM: DIALYSIS FISTULA DECLOT ; CEPHALIC VEIN ANGIOPLASTY; ULTRASOUND GUIDANCE FOR VASCULAR ACCESS  Physician: Rachelle Hora. Lowella Dandy, MD  FLUOROSCOPY TIME:  20 minutes and 30 seconds, 232 mGy  MEDICATIONS AND MEDICAL HISTORY: Heparin 3000 units, TPA 2 mg, Versed 2 mg, Fentanyl 150 mcg. A radiology nurse monitored the patient for moderate sedation.  ANESTHESIA/SEDATION: Moderate sedation time: 90 minutes  CONTRAST:  Ninety ml Omnipaque 300  PROCEDURE: Informed consent was obtained with a Nurse, learning disability. The right upper arm was evaluated with ultrasound. 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 The right upper arm was prepped and draped in a sterile fashion. Maximal barrier sterile technique was utilized including caps, mask, sterile gowns, sterile gloves, sterile drape, hand hygiene and skin antiseptic. The skin was anesthetized with 1% lidocaine. The fistula  was accessed using 21 gauge needles towards the central venous system and arterial anastomosis with ultrasound guidance. Ultrasound images were obtained for documentation. Micropuncture catheters were placed. The vascular access pointing towards the central veins was upsized to a 6-French vascular sheath. A 5-French catheter was advanced into the central venous structures and a central venogram was performed. Heparin was given through  this 5 [place] catheter. 2 mg of tPA was infused along the occluded cephalic vein as the catheter was slowly pulled back. Fluoroscopic images were saved for documentation. A Bentson wire was placed centrally and the cephalic vein was treated with the AngioJet thrombectomy device. The access pointing towards the arterial anastomosis was upsized to a 6-French sheath. A wire was advanced into the arterial system. The arterial plug was pulled using a 5 [place] Fogarty balloon. The Fogarty balloon was pulled across the arterial anastomosis three additional times until there was some flow within the fistula.  At this point, there appeared to be an outflow obstruction. Pull-back venograms demonstrated stenosis in the central cephalic vein at the right shoulder. Central cephalic vein was treated multiple times with a 6 mm x 40 mm Conquest balloon. There was still very poor flow through the graft. As a result, the wire was removed and the aneurysmal segments were treated with the PTD thrombectomy device. After using the PTD thrombectomy device, there was markedly improved flow throughout the fistula. Venograms demonstrated residual clot and stenosis at the central cephalic vein. This area was treated again with the 6 mm balloon. Follow venograms demonstrated persistent poor flow in this area due to thrombus. This area was treated again with the Angiojet device. Following treatment with the Angiojet device, there was markedly improved flow through the central cephalic vein. 5 French catheter was  advanced into the upper arm artery and the arterial anastomosis was evaluated with an arteriogram. The sheath pointing towards the anastomosis was removed with a pursestring suture. Follow up venogram demonstrated residual narrowing where the sheath was removed. This segment of the vein was treated with an 8 mm x 40 mm Conquest balloon and follow-up angiogram images were obtained. The remaining vascular sheath was removed with a pursestring.  Estimated blood loss: Minimal  FINDINGS: Ultrasound demonstrated an occluded right upper arm cephalic vein. 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 Thrombus extends up to the right shoulder by ultrasound. Venogram also demonstrated thrombus throughout the right upper arm cephalic vein. In addition, there was a stenosis in the central cephalic vein. After the declot procedure, there was flow throughout the cephalic vein fistula. There was residual non-occlusive thrombus in the aneurysmal segments in the distal humeral region. Arterial anastomosis is patent. Central veins patent.  Complication:  None  IMPRESSION: Successful declot of the right upper arm fistula.  The right upper arm fistula remains amendable to percutaneous treatment.   Electronically Signed   By: Richarda Overlie M.D.   On: 01/28/2015 16:50   Ir US Guide Vasc Access Right  01/28/2015   CLINICAL DATA:  44 year old with end-stage renal disease and right upper arm cephalic vein fistula. Poor flows in the right upper arm fistula. Patient was recently admitted for a brainstem non-hemorrhagic stroke  EXAM: DIALYSIS FISTULA DECLOT ; CEPHALIC VEIN ANGIOPLASTY; ULTRASOUND GUIDANCE FOR VASCULAR ACCESS  Physician: Rachelle Hora. Lowella Dandy, MD  FLUOROSCOPY TIME:  20 minutes and 30 seconds, 232 mGy  MEDICATIONS AND MEDICAL HISTORY: Heparin 3000 units, TPA 2 mg, Versed 2 mg, Fentanyl 150 mcg. A radiology nurse monitored the patient for moderate sedation.  ANESTHESIA/SEDATION: Moderate sedation time: 90  minutes  CONTRAST:  Ninety ml Omnipaque 300  PROCEDURE: Informed consent was obtained with a Nurse, learning disability. The right upper arm was evaluated with ultrasound. 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 The right upper arm was  prepped and draped in a sterile fashion. Maximal barrier sterile technique was utilized including caps, mask, sterile gowns, sterile gloves, sterile drape, hand hygiene and skin antiseptic. The skin was anesthetized with 1% lidocaine. The fistula was accessed using 21 gauge needles towards the central venous system and arterial anastomosis with ultrasound guidance. Ultrasound images were obtained for documentation. Micropuncture catheters were placed. The vascular access pointing towards the central veins was upsized to a 6-French vascular sheath. A 5-French catheter was advanced into the central venous structures and a central venogram was performed. Heparin was given through this 5 [place] catheter. 2 mg of tPA was infused along the occluded cephalic vein as the catheter was slowly pulled back. Fluoroscopic images were saved for documentation. A Bentson wire was placed centrally and the cephalic vein was treated with the AngioJet thrombectomy device. The access pointing towards the arterial anastomosis was upsized to a 6-French sheath. A wire was advanced into the arterial system. The arterial plug was pulled using a 5 [place] Fogarty balloon. The Fogarty balloon was pulled across the arterial anastomosis three additional times until there was some flow within the fistula.  At this point, there appeared to be an outflow obstruction. Pull-back venograms demonstrated stenosis in the central cephalic vein at the right shoulder. Central cephalic vein was treated multiple times with a 6 mm x 40 mm Conquest balloon. There was still very poor flow through the graft. As a result, the wire was removed and the aneurysmal segments were treated  with the PTD thrombectomy device. After using the PTD thrombectomy device, there was markedly improved flow throughout the fistula. Venograms demonstrated residual clot and stenosis at the central cephalic vein. This area was treated again with the 6 mm balloon. Follow venograms demonstrated persistent poor flow in this area due to thrombus. This area was treated again with the Angiojet device. Following treatment with the Angiojet device, there was markedly improved flow through the central cephalic vein. 5 French catheter was advanced into the upper arm artery and the arterial anastomosis was evaluated with an arteriogram. The sheath pointing towards the anastomosis was removed with a pursestring suture. Follow up venogram demonstrated residual narrowing where the sheath was removed. This segment of the vein was treated with an 8 mm x 40 mm Conquest balloon and follow-up angiogram images were obtained. The remaining vascular sheath was removed with a pursestring.  Estimated blood loss: Minimal  FINDINGS: Ultrasound demonstrated an occluded right upper arm cephalic vein. 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 Thrombus extends up to the right shoulder by ultrasound. Venogram also demonstrated thrombus throughout the right upper arm cephalic vein. In addition, there was a stenosis in the central cephalic vein. After the declot procedure, there was flow throughout the cephalic vein fistula. There was residual non-occlusive thrombus in the aneurysmal segments in the distal humeral region. Arterial anastomosis is patent. Central veins patent.  Complication:  None  IMPRESSION: Successful declot of the right upper arm fistula.  The right upper arm fistula remains amendable to percutaneous treatment.   Electronically Signed   By: Richarda Overlie M.D.   On: 01/28/2015 16:50   Ir Declot Right Mod Sed  01/28/2015   CLINICAL DATA:  44 year old with end-stage renal disease and right upper  arm cephalic vein fistula. Poor flows in the right upper arm fistula. Patient was recently admitted for a brainstem non-hemorrhagic stroke  EXAM: DIALYSIS FISTULA DECLOT ; CEPHALIC VEIN ANGIOPLASTY; ULTRASOUND GUIDANCE FOR VASCULAR ACCESS  Physician: Rachelle Hora. Lowella Dandy, MD  FLUOROSCOPY TIME:  20 minutes and 30 seconds, 232 mGy  MEDICATIONS AND MEDICAL HISTORY: Heparin 3000 units, TPA 2 mg, Versed 2 mg, Fentanyl 150 mcg. A radiology nurse monitored the patient for moderate sedation.  ANESTHESIA/SEDATION: Moderate sedation time: 90 minutes  CONTRAST:  Ninety ml Omnipaque 300  PROCEDURE: Informed consent was obtained with a Nurse, learning disability. The right upper arm was evaluated with ultrasound. 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 The right upper arm was prepped and draped in a sterile fashion. Maximal barrier sterile technique was utilized including caps, mask, sterile gowns, sterile gloves, sterile drape, hand hygiene and skin antiseptic. The skin was anesthetized with 1% lidocaine. The fistula was accessed using 21 gauge needles towards the central venous system and arterial anastomosis with ultrasound guidance. Ultrasound images were obtained for documentation. Micropuncture catheters were placed. The vascular access pointing towards the central veins was upsized to a 6-French vascular sheath. A 5-French catheter was advanced into the central venous structures and a central venogram was performed. Heparin was given through this 5 [place] catheter. 2 mg of tPA was infused along the occluded cephalic vein as the catheter was slowly pulled back. Fluoroscopic images were saved for documentation. A Bentson wire was placed centrally and the cephalic vein was treated with the AngioJet thrombectomy device. The access pointing towards the arterial anastomosis was upsized to a 6-French sheath. A wire was advanced into the arterial system. The arterial plug was pulled  using a 5 [place] Fogarty balloon. The Fogarty balloon was pulled across the arterial anastomosis three additional times until there was some flow within the fistula.  At this point, there appeared to be an outflow obstruction. Pull-back venograms demonstrated stenosis in the central cephalic vein at the right shoulder. Central cephalic vein was treated multiple times with a 6 mm x 40 mm Conquest balloon. There was still very poor flow through the graft. As a result, the wire was removed and the aneurysmal segments were treated with the PTD thrombectomy device. After using the PTD thrombectomy device, there was markedly improved flow throughout the fistula. Venograms demonstrated residual clot and stenosis at the central cephalic vein. This area was treated again with the 6 mm balloon. Follow venograms demonstrated persistent poor flow in this area due to thrombus. This area was treated again with the Angiojet device. Following treatment with the Angiojet device, there was markedly improved flow through the central cephalic vein. 5 French catheter was advanced into the upper arm artery and the arterial anastomosis was evaluated with an arteriogram. The sheath pointing towards the anastomosis was removed with a pursestring suture. Follow up venogram demonstrated residual narrowing where the sheath was removed. This segment of the vein was treated with an 8 mm x 40 mm Conquest balloon and follow-up angiogram images were obtained. The remaining vascular sheath was removed with a pursestring.  Estimated blood loss: Minimal  FINDINGS: Ultrasound demonstrated an occluded right upper arm cephalic vein. 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 Thrombus extends up to the right shoulder by ultrasound. Venogram also demonstrated thrombus throughout the right upper arm cephalic vein. In addition, there was a stenosis in the central cephalic vein. After the declot procedure, there was flow  throughout the cephalic vein fistula. There was residual non-occlusive thrombus in the aneurysmal segments in the distal humeral region. Arterial anastomosis is patent. Central veins patent.  Complication:  None  IMPRESSION: Successful declot of the right upper arm fistula.  The right upper arm fistula remains amendable to percutaneous treatment.   Electronically Signed   By: Richarda OverlieAdam  Henn M.D.   On: 01/28/2015 16:50    Scheduled Meds: . atorvastatin  80 mg Oral q1800  . calcitRIOL  0.25 mcg Oral Q M,W,F-HD  . clopidogrel  75 mg Oral Daily  . fenofibrate  160 mg Oral Daily  . fentaNYL      . insulin aspart  0-5 Units Subcutaneous QHS  . insulin aspart  0-9 Units Subcutaneous TID WC  . insulin glargine  5 Units Subcutaneous QHS  . levothyroxine  25 mcg Oral QAC breakfast  . lidocaine      . midazolam      . multivitamin  1 tablet Oral QHS  . sodium chloride  3 mL Intravenous Q12H  . sodium polystyrene  30 g Oral Once  . sucroferric oxyhydroxide  500 mg Oral TID WC   Continuous Infusions:   Principal Problem:   Brainstem stroke Active Problems:   ESRD (end stage renal disease)   Diabetes   CVA (cerebral infarction)   Life long cognitive dysfunction   Hypothyroidism   Hyperlipidemia   Anemia   Hyperkalemia   Leukocytosis   UTI (urinary tract infection)   Weakness    Time spent: 30 minutes    Shawana Knoch MD [facility] Pager [phone]. If 7PM-7AM, please contact night-coverage at [email or web address], password [email or web address] 01/28/2015, 6:01 PM  LOS: 5 days

## 2015-01-28 NOTE — Progress Notes (Signed)
1600 Dose of Kayexalate retime to 2200 per Dr. Blake DivineAkula. AM dose given at 1544. Will continue to monitor.   Jimia Gentles, RN.

## 2015-01-28 NOTE — Progress Notes (Signed)
I will follow up tomorrow after procedure for dialysis access issues. Need to assess current functional level with next therapy session to determine most appropriate rehab venue. Inpt rehab vs Home with [facility] and 24/7 supervision.  [phone]

## 2015-01-28 NOTE — Progress Notes (Signed)
[facility] Progress Note  Assessment/Plan: 1. Acute brainstem (pons) infarct with history of chronic lacunar infracts - work up per Neuro - so far Marland Kitchen Chol,^^^ TG - started statin, PT worked with pt this am - said some balance issues but pt said that wasn't new - he felt she needed ongoing PT. Being evaluated for inpt rehab vs home with family- family refusing NHP 2. ESRD - TTS - had HD Thursday and Monday and Wednesday off schedule- now was thought to have an issue with her AVF- - numbers not much changed from yesterday- planning on fistulogram today, then would actually prefer to give her kayexalate later and plan on HD in AM- then will need to do again on Saturday to get back on schedule 3.  Hypertension/volume - CXR NAD on admission - BP 120 - 150 - in usual range; - volume is not an issue 4. Anemia - Hgb 11.9 - hold on dosing ESA for now/ no Fe - stable 5. Metabolic bone disease - Continue calcitriol at current dose - P up - have changed binder to velphoro due to size of pills  - D 3 diet could be higher in P 6. Nutrition - D3 diet /thin liquids+ renavite 7. DM - on glucotrol as an outpt - per primary - BS 160 - 250 on SSI 8. Hypothyroidism - TSH -18 -synthroid started 9. Disposition - Evaluated by rehab - may have support problems after d/c - has not been accepted yet   Elbert Spickler A   01/28/2015,9:46 AM  LOS: 5 days       Subjective:   No pain  Objective Filed Vitals:   01/27/15 2138 01/28/15 0152 01/28/15 0535 01/28/15 0940  BP: 110/63 111/62 115/75 110/54  Pulse: 95 93 83 79  Temp: 99.5 F (37.5 C) 99 F (37.2 C) 98.7 F (37.1 C) 98.6 F (37 C)  TempSrc: Oral Oral Oral Oral  Resp: Height:      Weight:      SpO2: 97% 95% 95% 95%   Physical Exam General: supine ,  resting Heart: RRR Lungs: no rales Abdomen: soft NT Extremities: SCDs no overt edema Dialysis Access: right upper AVF   Dialysis Orders: [facility] 4 hr EDW 60.5  180 440/A 1.5 2K 2 Ca AVF no profile Mircera 50 q 4 weeks heparin 3500 calcitriol 0.25 - recently lowered from 0.75 due to ^ corr Ca iPTH 639 3/24 hgb 11.4 17%sat 3/24 - ferritin 1473 10/2014  Additional Objective Labs: Basic Metabolic Panel:  Recent Labs Lab 01/25/15 0830 01/26/15 0554 01/27/15 0539 01/27/15 1917 01/28/15 0638  NA 134* 135 136 134* 135  K 5.9* 5.3* 6.2* 6.3* 6.0*  CL 101 97 99 95* 97  CO2 17* GLUCOSE 160* 142* 162* 219* 143*  BUN 74* 33* 52* 31* 40*  CREATININE 9.73* 6.25* 8.19* 6.09* 7.22*  CALCIUM 9.0 9.2 9.4 9.5 9.7  PHOS 6.1* 5.7* 6.9*  --   --    Liver Function Tests:  Recent Labs Lab 01/23/15 1150 01/24/15 0445  01/26/15 0554 01/27/15 0539 01/27/15 1917  AST 25 23  --   --   --  37  ALT 23 22  --   --   --  27  ALKPHOS 115 94  --   --   --  90  BILITOT 0.7 0.6  --   --   --  0.6  PROT 8.2 7.5  --   --   --  8.3  ALBUMIN 3.8 3.2*  < > 3.4* 3.6 3.6  < > = values in this interval not displayed.  Recent Labs Lab 01/27/15 1917  LIPASE 37   CBC:  Recent Labs Lab 01/23/15 1150  01/24/15 0445 01/25/15 0830 01/26/15 0554 01/27/15 0539 01/28/15 0638  WBC 13.0*  --  11.1* 10.9* 11.9* 12.5* 16.2*  NEUTROABS 8.9*  --   --   --   --   --   --   HGB 12.7  < > 11.4* 11.1* 11.9* 11.6* 10.3*  HCT 37.6  < > 34.8* 34.0* 36.6 36.7 32.0*  MCV 95.4  --  96.1 95.2 97.1 98.4 97.3  PLT 464*  --  447* 477* 424* 463* 450*  < > = values in this interval not displayed. Blood Culture    Component Value Date/Time   SDES URINE, RANDOM 01/25/2015 0337   SPECREQUEST NONE 01/25/2015 0337   CULT  01/25/2015 0337    INSIGNIFICANT GROWTH [facility]    REPTSTATUS 01/26/2015 FINAL 01/25/2015 0337    Cardiac Enzymes:  Recent Labs Lab 01/23/15 1150  TROPONINI <0.03   CBG:  Recent Labs Lab 01/27/15 1125 01/27/15 1204 01/27/15 1704 01/27/15 2148 01/28/15 0705  GLUCAP 199* 176* 187* 197* 154*  Medications:   .  atorvastatin  80 mg Oral q1800  . calcitRIOL  0.25 mcg Oral Q M,W,F-HD  . clopidogrel  75 mg Oral Daily  . fenofibrate  160 mg Oral Daily  . insulin aspart  0-5 Units Subcutaneous QHS  . insulin aspart  0-9 Units Subcutaneous TID WC  . insulin glargine  5 Units Subcutaneous QHS  . levothyroxine  25 mcg Oral QAC breakfast  . multivitamin  1 tablet Oral QHS  . sodium chloride  3 mL Intravenous Q12H  . sodium polystyrene  30 g Oral Once  . sucroferric oxyhydroxide  500 mg Oral TID WC

## 2015-01-28 NOTE — Progress Notes (Signed)
Call from RN, K>7.5, s/p TBX today.  Will do emergency dialysis. HD RN notified and orders written. Natalie [name] C

## 2015-01-28 NOTE — Progress Notes (Signed)
Patient arrived back to unit from IR. She appears in no distress at this time. TELE reapplied and confirmed. Food order per request. Will continue to monitor.  Sim BoastHavy, RN

## 2015-01-28 NOTE — Progress Notes (Signed)
SLP Cancellation Note  Patient Details Name: [name] MRN: [id number] DOB: [birth year]   Cancelled treatment:       Reason Eval/Treat Not Completed: Patient at procedure or test/unavailable. NPO for fistula placement.   Kasean Denherder, Riley NearingBonnie Caroline 01/28/2015, 7:50 AM

## 2015-01-28 NOTE — Progress Notes (Signed)
Patient is NPO for procedure this AM. Will administered medication once return back to room. Attending MD aware.  Sim BoastHavy, RN

## 2015-01-29 ENCOUNTER — Encounter (HOSPITAL_COMMUNITY): Payer: Self-pay | Admitting: Radiology

## 2015-01-29 ENCOUNTER — Inpatient Hospital Stay (HOSPITAL_COMMUNITY): Payer: Medicaid Other

## 2015-01-29 LAB — BASIC METABOLIC PANEL
ANION GAP: 18 — AB (ref 5–15)
BUN: 57 mg/dL — AB (ref 6–23)
CALCIUM: 9 mg/dL (ref 8.4–10.5)
CO2: 22 mmol/L (ref 19–32)
CREATININE: 9.05 mg/dL — AB (ref 0.50–1.10)
Chloride: 96 mmol/L (ref 96–112)
GFR calc Af Amer: 5 mL/min — ABNORMAL LOW (ref >90)
GFR, EST NON AFRICAN AMERICAN: 5 mL/min — AB (ref >90)
Glucose, Bld: 146 mg/dL — ABNORMAL HIGH (ref 70–99)
Potassium: 5 mmol/L (ref 3.5–5.1)
Sodium: 136 mmol/L (ref 135–145)

## 2015-01-29 LAB — GLUCOSE, CAPILLARY
Glucose-Capillary: 216 mg/dL — ABNORMAL HIGH (ref 70–99)
Glucose-Capillary: 254 mg/dL — ABNORMAL HIGH (ref 70–99)
Glucose-Capillary: 182 mg/dL — ABNORMAL HIGH (ref 70–99)
Glucose-Capillary: 202 mg/dL — ABNORMAL HIGH (ref 70–99)

## 2015-01-29 MED ORDER — ONDANSETRON HCL 4 MG/2ML IJ SOLN
INTRAMUSCULAR | Status: AC
  Filled 2015-01-29: qty 2

## 2015-01-29 MED ORDER — ALTEPLASE 2 MG IJ SOLR
2.0000 mg | Freq: Once | INTRAMUSCULAR | Status: DC | PRN
  Filled 2015-01-29: qty 2

## 2015-01-29 MED ORDER — LIDOCAINE-PRILOCAINE 2.5-2.5 % EX CREA
1.0000 "application " | TOPICAL_CREAM | CUTANEOUS | Status: DC | PRN
  Filled 2015-01-29: qty 5

## 2015-01-29 MED ORDER — HEPARIN SODIUM (PORCINE) 1000 UNIT/ML DIALYSIS
20.0000 [IU]/kg | INTRAMUSCULAR | Status: DC | PRN
  Filled 2015-01-29: qty 2

## 2015-01-29 MED ORDER — HEPARIN SODIUM (PORCINE) 1000 UNIT/ML DIALYSIS
1000.0000 [IU] | INTRAMUSCULAR | Status: DC | PRN
  Filled 2015-01-29: qty 1

## 2015-01-29 MED ORDER — PENTAFLUOROPROP-TETRAFLUOROETH EX AERO: 1.0000 "application " | INHALATION_SPRAY | CUTANEOUS | Status: DC | PRN

## 2015-01-29 MED ORDER — NEPRO/CARBSTEADY PO LIQD: 237.0000 mL | ORAL | Status: DC | PRN

## 2015-01-29 MED ORDER — LIDOCAINE HCL (PF) 1 % IJ SOLN: 5.0000 mL | INTRAMUSCULAR | Status: DC | PRN

## 2015-01-29 MED ORDER — SODIUM CHLORIDE 0.9 % IV SOLN: 100.0000 mL | INTRAVENOUS | Status: DC | PRN

## 2015-01-29 MED ORDER — DARBEPOETIN ALFA 60 MCG/0.3ML IJ SOSY
60.0000 ug | PREFILLED_SYRINGE | INTRAMUSCULAR | Status: DC
  Administered 2015-01-30: 60 ug via INTRAVENOUS
  Filled 2015-01-29: qty 0.3

## 2015-01-29 NOTE — Progress Notes (Signed)
[facility] PROGRESS NOTE  Natalie [name] ZOX:[id number] DOB: Jan 17, 1971 DOA: 01/23/2015 PCP: No primary care provider on file. INTERIM SUMMARY:  44 year old LADY WITH h/o hypertension , DM, ESRD on HD, mild MR comes in for speech difficulties and nausea. She was found to have acute brain stem infarct. Therapy evals orderd and recommended CIR. Currently awaiting CIR recommendations. Patient seen with the interpretor at bedside.   Assessment/Plan: #1 acute brainstem infarct. Left paracentral troponins lacunar with no mass effect or hemorrhage Patient  noted to have chronic lacunar infarcts of the right pons and left basal ganglia/corona radiata.  Carotid Dopplers with no significant ICA stenosis from preliminary reading. 2-D echo reviewed showed LVEF OF 60 TO 65% with no regional wall abnormalities, doppler parameters are consistent with abnormal left ventricular relaxation. Unable to calculate LDL secondary to triglycerides of 526. Hemoglobin A1c is 7.8. Continue statin and TriCor. Aspirin changed to plavix for secondary stroke prevention. PT/OT/ST evals done and recommending inpatient rehabilitation. Neurology following and appreciate input and recommendations.  currently awaiting to hear from inpatient rehabilitation.   #2 hypotension: Resolved.   #3 hypothyroidism TSH elevated at 18.051. Continue Synthroid 25 g daily. Will need outpatient follow-up with thyroid function studies repeated in 4-6 weeks.  #4 bacteria in urine Urine cultures with insignificant growth. IV antibiotics are discontinued.   #5 diabetes mellitus Hemoglobin A1c = 7.8.  CBG (last 3)   Recent Labs  01/29/15 0618 01/29/15 1143 01/29/15 1620  GLUCAP 216* 254* 182*    On SSI and low dose lantus.   #6 end-stage renal disease on hemodialysis Tuesdays Thursdays Saturdays Patient for HD tomorrow and then back on a regular schedule on Saturday. Per nephrology.  #7 hyperkalemia Likely secondary to  problem #6. On HD.  Her potassium is 7.5 tomorrow and she underwent emergent HD last night . Her repeat K IS 5 this am. Repeat bmp in am.   #8 Hyperlipidemia Cholesterol = 305, TG 526. Continue statin and tricor.  #9 leukocytosis Likely reactive leukocytosis. Urine cultures with insignificant growth. Chest x-ray negative for any acute infiltrates. procalcitonin levels pending.   #10 prophylaxis SCDs for DVT prophylaxis.   #11 nausea , vomiting and an episode of unresponsiveness: Probably from vaso vagal effect from orthostatic hypotension post HD. She was given a bolus of 250 ml of NS. And she became awake and alert and oriented a few min after the episode. The episode happened when she got up from the sitting position after her BM. Liver panel normal. Lipase is normal. CXR negative for aspiration. EEG ordered to evaluate for seizures and was negative .   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 Reestablished flow in the right upper arm fistula.    #13 persistent abdominal discomfort: CT abd and pelvis ordered and is negative for acute pathology.  Continue to monitor.  Her pain is much better after the BM.       Code Status: Full Family Communication: discussed with family at bedside.  Disposition Plan: CIR when bed available.   Consultants:  Nephrology: Dr. Arlean Hopping 01/24/2015  Neurology: Dr. Amada Jupiter 01/23/2015  Procedures:  CT head 01/23/2015  Chest x-ray 01/23/2015  MRI head 01/23/2015  Carotid dopplers 01/24/15  2 d echo 01/26/2015  Antibiotics:  Oral Cipro 01/23/15 >>>01/24/15  IV Rocephin 01/24/15>>>> 01/26/2015  HPI/Subjective: Feeling better. No new complaints.   Objective: Filed Vitals:   01/29/15 1742  BP: 127/63  Pulse: 86  Temp: 98.8 F (37.1 C)  Resp: 20    Intake/Output Summary (Last 24 hours) at 01/29/15 1857 Last data  filed at 01/29/15 1310  Gross per 24 hour  Intake    240 ml  Output      0 ml  Net    240 ml   Filed Weights   01/27/15 1034 01/29/15 0030 01/29/15 0426  Weight: 59.6 kg (131 lb 6.3 oz) 61.2 kg (134 lb 14.7 oz) 61.6 kg (135 lb 12.9 oz)    Exam:   General:  Comfortable.   Cardiovascular: RRR  Respiratory: CTAB anterior lung fields no wheezing or rhonchi.   Abdomen: Soft, nontender, nondistended, positive bowel sounds.  Musculoskeletal: No clubbing cyanosis or edema.  Data Reviewed: Basic Metabolic Panel:  Recent Labs Lab 01/25/15 0830 01/26/15 0554 01/27/15 0539 01/27/15 1917 01/28/15 0638 01/28/15 1920 01/29/15 0139  NA 134* 135 136 134* 135 132* 136  K 5.9* 5.3* 6.2* 6.3* 6.0* >7.5* 5.0  CL 101 97 99 95* 97 94* 96  CO2 17* 26 22 23 24 20 22   GLUCOSE 160* 142* 162* 219* 143* 240* 146*  BUN 74* 33* 52* 31* 40* 53* 57*  CREATININE 9.73* 6.25* 8.19* 6.09* 7.22* 8.51* 9.05*  CALCIUM 9.0 9.2 9.4 9.5 9.7 9.3 9.0  PHOS 6.1* 5.7* 6.9*  --   --   --   --    Liver Function Tests:  Recent Labs Lab 01/23/15 1150 01/24/15 0445 01/25/15 0830 01/26/15 0554 01/27/15 0539 01/27/15 1917  AST 25 23  --   --   --  37  ALT 23 22  --   --   --  27  ALKPHOS 115 94  --   --   --  90  BILITOT 0.7 0.6  --   --   --  0.6  PROT 8.2 7.5  --   --   --  8.3  ALBUMIN 3.8 3.2* 3.1* 3.4* 3.6 3.6    Recent Labs Lab 01/27/15 1917  LIPASE 37   No results for input(s): AMMONIA in the last 168 hours. CBC:  Recent Labs Lab 01/23/15 1150  01/24/15 0445 01/25/15 0830 01/26/15 0554 01/27/15 0539 01/28/15 0638  WBC 13.0*  --  11.1* 10.9* 11.9* 12.5* 16.2*  NEUTROABS 8.9*  --   --   --   --   --   --   HGB 12.7  < > 11.4* 11.1* 11.9* 11.6* 10.3*  HCT 37.6  < > 34.8* 34.0* 36.6 36.7 32.0*  MCV 95.4  --  96.1 95.2 97.1 98.4 97.3  PLT 464*  --  447* 477* 424* 463* 450*  < > = values in this interval not displayed. Cardiac Enzymes:  Recent Labs Lab 01/23/15 1150  TROPONINI  <0.03   BNP (last 3 results) No results for input(s): BNP in the last 8760 hours.  ProBNP (last 3 results) No results for input(s): PROBNP in the last 8760 hours.  CBG:  Recent Labs Lab 01/28/15 1638 01/28/15 2250 01/29/15 0618 01/29/15 1143 01/29/15 1620  GLUCAP 123* 194* 216* 254* 182*    Recent Results (from the past 240 hour(s))  Culture, Urine     Status: None   Collection Time: 01/25/15  3:37 AM  Result Value Ref Range Status   Specimen Description URINE, RANDOM  Final   Special Requests NONE  Final   Colony Count   Final    9,000 COLONIES/ML Performed at [facility]    Culture   Final    INSIGNIFICANT GROWTH Performed at [facility]    Report Status 01/26/2015 FINAL  Final     Studies: Ct Abdomen Pelvis Wo Contrast  01/29/2015   CLINICAL DATA:  Abdominal pain.  EXAM: CT ABDOMEN AND PELVIS WITHOUT CONTRAST  TECHNIQUE: Multidetector CT imaging of the abdomen and pelvis was performed following the standard protocol without IV contrast.  COMPARISON:  None.  FINDINGS: Minimal atelectasis is present in the lung bases. There is no pleural effusion.  Diffusely decreased attenuation of the liver is consistent with mild to moderate steatosis. The spleen is lobular in contour without focal abnormality identified. The gallbladder, adrenal glands, and pancreas have an unremarkable unenhanced appearance. There is marked bilateral renal atrophy.  Oral contrast is present in nondilated loops of small and large bowel to the level of the rectum without evidence of obstruction. The appendix is unremarkable. No gross bowel wall thickening or inflammation is identified.  Excreted contrast is present in the bladder, which is largely decompressed. The uterus and ovaries are identified. The uterus is anteverted. No pelvic mass is seen. Mild aortoiliac atherosclerotic calcification is noted. No free fluid or enlarged lymph nodes are identified. No acute osseous abnormality is  identified.  IMPRESSION: 1. No acute abnormality identified in the abdomen or pelvis. 2. Marked bilateral renal atrophy. 3. Hepatic steatosis.   Electronically Signed   By: Sebastian Ache   On: 01/29/2015 14:54   Ir Pta Venous Right  01/28/2015   CLINICAL DATA:  44 year old with end-stage renal disease and right upper arm cephalic vein fistula. Poor flows in the right upper arm fistula. Patient was recently admitted for a brainstem non-hemorrhagic stroke  EXAM: DIALYSIS FISTULA DECLOT ; CEPHALIC VEIN ANGIOPLASTY; ULTRASOUND GUIDANCE FOR VASCULAR ACCESS  Physician: Rachelle Hora. Lowella Dandy, MD  FLUOROSCOPY TIME:  20 minutes and 30 seconds, 232 mGy  MEDICATIONS AND MEDICAL HISTORY: Heparin 3000 units, TPA 2 mg, Versed 2 mg, Fentanyl 150 mcg. A radiology nurse monitored the patient for moderate sedation.  ANESTHESIA/SEDATION: Moderate sedation time: 90 minutes  CONTRAST:  Ninety ml Omnipaque 300  PROCEDURE: Informed consent was obtained with a Nurse, learning disability. The right upper arm was evaluated with ultrasound. 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 The right upper arm was prepped and draped in a sterile fashion. Maximal barrier sterile technique was utilized including caps, mask, sterile gowns, sterile gloves, sterile drape, hand hygiene and skin antiseptic. The skin was anesthetized with 1% lidocaine. The fistula was accessed using 21 gauge needles towards the central venous system and arterial anastomosis with ultrasound guidance. Ultrasound images were obtained for documentation. Micropuncture catheters were placed. The vascular access pointing towards the central veins was upsized to a 6-French vascular sheath. A 5-French catheter was advanced into the central venous structures and a central venogram was performed. Heparin was given through this 5 [place] catheter. 2 mg of tPA was infused along the occluded cephalic vein as the catheter was slowly pulled back.  Fluoroscopic images were saved for documentation. A Bentson wire was placed centrally and the cephalic vein was treated with the AngioJet thrombectomy device. The access pointing towards the arterial anastomosis was upsized to a 6-French sheath. A wire was advanced into the arterial system. The arterial plug was pulled using a 5 [place] Fogarty balloon. The Fogarty balloon was pulled across the arterial anastomosis three additional times until there was some flow within  the fistula.  At this point, there appeared to be an outflow obstruction. Pull-back venograms demonstrated stenosis in the central cephalic vein at the right shoulder. Central cephalic vein was treated multiple times with a 6 mm x 40 mm Conquest balloon. There was still very poor flow through the graft. As a result, the wire was removed and the aneurysmal segments were treated with the PTD thrombectomy device. After using the PTD thrombectomy device, there was markedly improved flow throughout the fistula. Venograms demonstrated residual clot and stenosis at the central cephalic vein. This area was treated again with the 6 mm balloon. Follow venograms demonstrated persistent poor flow in this area due to thrombus. This area was treated again with the Angiojet device. Following treatment with the Angiojet device, there was markedly improved flow through the central cephalic vein. 5 French catheter was advanced into the upper arm artery and the arterial anastomosis was evaluated with an arteriogram. The sheath pointing towards the anastomosis was removed with a pursestring suture. Follow up venogram demonstrated residual narrowing where the sheath was removed. This segment of the vein was treated with an 8 mm x 40 mm Conquest balloon and follow-up angiogram images were obtained. The remaining vascular sheath was removed with a pursestring.  Estimated blood loss: Minimal  FINDINGS: Ultrasound demonstrated an occluded right upper arm cephalic vein. 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 Thrombus extends up to the right shoulder by ultrasound. Venogram also demonstrated thrombus throughout the right upper arm cephalic vein. In addition, there was a stenosis in the central cephalic vein. After the declot procedure, there was flow throughout the cephalic vein fistula. There was residual non-occlusive thrombus in the aneurysmal segments in the distal humeral region. Arterial anastomosis is patent. Central veins patent.  Complication:  None  IMPRESSION: Successful declot of the right upper arm fistula.  The right upper arm fistula remains amendable to percutaneous treatment.   Electronically Signed   By: Richarda Overlie M.D.   On: 01/28/2015 16:50   Ir Angio Av Shunt Addl Access  01/28/2015   CLINICAL DATA:  45 year old with end-stage renal disease and right upper arm cephalic vein fistula. Poor flows in the right upper arm fistula. Patient was recently admitted for a brainstem non-hemorrhagic stroke  EXAM: DIALYSIS FISTULA DECLOT ; CEPHALIC VEIN ANGIOPLASTY; ULTRASOUND GUIDANCE FOR VASCULAR ACCESS  Physician: Rachelle Hora. Lowella Dandy, MD  FLUOROSCOPY TIME:  20 minutes and 30 seconds, 232 mGy  MEDICATIONS AND MEDICAL HISTORY: Heparin 3000 units, TPA 2 mg, Versed 2 mg, Fentanyl 150 mcg. A radiology nurse monitored the patient for moderate sedation.  ANESTHESIA/SEDATION: Moderate sedation time: 90 minutes  CONTRAST:  Ninety ml Omnipaque 300  PROCEDURE: Informed consent was obtained with a Nurse, learning disability. The right upper arm was evaluated with ultrasound. 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 The right upper arm was prepped and draped in a sterile fashion. Maximal barrier sterile technique was utilized including caps, mask, sterile gowns, sterile gloves, sterile drape, hand hygiene and skin antiseptic. The skin was anesthetized with 1% lidocaine. The fistula was accessed using 21  gauge needles towards the central venous system and arterial anastomosis with ultrasound guidance. Ultrasound images were obtained for documentation. Micropuncture catheters were placed. The vascular access pointing towards the central veins was upsized to a 6-French vascular sheath. A 5-French catheter was advanced into the central venous structures and a central venogram was performed. Heparin  was given through this 5 [place] catheter. 2 mg of tPA was infused along the occluded cephalic vein as the catheter was slowly pulled back. Fluoroscopic images were saved for documentation. A Bentson wire was placed centrally and the cephalic vein was treated with the AngioJet thrombectomy device. The access pointing towards the arterial anastomosis was upsized to a 6-French sheath. A wire was advanced into the arterial system. The arterial plug was pulled using a 5 [place] Fogarty balloon. The Fogarty balloon was pulled across the arterial anastomosis three additional times until there was some flow within the fistula.  At this point, there appeared to be an outflow obstruction. Pull-back venograms demonstrated stenosis in the central cephalic vein at the right shoulder. Central cephalic vein was treated multiple times with a 6 mm x 40 mm Conquest balloon. There was still very poor flow through the graft. As a result, the wire was removed and the aneurysmal segments were treated with the PTD thrombectomy device. After using the PTD thrombectomy device, there was markedly improved flow throughout the fistula. Venograms demonstrated residual clot and stenosis at the central cephalic vein. This area was treated again with the 6 mm balloon. Follow venograms demonstrated persistent poor flow in this area due to thrombus. This area was treated again with the Angiojet device. Following treatment with the Angiojet device, there was markedly improved flow through the central cephalic vein. 5 French catheter was advanced into the upper  arm artery and the arterial anastomosis was evaluated with an arteriogram. The sheath pointing towards the anastomosis was removed with a pursestring suture. Follow up venogram demonstrated residual narrowing where the sheath was removed. This segment of the vein was treated with an 8 mm x 40 mm Conquest balloon and follow-up angiogram images were obtained. The remaining vascular sheath was removed with a pursestring.  Estimated blood loss: Minimal  FINDINGS: Ultrasound demonstrated an occluded right upper arm cephalic vein. 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 Thrombus extends up to the right shoulder by ultrasound. Venogram also demonstrated thrombus throughout the right upper arm cephalic vein. In addition, there was a stenosis in the central cephalic vein. After the declot procedure, there was flow throughout the cephalic vein fistula. There was residual non-occlusive thrombus in the aneurysmal segments in the distal humeral region. Arterial anastomosis is patent. Central veins patent.  Complication:  None  IMPRESSION: Successful declot of the right upper arm fistula.  The right upper arm fistula remains amendable to percutaneous treatment.   Electronically Signed   By: Richarda Overlie M.D.   On: 01/28/2015 16:50   Ir US Guide Vasc Access Right  01/28/2015   CLINICAL DATA:  44 year old with end-stage renal disease and right upper arm cephalic vein fistula. Poor flows in the right upper arm fistula. Patient was recently admitted for a brainstem non-hemorrhagic stroke  EXAM: DIALYSIS FISTULA DECLOT ; CEPHALIC VEIN ANGIOPLASTY; ULTRASOUND GUIDANCE FOR VASCULAR ACCESS  Physician: Rachelle Hora. Lowella Dandy, MD  FLUOROSCOPY TIME:  20 minutes and 30 seconds, 232 mGy  MEDICATIONS AND MEDICAL HISTORY: Heparin 3000 units, TPA 2 mg, Versed 2 mg, Fentanyl 150 mcg. A radiology nurse monitored the patient for moderate sedation.  ANESTHESIA/SEDATION: Moderate sedation time: 90 minutes  CONTRAST:   Ninety ml Omnipaque 300  PROCEDURE: Informed consent was obtained with a Nurse, learning disability. The right upper arm was evaluated with ultrasound. 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 The right  upper arm was prepped and draped in a sterile fashion. Maximal barrier sterile technique was utilized including caps, mask, sterile gowns, sterile gloves, sterile drape, hand hygiene and skin antiseptic. The skin was anesthetized with 1% lidocaine. The fistula was accessed using 21 gauge needles towards the central venous system and arterial anastomosis with ultrasound guidance. Ultrasound images were obtained for documentation. Micropuncture catheters were placed. The vascular access pointing towards the central veins was upsized to a 6-French vascular sheath. A 5-French catheter was advanced into the central venous structures and a central venogram was performed. Heparin was given through this 5 [place] catheter. 2 mg of tPA was infused along the occluded cephalic vein as the catheter was slowly pulled back. Fluoroscopic images were saved for documentation. A Bentson wire was placed centrally and the cephalic vein was treated with the AngioJet thrombectomy device. The access pointing towards the arterial anastomosis was upsized to a 6-French sheath. A wire was advanced into the arterial system. The arterial plug was pulled using a 5 [place] Fogarty balloon. The Fogarty balloon was pulled across the arterial anastomosis three additional times until there was some flow within the fistula.  At this point, there appeared to be an outflow obstruction. Pull-back venograms demonstrated stenosis in the central cephalic vein at the right shoulder. Central cephalic vein was treated multiple times with a 6 mm x 40 mm Conquest balloon. There was still very poor flow through the graft. As a result, the wire was removed and the aneurysmal segments were treated with the PTD  thrombectomy device. After using the PTD thrombectomy device, there was markedly improved flow throughout the fistula. Venograms demonstrated residual clot and stenosis at the central cephalic vein. This area was treated again with the 6 mm balloon. Follow venograms demonstrated persistent poor flow in this area due to thrombus. This area was treated again with the Angiojet device. Following treatment with the Angiojet device, there was markedly improved flow through the central cephalic vein. 5 French catheter was advanced into the upper arm artery and the arterial anastomosis was evaluated with an arteriogram. The sheath pointing towards the anastomosis was removed with a pursestring suture. Follow up venogram demonstrated residual narrowing where the sheath was removed. This segment of the vein was treated with an 8 mm x 40 mm Conquest balloon and follow-up angiogram images were obtained. The remaining vascular sheath was removed with a pursestring.  Estimated blood loss: Minimal  FINDINGS: Ultrasound demonstrated an occluded right upper arm cephalic vein. 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 Thrombus extends up to the right shoulder by ultrasound. Venogram also demonstrated thrombus throughout the right upper arm cephalic vein. In addition, there was a stenosis in the central cephalic vein. After the declot procedure, there was flow throughout the cephalic vein fistula. There was residual non-occlusive thrombus in the aneurysmal segments in the distal humeral region. Arterial anastomosis is patent. Central veins patent.  Complication:  None  IMPRESSION: Successful declot of the right upper arm fistula.  The right upper arm fistula remains amendable to percutaneous treatment.   Electronically Signed   By: Richarda Overlie M.D.   On: 01/28/2015 16:50   Ir Declot Right Mod Sed  01/28/2015   CLINICAL DATA:  44 year old with end-stage renal disease and right upper arm cephalic  vein fistula. Poor flows in the right upper arm fistula. Patient was recently admitted for a brainstem non-hemorrhagic stroke  EXAM: DIALYSIS FISTULA DECLOT ; CEPHALIC VEIN ANGIOPLASTY; ULTRASOUND GUIDANCE  FOR VASCULAR ACCESS  Physician: Rachelle Hora. Lowella Dandy, MD  FLUOROSCOPY TIME:  20 minutes and 30 seconds, 232 mGy  MEDICATIONS AND MEDICAL HISTORY: Heparin 3000 units, TPA 2 mg, Versed 2 mg, Fentanyl 150 mcg. A radiology nurse monitored the patient for moderate sedation.  ANESTHESIA/SEDATION: Moderate sedation time: 90 minutes  CONTRAST:  Ninety ml Omnipaque 300  PROCEDURE: Informed consent was obtained with a Nurse, learning disability. The right upper arm was evaluated with ultrasound. 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 The right upper arm was prepped and draped in a sterile fashion. Maximal barrier sterile technique was utilized including caps, mask, sterile gowns, sterile gloves, sterile drape, hand hygiene and skin antiseptic. The skin was anesthetized with 1% lidocaine. The fistula was accessed using 21 gauge needles towards the central venous system and arterial anastomosis with ultrasound guidance. Ultrasound images were obtained for documentation. Micropuncture catheters were placed. The vascular access pointing towards the central veins was upsized to a 6-French vascular sheath. A 5-French catheter was advanced into the central venous structures and a central venogram was performed. Heparin was given through this 5 [place] catheter. 2 mg of tPA was infused along the occluded cephalic vein as the catheter was slowly pulled back. Fluoroscopic images were saved for documentation. A Bentson wire was placed centrally and the cephalic vein was treated with the AngioJet thrombectomy device. The access pointing towards the arterial anastomosis was upsized to a 6-French sheath. A wire was advanced into the arterial system. The arterial plug was pulled using a 5 [place]  Fogarty balloon. The Fogarty balloon was pulled across the arterial anastomosis three additional times until there was some flow within the fistula.  At this point, there appeared to be an outflow obstruction. Pull-back venograms demonstrated stenosis in the central cephalic vein at the right shoulder. Central cephalic vein was treated multiple times with a 6 mm x 40 mm Conquest balloon. There was still very poor flow through the graft. As a result, the wire was removed and the aneurysmal segments were treated with the PTD thrombectomy device. After using the PTD thrombectomy device, there was markedly improved flow throughout the fistula. Venograms demonstrated residual clot and stenosis at the central cephalic vein. This area was treated again with the 6 mm balloon. Follow venograms demonstrated persistent poor flow in this area due to thrombus. This area was treated again with the Angiojet device. Following treatment with the Angiojet device, there was markedly improved flow through the central cephalic vein. 5 French catheter was advanced into the upper arm artery and the arterial anastomosis was evaluated with an arteriogram. The sheath pointing towards the anastomosis was removed with a pursestring suture. Follow up venogram demonstrated residual narrowing where the sheath was removed. This segment of the vein was treated with an 8 mm x 40 mm Conquest balloon and follow-up angiogram images were obtained. The remaining vascular sheath was removed with a pursestring.  Estimated blood loss: Minimal  FINDINGS: Ultrasound demonstrated an occluded right upper arm cephalic vein. 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 Thrombus extends up to the right shoulder by ultrasound. Venogram also demonstrated thrombus throughout the right upper arm cephalic vein. In addition, there was a stenosis in the central cephalic vein. After the declot procedure, there was flow throughout the  cephalic vein fistula. There was residual non-occlusive thrombus in the aneurysmal segments in the distal humeral region. Arterial anastomosis is patent. Central  veins patent.  Complication:  None  IMPRESSION: Successful declot of the right upper arm fistula.  The right upper arm fistula remains amendable to percutaneous treatment.   Electronically Signed   By: Richarda Overlie M.D.   On: 01/28/2015 16:50    Scheduled Meds: . atorvastatin  80 mg Oral q1800  . calcitRIOL  0.25 mcg Oral Q M,W,F-HD  . clopidogrel  75 mg Oral Daily  . [START ON 01/30/2015] darbepoetin (ARANESP) injection - DIALYSIS  60 mcg Intravenous Q Sat-HD  . fenofibrate  160 mg Oral Daily  . insulin aspart  0-5 Units Subcutaneous QHS  . insulin aspart  0-9 Units Subcutaneous TID WC  . insulin glargine  5 Units Subcutaneous QHS  . levothyroxine  25 mcg Oral QAC breakfast  . multivitamin  1 tablet Oral QHS  . sodium chloride  3 mL Intravenous Q12H  . sucroferric oxyhydroxide  500 mg Oral TID WC   Continuous Infusions:   Principal Problem:   Brainstem stroke Active Problems:   ESRD (end stage renal disease)   Diabetes   CVA (cerebral infarction)   Life long cognitive dysfunction   Hypothyroidism   Hyperlipidemia   Anemia   Hyperkalemia   Leukocytosis   UTI (urinary tract infection)   Weakness    Time spent: 30 minutes    Andra Matsuo MD [facility] Pager [phone]. If 7PM-7AM, please contact night-coverage at [email or web address], password [email or web address] 01/29/2015, 6:57 PM  LOS: 6 days

## 2015-01-29 NOTE — Progress Notes (Signed)
Inpatient Diabetes Program Recommendations  AACE/ADA: New Consensus Statement on Inpatient Glycemic Control (2013)  Target Ranges:  Prepandial:   less than 140 mg/dL      Peak postprandial:   less than 180 mg/dL (1-2 hours)      Critically ill patients:  140 - 180 mg/dL   Results for Natalie [name], Edgar (MRN [id number]) as of 01/29/2015 11:12  Ref. Range 01/28/2015 07:05 01/28/2015 11:41 01/28/2015 16:38 01/28/2015 22:50 01/29/2015 06:18  Glucose-Capillary Latest Range: 70-99 mg/dL 409154 (H) 811119 (H) 914123 (H) 194 (H) 216 (H)   Reason for assessment: elevated CBG Diabetes history: Type 2 Outpatient Diabetes medications: Glipizide 5mg /day Current orders for Inpatient glycemic control: Lantus 5 units /day, Novolog sensitive correction scale tid and hs  Post prandial blood sugars elevated- consider increasing Novolog correction to the moderate scale tid and hs.  If fasting blood sugars remain elevated tomorrow, consider increasing basal insulin to 9 units tomorrow (0.15u/kg)  Susette RacerJulie Elaina Cara, RN, [place], [place]HA, CDE Diabetes Coordinator Inpatient Diabetes Program  [phone] (Team Pager) [phone] Patrcia Dolly([place] Office) 01/29/2015 11:16 AM

## 2015-01-29 NOTE — Progress Notes (Signed)
Physical Therapy Treatment Patient Details Name: Natalie [name] MRN: [id number] DOB: 1971/01/14 Today's Date: 01/29/2015    History of Present Illness 44 yo female, with history of HTN, DM, and ESRD admitted for CVA, difficulty speaking, swallowing, numbness, and nausea on admit. MRI- + Acute brainstem infarct. Left paracentral pons lacune with no mass effect or hemorrhage.    PT Comments    Patient awake and agreeable to therapy even after a difficult night. Niece present and translating. Natalie [name] from [facility] in during session and stated due to recent medical issues that they will plan on admitting patient on Monday. Will continue with current POC and progress with mobility as tolerated.   Follow Up Recommendations  CIR;Supervision/Assistance - 24 hour     Equipment Recommendations       Recommendations for Other Services       Precautions / Restrictions Precautions Precautions: Fall    Mobility  Bed Mobility Overal bed mobility: Needs Assistance       Supine to sit: Min assist     General bed mobility comments: Min A for control and elevation trunk to EOB.   Transfers Overall transfer level: Needs assistance Equipment used: 2 person hand held assist Transfers: Sit to/from Stand Sit to Stand: Min assist;+2 safety/equipment         General transfer comment: Min A for stability and balance. A to power up and translate weight anteriorly with stand.   Ambulation/Gait Ambulation/Gait assistance: Min assist;+2 physical assistance Ambulation Distance (Feet): 40 Feet Assistive device: 2 person hand held assist Gait Pattern/deviations: Drifts right/left;Staggering right;Staggering left;Scissoring;Ataxic;Shuffle     General Gait Details: Patient ataxic and unstedy with gait this session. +2 min A for safety and stability. Decreased R foot clearance again this session. Limited by fatigue.    Stairs            Wheelchair Mobility    Modified Rankin (Stroke  Patients Only) Modified Rankin (Stroke Patients Only) Pre-Morbid Rankin Score: No symptoms Modified Rankin: Moderately severe disability     Balance     Sitting balance-Leahy Scale: Fair       Standing balance-Leahy Scale: Poor Standing balance comment: Patient requires Min A with functional standing balance. Cues for safety and positioning                     Cognition Arousal/Alertness: Awake/alert Behavior During Therapy: WFL for tasks assessed/performed Overall Cognitive Status: History of cognitive impairments - at baseline                      Exercises      General Comments General comments (skin integrity, edema, etc.): New R UE fistula placement 3/31      Pertinent Vitals/Pain Faces Pain Scale: Hurts little more Pain Location: R UE (new fistula placement) Pain Intervention(s): Monitored during session;Patient requesting pain meds-RN notified    Home Living                      Prior Function            PT Goals (current goals can now be found in the care plan section) Progress towards PT goals: Progressing toward goals    Frequency  Min 4X/week    PT Plan Current plan remains appropriate    Co-evaluation             End of Session   Activity Tolerance: Patient tolerated treatment well;Patient limited by fatigue Patient  left: in chair;with call bell/phone within reach;with family/visitor present     Time: 0922-0951 PT Time Calculation (min) (ACUTE ONLY): 29 min  Charges:  $Gait Training: 8-22 mins                    G Codes:      Natalie BirksRobinette, Julia Elizabeth 01/29/2015, 10:13 AM  01/29/2015 Natalie [name], Julia Elizabeth PTA [phone] pager [phone] office

## 2015-01-29 NOTE — Progress Notes (Signed)
Occupational Therapy Treatment Patient Details Name: Natalie [name] MRN: [id number] DOB: 28-Dec-1970 Today's Date: 01/29/2015    History of present illness 44 yo female, with history of HTN, DM, and ESRD admitted for CVA, difficulty speaking, swallowing, numbness, and nausea on admit. MRI- + Acute brainstem infarct. Left paracentral pons lacune with no mass effect or hemorrhage.   OT comments  Patient progressing towards goals, continue plan of care for now. Patient will benefit from extensive comprehensive rehab to increase overall independence with self-care tasks, functional mobility, functional use of RUE, and overall quality of life.    Follow Up Recommendations  CIR    Equipment Recommendations  Other (comment) (TBD, next venue of care)    Recommendations for Other Services  None at this time   Precautions / Restrictions Precautions Precautions: Fall Restrictions Weight Bearing Restrictions: No    Mobility Bed Mobility Overal bed mobility: Needs Assistance Bed Mobility: Rolling;Sidelying to Sit Rolling: Min assist Sidelying to sit: Min assist;HOB elevated Supine to sit: Min assist     General bed mobility comments: Min A for control and elevation trunk to EOB. Patient's niece assisted with bed mobility  Transfers Overall transfer level: Needs assistance Equipment used: 2 person hand held assist Transfers: Sit to/from Stand Sit to Stand: Min assist;+2 safety/equipment    General transfer comment: Min A for stability and balance. A to power up and translate weight anteriorly with stand.     Balance Overall balance assessment: Needs assistance Sitting-balance support: Feet supported;No upper extremity supported Sitting balance-Leahy Scale: Fair     Standing balance support: No upper extremity supported;During functional activity Standing balance-Leahy Scale: Poor Standing balance comment: Patient requires Min A with functional standing balance. Cues for safety  and positioning    ADL General ADL Comments: Patient with baseline cognitive impairments and speaks spanish as her first language. Patient with ataxic movements during functional mobility. Complaints of stomach cramps. Patient transferred onto elevated toilet seat for toileting needs. Then stood at sink for grooming tasks of washing hands and combing hair. Educated patient on AAROM exercises > RUE to increase strength and functional use.      Cognition   Behavior During Therapy: WFL for tasks assessed/performed Overall Cognitive Status: History of cognitive impairments - at baseline                  Pertinent Vitals/ Pain       Pain Assessment: Faces Faces Pain Scale: Hurts little more Pain Location: stomach and right UE (new fistula placement)  Pain Descriptors / Indicators: Cramping;Burning Pain Intervention(s): Monitored during session;Patient requesting pain meds-RN notified         Frequency Min 2X/week     Progress Toward Goals  OT Goals(current goals can now be found in the care plan section)  Progress towards OT goals: Progressing toward goals     Plan Discharge plan remains appropriate    Co-evaluation    PT/OT/SLP Co-Evaluation/Treatment: Yes Reason for Co-Treatment: For patient/therapist safety   OT goals addressed during session: ADL's and self-care;Strengthening/ROM      End of Session Equipment Utilized During Treatment: Rolling walker   Activity Tolerance Patient tolerated treatment well   Patient Left in chair;with call bell/phone within reach;with family/visitor present   Nurse Communication Patient requests pain meds     Time: 0922-0950 OT Time Calculation (min): 28 min  Charges: OT General Charges $OT Visit: 1 Procedure OT Treatments $Self Care/Home Management : 8-22 mins  Tulio Facundo , MS, OTR/L, CLT Pager:  [phone]  01/29/2015, 10:56 AM

## 2015-01-29 NOTE — Progress Notes (Signed)
I met with pt with her niece at bedside this morning as she was working with therapy. I have discussed with Dr. Naaman Plummer this morning. We will await and follow up Monday with a possible admission to inpt rehab on Monday. Noted issues early this morning in dialysis. [phone]

## 2015-01-29 NOTE — Progress Notes (Signed)
SLP Cancellation Note  Patient Details Name: Natalie [name] MRN: [id number] DOB: 1970/11/18   Cancelled treatment:        Pt not in room.    Zenna Traister, Riley NearingBonnie Caroline 01/29/2015, 1:02 PM

## 2015-01-29 NOTE — Progress Notes (Signed)
EEG Completed; Results Pending  

## 2015-01-29 NOTE — Progress Notes (Signed)
During hemodialysis Tx pts eyes rolled back, large amts of thick brown emesis started rolling out of her mouth and HR noted to drop to 50. Pt was unresponsive and stiff while this was occurring(approx 30sec), she then began moaning and moving her L arm and crying out and her HR returned back to baseline.Dr. Lowell GuitarPowell called and informed of this, UF goal decreased to keep even. Shortly after the episode niece was brought in to interpret speech. States she does not see any change in her mental status

## 2015-01-29 NOTE — Procedures (Signed)
ELECTROENCEPHALOGRAM REPORT  Date of Study: 01/29/2015  Patient's Name: Natalie [name] MRN: [id number] Date of Birth: [birth year]  Referring Provider: Dr. Kathlen ModyVijaya Akula  Clinical History: This is a 44 year old woman with drooling, dysarthria, R-sided weakness with increased difficulty walking. MRI of the brain showed acute brainstem infarct.  Medications: acetaminophen (TYLENOL) tablet 650 mg atorvastatin (LIPITOR) tablet 80 mg calcitRIOL (ROCALTROL) capsule 0.25 mcg clopidogrel (PLAVIX) tablet 75 mg Darbepoetin Alfa (ARANESP) injection 60 mcg fenofibrate tablet 160 mg hydrOXYzine (ATARAX/VISTARIL) tablet 25 mg levothyroxine (SYNTHROID, LEVOTHROID) tablet 25 mcg sucroferric oxyhydroxide (VELPHORO) chewable tablet 500 mg zolpidem (AMBIEN) tablet 5 mg  Technical Summary: A multichannel digital EEG recording measured by the international 10-20 system with electrodes applied with paste and impedances below 5000 ohms performed in our laboratory with EKG monitoring in a predominantly drowsy and asleep patient.  Hyperventilation and photic stimulation were not performed.  The digital EEG was referentially recorded, reformatted, and digitally filtered in a variety of bipolar and referential montages for optimal display.    Description: The patient is predominantly drowsy and asleep during the recording.  During brief period of wakefulness, there is a symmetric, medium voltage 10 Hz posterior dominant rhythm that attenuates with eye opening.  The record is symmetric.  During drowsiness and sleep, there is an increase in theta and delta slowing of the background.  Vertex waves and symmetric sleep spindles were seen.  Hyperventilation and photic stimulation were not performed. There were no epileptiform discharges or electrographic seizures seen.  EKG artifact is seen during the recording.  EKG lead was unremarkable.  Impression: This predominantly drowsy and asleep EEG is normal.     Clinical Correlation: A normal EEG does not exclude a clinical diagnosis of epilepsy.  Clinical correlation is advised.   Patrcia DollyKaren Aquino, M.D.

## 2015-01-29 NOTE — Progress Notes (Signed)
[facility] Progress Note  Assessment/Plan: 1. Acute brainstem (pons) infarct with history of chronic lacunar infracts - work up per Neuro - so far Marland Kitchen^^ Chol,^^^ TG - started statin, PT worked with pt this am - said some balance issues but pt said that wasn't new - he felt she needed ongoing PT. Being evaluated for inpt rehab vs home with family- family refusing NHP- now with possible sz ? I think family is interested in EEG ?? Per primary 2. ESRD - TTS - had HD Thursday and Monday and Wednesday off schedule- had declot yest and ended up needing HD late last night for high K- so will plan for HD in AM to get back on schedule 3.  Hypertension/volume - CXR NAD on admission - BP 120 - 150 - in usual range; - volume is not an issue 4. Anemia - Hgb down to 10's -add ESA 5. Metabolic bone disease - Continue calcitriol at current dose - P up - have changed binder to velphoro due to size of pills  - D 3 diet could be higher in P 6. Nutrition - D3 diet /thin liquids+ renavite 7. DM - on glucotrol as an outpt - per primary - BS 160 - 250 on SSI 8. Hypothyroidism - TSH -18 -synthroid started 9. Disposition - Evaluated by rehab - may have support problems after d/c - has not been accepted yet- this is likely to be an issue  10. llq pain- now could be from kayexalate and diarrhea- family wanting it investigated - LFTs OK    Aaliyah Gavel A   01/29/2015,9:25 AM  LOS: 6 days       Subjective:   Events noted- had fistulogram which turned in to a declot- potassium was high last night, s/p HD- had another "episode" of going stiff but recovered quickly- no documented low BP- daughter here- concerned that these "events " may be sz and also is having left lower quadrant pain and wants that investigated   Objective Filed Vitals:   01/29/15 0400 01/29/15 0426 01/29/15 0435 01/29/15 0500  BP: 127/78 122/77 150/81 112/71  Pulse: 96 97 102 103  Temp:  98.2 F (36.8 C)    TempSrc:  Oral     Resp: 14 14 16 17   Height:      Weight:  61.6 kg (135 lb 12.9 oz)    SpO2:       Physical Exam General: supine ,  resting Heart: RRR Lungs: no rales Abdomen: soft NT Extremities: SCDs no overt edema Dialysis Access: right upper AVF patent with stitches  Dialysis Orders: [facility] 4 hr EDW 60.5 180 440/A 1.5 2K 2 Ca AVF no profile Mircera 50 q 4 weeks heparin 3500 calcitriol 0.25 - recently lowered from 0.75 due to ^ corr Ca iPTH 639 3/24 hgb 11.4 17%sat 3/24 - ferritin 1473 10/2014  Additional Objective Labs: Basic Metabolic Panel:  Recent Labs Lab 01/25/15 0830 01/26/15 0554 01/27/15 0539  01/28/15 0638 01/28/15 1920 01/29/15 0139  NA 134* 135 136  < > 135 132* 136  K 5.9* 5.3* 6.2*  < > 6.0* >7.5* 5.0  CL 101 97 99  < > 97 94* 96  CO2 17* 26 22  < > 24 20 22   GLUCOSE 160* 142* 162*  < > 143* 240* 146*  BUN 74* 33* 52*  < > 40* 53* 57*  CREATININE 9.73* 6.25* 8.19*  < > 7.22* 8.51* 9.05*  CALCIUM 9.0 9.2 9.4  < > 9.7  9.3 9.0  PHOS 6.1* 5.7* 6.9*  --   --   --   --   < > = values in this interval not displayed. Liver Function Tests:  Recent Labs Lab 01/23/15 1150 01/24/15 0445  01/26/15 0554 01/27/15 0539 01/27/15 1917  AST 25 23  --   --   --  37  ALT 23 22  --   --   --  27  ALKPHOS 115 94  --   --   --  90  BILITOT 0.7 0.6  --   --   --  0.6  PROT 8.2 7.5  --   --   --  8.3  ALBUMIN 3.8 3.2*  < > 3.4* 3.6 3.6  < > = values in this interval not displayed.  Recent Labs Lab 01/27/15 1917  LIPASE 37   CBC:  Recent Labs Lab 01/23/15 1150  01/24/15 0445 01/25/15 0830 01/26/15 0554 01/27/15 0539 01/28/15 0638  WBC 13.0*  --  11.1* 10.9* 11.9* 12.5* 16.2*  NEUTROABS 8.9*  --   --   --   --   --   --   HGB 12.7  < > 11.4* 11.1* 11.9* 11.6* 10.3*  HCT 37.6  < > 34.8* 34.0* 36.6 36.7 32.0*  MCV 95.4  --  96.1 95.2 97.1 98.4 97.3  PLT 464*  --  447* 477* 424* 463* 450*  < > = values in this interval not displayed. Blood Culture    Component Value  Date/Time   SDES URINE, RANDOM 01/25/2015 0337   SPECREQUEST NONE 01/25/2015 0337   CULT  01/25/2015 0337    INSIGNIFICANT GROWTH Performed at [facility]    REPTSTATUS 01/26/2015 FINAL 01/25/2015 0337    Cardiac Enzymes:  Recent Labs Lab 01/23/15 1150  TROPONINI <0.03   CBG:  Recent Labs Lab 01/28/15 0705 01/28/15 1141 01/28/15 1638 01/28/15 2250 01/29/15 0618  GLUCAP 154* 119* 123* 194* 216*  Medications:   . atorvastatin  80 mg Oral q1800  . calcitRIOL  0.25 mcg Oral Q M,W,F-HD  . clopidogrel  75 mg Oral Daily  . fenofibrate  160 mg Oral Daily  . insulin aspart  0-5 Units Subcutaneous QHS  . insulin aspart  0-9 Units Subcutaneous TID WC  . insulin glargine  5 Units Subcutaneous QHS  . levothyroxine  25 mcg Oral QAC breakfast  . multivitamin  1 tablet Oral QHS  . ondansetron      . sodium chloride  3 mL Intravenous Q12H  . sucroferric oxyhydroxide  500 mg Oral TID WC

## 2015-01-30 LAB — PROCALCITONIN: Procalcitonin: 0.71 ng/mL

## 2015-01-30 LAB — GLUCOSE, CAPILLARY
Glucose-Capillary: 121 mg/dL — ABNORMAL HIGH (ref 70–99)
Glucose-Capillary: 104 mg/dL — ABNORMAL HIGH (ref 70–99)
Glucose-Capillary: 196 mg/dL — ABNORMAL HIGH (ref 70–99)
Glucose-Capillary: 189 mg/dL — ABNORMAL HIGH (ref 70–99)

## 2015-01-30 LAB — BASIC METABOLIC PANEL
Anion gap: 11 (ref 5–15)
BUN: 38 mg/dL — ABNORMAL HIGH (ref 6–23)
CALCIUM: 8.6 mg/dL (ref 8.4–10.5)
CHLORIDE: 95 mmol/L — AB (ref 96–112)
CO2: 29 mmol/L (ref 19–32)
Creatinine, Ser: 7.5 mg/dL — ABNORMAL HIGH (ref 0.50–1.10)
GFR calc Af Amer: 7 mL/min — ABNORMAL LOW (ref >90)
GFR, EST NON AFRICAN AMERICAN: 6 mL/min — AB (ref >90)
Glucose, Bld: 127 mg/dL — ABNORMAL HIGH (ref 70–99)
POTASSIUM: 3.7 mmol/L (ref 3.5–5.1)
Sodium: 135 mmol/L (ref 135–145)

## 2015-01-30 MED ORDER — GUAIFENESIN ER 600 MG PO TB12: 600.0000 mg | ORAL_TABLET | Freq: Two times a day (BID) | ORAL | Status: DC

## 2015-01-30 MED ORDER — GUAIFENESIN 100 MG/5ML PO SYRP
200.0000 mg | ORAL_SOLUTION | ORAL | Status: DC | PRN
  Administered 2015-01-30 – 2015-01-31 (×2): 200 mg via ORAL
  Filled 2015-01-30 (×4): qty 10

## 2015-01-30 MED ORDER — DARBEPOETIN ALFA 60 MCG/0.3ML IJ SOSY
PREFILLED_SYRINGE | INTRAMUSCULAR | Status: AC
  Filled 2015-01-30: qty 0.3

## 2015-01-30 MED ORDER — ONDANSETRON HCL 4 MG/2ML IJ SOLN
INTRAMUSCULAR | Status: AC
  Filled 2015-01-30: qty 2

## 2015-01-30 NOTE — Procedures (Signed)
Patient was seen on dialysis and the procedure was supervised.  BFR 400  Via AVF BP is  125/69.   Patient appears to be tolerating treatment well  Natalie [name] A 01/30/2015

## 2015-01-30 NOTE — Progress Notes (Signed)
Pt refuses SCD for VTE. Pt and family educated of risks for not wearing device.

## 2015-01-30 NOTE — Progress Notes (Signed)
[facility] PROGRESS NOTE  Natalie [name] NWG:[id number] DOB: 1971/03/27 DOA: 01/23/2015 PCP: No primary care provider on file. INTERIM SUMMARY:  44 year old LADY WITH h/o hypertension , DM, ESRD on HD, mild MR comes in for speech difficulties and nausea. She was found to have acute brain stem infarct. Therapy evals orderd and recommended CIR. Currently awaiting CIR recommendations. Patient seen with the interpretor at bedside. No new complaints today.  Assessment/Plan: #1 acute brainstem infarct. Left paracentral troponins lacunar with no mass effect or hemorrhage Patient  noted to have chronic lacunar infarcts of the right pons and left basal ganglia/corona radiata.  Carotid Dopplers with no significant ICA stenosis from preliminary reading. 2-D echo reviewed showed LVEF OF 60 TO 65% with no regional wall abnormalities, doppler parameters are consistent with abnormal left ventricular relaxation. Unable to calculate LDL secondary to triglycerides of 526. Hemoglobin A1c is 7.8. Continue statin and TriCor. Aspirin changed to plavix for secondary stroke prevention. PT/OT/ST evals done and recommending inpatient rehabilitation. Neurology following and appreciate input and recommendations.  currently awaiting to hear from inpatient rehabilitation for bed on monday.   #2 hypotension: Resolved.   #3 hypothyroidism TSH elevated at 18.051. Continue Synthroid 25 g daily. Will need outpatient follow-up with thyroid function studies repeated in 4-6 weeks.  #4 bacteria in urine Urine cultures with insignificant growth. IV antibiotics are discontinued.   #5 diabetes mellitus Hemoglobin A1c = 7.8.  CBG (last 3)   Recent Labs  01/29/15 2207 01/30/15 0604 01/30/15 1312  GLUCAP 202* 121* 104*    On SSI and low dose lantus.   #6 end-stage renal disease on hemodialysis Tuesdays Thursdays Saturdays Patient for HD on TTS  #7 hyperkalemia RESOLVED.  Repeat K THIS AM IS 3.7    #8  Hyperlipidemia Cholesterol = 305, TG 526. Continue statin and tricor.  #9 leukocytosis Likely reactive leukocytosis. Urine cultures with insignificant growth. Chest x-ray negative for any acute infiltrates. procalcitonin levels are 0.67 and 0.71.  #10 prophylaxis SCDs for DVT prophylaxis.   #11 nausea , vomiting and an episode of unresponsiveness on 3/31: Probably from vaso vagal effect from orthostatic hypotension post HD. She was given a bolus of 250 ml of NS. And she became awake and alert and oriented a few min after the episode. The episode happened when she got up from the sitting position after her BM. Liver panel normal. Lipase is normal. CXR negative for aspiration. EEG ordered to evaluate for seizures and was negative .   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 Reestablished flow in the right upper arm fistula.    #13 persistent abdominal discomfort: CT abd and pelvis ordered and is negative for acute pathology.  Continue to monitor.  Her pain is much better after the BM.       Code Status: Full Family Communication: discussed with family at bedside.  Disposition Plan: CIR when bed available.   Consultants:  Nephrology: Dr. Arlean HoppingSchertz 01/24/2015  Neurology: Dr. Amada JupiterKirkpatrick 01/23/2015  Procedures:  CT head 01/23/2015  Chest x-ray 01/23/2015  MRI head 01/23/2015  Carotid dopplers 01/24/15  2 d echo 01/26/2015  Antibiotics:  Oral Cipro 01/23/15 >>>01/24/15  IV Rocephin 01/24/15>>>> 01/26/2015  HPI/Subjective: Feeling better. No new complaints.   Objective: Filed Vitals:   01/30/15 1314  BP: 163/66  Pulse: 95  Temp: 98.7 F (37.1 C)  Resp: 20    Intake/Output Summary (Last 24 hours)  at 01/30/15 1640 Last data filed at 01/30/15 1230  Gross per 24 hour  Intake      0 ml  Output   1170 ml  Net  -1170 ml   Filed Weights   01/29/15  0426 01/30/15 0817 01/30/15 1230  Weight: 61.6 kg (135 lb 12.9 oz) 60 kg (132 lb 4.4 oz) 59 kg (130 lb 1.1 oz)    Exam:   General:  Comfortable.   Cardiovascular: RRR  Respiratory: CTAB anterior lung fields no wheezing or rhonchi.   Abdomen: Soft, nontender, nondistended, positive bowel sounds.  Musculoskeletal: No clubbing cyanosis or edema.  Data Reviewed: Basic Metabolic Panel:  Recent Labs Lab 01/25/15 0830 01/26/15 0554 01/27/15 0539 01/27/15 1917 01/28/15 1610 01/28/15 1920 01/29/15 0139 01/30/15 0553  NA 134* 135 136 134* 135 132* 136 135  K 5.9* 5.3* 6.2* 6.3* 6.0* >7.5* 5.0 3.7  CL 101 97 99 95* 97 94* 96 95*  CO2 17* GLUCOSE 160* 142* 162* 219* 143* 240* 146* 127*  BUN 74* 33* 52* 31* 40* 53* 57* 38*  CREATININE 9.73* 6.25* 8.19* 6.09* 7.22* 8.51* 9.05* 7.50*  CALCIUM 9.0 9.2 9.4 9.5 9.7 9.3 9.0 8.6  PHOS 6.1* 5.7* 6.9*  --   --   --   --   --    Liver Function Tests:  Recent Labs Lab 01/24/15 0445 01/25/15 0830 01/26/15 0554 01/27/15 0539 01/27/15 1917  AST 23  --   --   --  37  ALT 22  --   --   --  27  ALKPHOS 94  --   --   --  90  BILITOT 0.6  --   --   --  0.6  PROT 7.5  --   --   --  8.3  ALBUMIN 3.2* 3.1* 3.4* 3.6 3.6    Recent Labs Lab 01/27/15 1917  LIPASE 37   No results for input(s): AMMONIA in the last 168 hours. CBC:  Recent Labs Lab 01/24/15 0445 01/25/15 0830 01/26/15 0554 01/27/15 0539 01/28/15 0638  WBC 11.1* 10.9* 11.9* 12.5* 16.2*  HGB 11.4* 11.1* 11.9* 11.6* 10.3*  HCT 34.8* 34.0* 36.6 36.7 32.0*  MCV 96.1 95.2 97.1 98.4 97.3  PLT 447* 477* 424* 463* 450*   Cardiac Enzymes: No results for input(s): CKTOTAL, CKMB, CKMBINDEX, TROPONINI in the last 168 hours. BNP (last 3 results) No results for input(s): BNP in the last 8760 hours.  ProBNP (last 3 results) No results for input(s): PROBNP in the last 8760 hours.  CBG:  Recent Labs Lab 01/29/15 1143 01/29/15 1620 01/29/15 2207  01/30/15 0604 01/30/15 1312  GLUCAP 254* 182* 202* 121* 104*    Recent Results (from the past 240 hour(s))  Culture, Urine     Status: None   Collection Time: 01/25/15  3:37 AM  Result Value Ref Range Status   Specimen Description URINE, RANDOM  Final   Special Requests NONE  Final   Colony Count   Final    9,000 COLONIES/ML Performed at [facility]    Culture   Final    INSIGNIFICANT GROWTH Performed at [facility]    Report Status 01/26/2015 FINAL  Final     Studies: Ct Abdomen Pelvis Wo Contrast  01/29/2015   CLINICAL DATA:  Abdominal pain.  EXAM: CT ABDOMEN AND PELVIS WITHOUT CONTRAST  TECHNIQUE: Multidetector CT imaging of the abdomen and pelvis was performed following the standard protocol  without IV contrast.  COMPARISON:  None.  FINDINGS: Minimal atelectasis is present in the lung bases. There is no pleural effusion.  Diffusely decreased attenuation of the liver is consistent with mild to moderate steatosis. The spleen is lobular in contour without focal abnormality identified. The gallbladder, adrenal glands, and pancreas have an unremarkable unenhanced appearance. There is marked bilateral renal atrophy.  Oral contrast is present in nondilated loops of small and large bowel to the level of the rectum without evidence of obstruction. The appendix is unremarkable. No gross bowel wall thickening or inflammation is identified.  Excreted contrast is present in the bladder, which is largely decompressed. The uterus and ovaries are identified. The uterus is anteverted. No pelvic mass is seen. Mild aortoiliac atherosclerotic calcification is noted. No free fluid or enlarged lymph nodes are identified. No acute osseous abnormality is identified.  IMPRESSION: 1. No acute abnormality identified in the abdomen or pelvis. 2. Marked bilateral renal atrophy. 3. Hepatic steatosis.   Electronically Signed   By: Sebastian Ache   On: 01/29/2015 14:54    Scheduled Meds: .  atorvastatin  80 mg Oral q1800  . calcitRIOL  0.25 mcg Oral Q M,W,F-HD  . clopidogrel  75 mg Oral Daily  . darbepoetin (ARANESP) injection - DIALYSIS  60 mcg Intravenous Q Sat-HD  . fenofibrate  160 mg Oral Daily  . insulin aspart  0-5 Units Subcutaneous QHS  . insulin aspart  0-9 Units Subcutaneous TID WC  . insulin glargine  5 Units Subcutaneous QHS  . levothyroxine  25 mcg Oral QAC breakfast  . multivitamin  1 tablet Oral QHS  . sodium chloride  3 mL Intravenous Q12H  . sucroferric oxyhydroxide  500 mg Oral TID WC   Continuous Infusions:   Principal Problem:   Brainstem stroke Active Problems:   ESRD (end stage renal disease)   Diabetes   CVA (cerebral infarction)   Life long cognitive dysfunction   Hypothyroidism   Hyperlipidemia   Anemia   Hyperkalemia   Leukocytosis   UTI (urinary tract infection)   Weakness    Time spent: 15 minutes    Hanne Kegg MD [facility] Pager [phone]. If 7PM-7AM, please contact night-coverage at [email or web address], password [facility] 01/30/2015, 4:40 PM  LOS: 7 days

## 2015-01-30 NOTE — Progress Notes (Signed)
[facility] Progress Note  Assessment/Plan: 1. Acute brainstem (pons) infarct with history of chronic lacunar infracts - work up per Neuro - so far ^^ Chol,^^^ TG - started statin,  Being evaluated for inpt rehab vs home with family- family refusing NHP- now with possible sz ? EEG done, results pending 2. ESRD - TTS via AVF - had HD Thursday and Monday and Wednesday off schedule- had declot Thurs and ended up needing HD late Thursday night for high K- HD today on schedule- AVF working well  3.  Hypertension/volume - CXR NAD on admission - BP good - volume is not an issue 4. Anemia - Hgb down to 10's -added ESA 5. Metabolic bone disease - Continue calcitriol at current dose - P up - have changed binder to velphoro due to size of pills  - D 3 diet could be higher in P 6. Nutrition - D3 diet /thin liquids+ renavite 7. DM - on glucotrol as an outpt - per primary - BS 160 - 250 on SSI 8. Hypothyroidism - TSH -18 -synthroid started 9. Disposition - Evaluated by rehab - may have support problems after d/c - has not been accepted yet- this is likely to be an issue  10. llq pain- better after BM   Jayion Schneck A   01/30/2015,9:40 AM  LOS: 7 days       Subjective:  Seen on HD- seems to be doing well    Objective Filed Vitals:   01/30/15 0822 01/30/15 0830 01/30/15 0900 01/30/15 0930  BP: 122/55 127/70 114/58 125/69  Pulse: 70 68 82 80  Temp:      TempSrc:      Resp:  13 14 14   Height:      Weight:      SpO2:       Physical Exam General: supine ,  resting Heart: RRR Lungs: no rales Abdomen: soft NT Extremities: SCDs no overt edema Dialysis Access: right upper AVF patent with stitches  D[facility] Orders: [facility] 4 hr EDW 60.5 180 440/A 1.5 2K 2 Ca AVF no profile Mircera 50 q 4 weeks heparin 3500 calcitriol 0.25 - recently lowered from 0.75 due to ^ corr Ca iPTH 639 3/24 hgb 11.4 17%sat 3/24 - ferritin 1473 10/2014  Additional Objective Labs: Basic  Metabolic Panel:  Recent Labs Lab 01/25/15 0830 01/26/15 0554 01/27/15 0539  01/28/15 1920 01/29/15 0139 01/30/15 0553  NA 134* 135 136  < > 132* 136 135  K 5.9* 5.3* 6.2*  < > >7.5* 5.0 3.7  CL 101 97 99  < > 94* 96 95*  CO2 17* 26 22  < > 20 22 29   GLUCOSE 160* 142* 162*  < > 240* 146* 127*  BUN 74* 33* 52*  < > 53* 57* 38*  CREATININE 9.73* 6.25* 8.19*  < > 8.51* 9.05* 7.50*  CALCIUM 9.0 9.2 9.4  < > 9.3 9.0 8.6  PHOS 6.1* 5.7* 6.9*  --   --   --   --   < > = values in this interval not displayed. Liver Function Tests:  Recent Labs Lab 01/23/15 1150 01/24/15 0445  01/26/15 0554 01/27/15 0539 01/27/15 1917  AST 25 23  --   --   --  37  ALT 23 22  --   --   --  27  ALKPHOS 115 94  --   --   --  90  BILITOT 0.7 0.6  --   --   --  0.6  PROT 8.2 7.5  --   --   --  8.3  ALBUMIN 3.8 3.2*  < > 3.4* 3.6 3.6  < > = values in this interval not displayed.  Recent Labs Lab 01/27/15 1917  LIPASE 37   CBC:  Recent Labs Lab 01/23/15 1150  01/24/15 0445 01/25/15 0830 01/26/15 0554 01/27/15 0539 01/28/15 0638  WBC 13.0*  --  11.1* 10.9* 11.9* 12.5* 16.2*  NEUTROABS 8.9*  --   --   --   --   --   --   HGB 12.7  < > 11.4* 11.1* 11.9* 11.6* 10.3*  HCT 37.6  < > 34.8* 34.0* 36.6 36.7 32.0*  MCV 95.4  --  96.1 95.2 97.1 98.4 97.3  PLT 464*  --  447* 477* 424* 463* 450*  < > = values in this interval not displayed. Blood Culture    Component Value Date/Time   SDES URINE, RANDOM 01/25/2015 0337   SPECREQUEST NONE 01/25/2015 0337   CULT  01/25/2015 0337    INSIGNIFICANT GROWTH Performed at [facility]    REPTSTATUS 01/26/2015 FINAL 01/25/2015 0337    Cardiac Enzymes:  Recent Labs Lab 01/23/15 1150  TROPONINI <0.03   CBG:  Recent Labs Lab 01/29/15 0618 01/29/15 1143 01/29/15 1620 01/29/15 2207 01/30/15 0604  GLUCAP 216* 254* 182* 202* 121*  Medications:   . atorvastatin  80 mg Oral q1800  . calcitRIOL  0.25 mcg Oral Q M,W,F-HD  .  clopidogrel  75 mg Oral Daily  . Darbepoetin Alfa      . darbepoetin (ARANESP) injection - DIALYSIS  60 mcg Intravenous Q Sat-HD  . fenofibrate  160 mg Oral Daily  . insulin aspart  0-5 Units Subcutaneous QHS  . insulin aspart  0-9 Units Subcutaneous TID WC  . insulin glargine  5 Units Subcutaneous QHS  . levothyroxine  25 mcg Oral QAC breakfast  . multivitamin  1 tablet Oral QHS  . sodium chloride  3 mL Intravenous Q12H  . sucroferric oxyhydroxide  500 mg Oral TID WC

## 2015-01-31 ENCOUNTER — Inpatient Hospital Stay (HOSPITAL_COMMUNITY): Payer: Medicaid Other

## 2015-01-31 LAB — BASIC METABOLIC PANEL
ANION GAP: 15 (ref 5–15)
BUN: 25 mg/dL — ABNORMAL HIGH (ref 6–23)
CO2: 28 mmol/L (ref 19–32)
CREATININE: 5.95 mg/dL — AB (ref 0.50–1.10)
Calcium: 9.7 mg/dL (ref 8.4–10.5)
Chloride: 91 mmol/L — ABNORMAL LOW (ref 96–112)
GFR calc Af Amer: 9 mL/min — ABNORMAL LOW (ref >90)
GFR, EST NON AFRICAN AMERICAN: 8 mL/min — AB (ref >90)
GLUCOSE: 181 mg/dL — AB (ref 70–99)
Potassium: 4.2 mmol/L (ref 3.5–5.1)
Sodium: 134 mmol/L — ABNORMAL LOW (ref 135–145)

## 2015-01-31 LAB — CBC
HEMATOCRIT: 29.9 % — AB (ref 36.0–46.0)
Hemoglobin: 9.5 g/dL — ABNORMAL LOW (ref 12.0–15.0)
MCH: 31 pg (ref 26.0–34.0)
MCHC: 31.8 g/dL (ref 30.0–36.0)
MCV: 97.7 fL (ref 78.0–100.0)
Platelets: 513 10*3/uL — ABNORMAL HIGH (ref 150–400)
RBC: 3.06 MIL/uL — ABNORMAL LOW (ref 3.87–5.11)
RDW: 16.2 % — ABNORMAL HIGH (ref 11.5–15.5)
WBC: 16 10*3/uL — AB (ref 4.0–10.5)

## 2015-01-31 LAB — GLUCOSE, CAPILLARY
GLUCOSE-CAPILLARY: 105 mg/dL — AB (ref 70–99)
Glucose-Capillary: 140 mg/dL — ABNORMAL HIGH (ref 70–99)
Glucose-Capillary: 298 mg/dL — ABNORMAL HIGH (ref 70–99)
Glucose-Capillary: 146 mg/dL — ABNORMAL HIGH (ref 70–99)

## 2015-01-31 MED ORDER — LEVOFLOXACIN IN D5W 750 MG/150ML IV SOLN
750.0000 mg | Freq: Once | INTRAVENOUS | Status: AC
Start: 2015-01-31 — End: 2015-01-31
  Administered 2015-01-31: 750 mg via INTRAVENOUS
  Filled 2015-01-31: qty 150

## 2015-01-31 MED ORDER — LEVOFLOXACIN IN D5W 500 MG/100ML IV SOLN: 500.0000 mg | INTRAVENOUS | Status: DC

## 2015-01-31 MED ORDER — ALBUTEROL SULFATE (2.5 MG/3ML) 0.083% IN NEBU: 2.5000 mg | INHALATION_SOLUTION | RESPIRATORY_TRACT | Status: DC | PRN

## 2015-01-31 NOTE — Progress Notes (Signed)
[facility] Progress Note  Assessment/Plan: 1. Acute brainstem (pons) infarct with history of chronic lacunar infracts - work up per Neuro - so far ^^ Chol,^^^ TG - started statin,  Being evaluated for inpt rehab vs home with family- family refusing NHP- EEG was normal 2. ESRD - TTS via AVF -  S/p fistula declot on 3/31 followed by  HD late. Next HD planned for Tuesday. 3.  Hypertension/volume - CXR NAD on admission - BP good - volume is not an issue 4. Anemia - Hgb down to 10's -added ESA 5. Metabolic bone disease - Continue calcitriol at current dose - P up - have changed binder to velphoro due to size of pills  - D 3 diet could be higher in P 6. Nutrition - D3 diet /thin liquids+ renavite 7. DM - on glucotrol as an outpt - per primary - BS 160 - 250 on SSI 8. Hypothyroidism - TSH -18 -synthroid started 9. Disposition - Evaluated by rehab - may have support problems after d/c - has not been accepted yet- this is likely to be an issue  10. llq pain- better after BM   Maat Kafer A   01/31/2015,9:21 AM  LOS: 8 days       Subjective:  HD yest- no issues- removed 1100 -   Objective Filed Vitals:   01/30/15 1724 01/30/15 2137 01/31/15 0249 01/31/15 0658  BP: 122/57 102/66 139/62 129/60  Pulse: 88 93 68 77  Temp: 98.7 F (37.1 C) 100.2 F (37.9 C) 98.9 F (37.2 C) 98.9 F (37.2 C)  TempSrc: Oral Oral Oral Oral  Resp: 20 20 18 18   Height:      Weight:      SpO2: 98% 92% 99% 99%   Physical Exam General: supine ,  resting Heart: RRR Lungs: no rales Abdomen: soft NT Extremities: SCDs no overt edema Dialysis Access: right upper AVF patent   Dialysis Orders: [facility] 4 hr EDW 60.5 180 440/A 1.5 2K 2 Ca AVF no profile Mircera 50 q 4 weeks heparin 3500 calcitriol 0.25 - recently lowered from 0.75 due to ^ corr Ca iPTH 639 3/24 hgb 11.4 17%sat 3/24 - ferritin 1473 10/2014  Additional Objective Labs: Basic Metabolic Panel:  Recent Labs Lab  01/25/15 0830 01/26/15 0554 01/27/15 0539  01/28/15 1920 01/29/15 0139 01/30/15 0553  NA 134* 135 136  < > 132* 136 135  K 5.9* 5.3* 6.2*  < > >7.5* 5.0 3.7  CL 101 97 99  < > 94* 96 95*  CO2 17* 26 22  < > 20 22 29   GLUCOSE 160* 142* 162*  < > 240* 146* 127*  BUN 74* 33* 52*  < > 53* 57* 38*  CREATININE 9.73* 6.25* 8.19*  < > 8.51* 9.05* 7.50*  CALCIUM 9.0 9.2 9.4  < > 9.3 9.0 8.6  PHOS 6.1* 5.7* 6.9*  --   --   --   --   < > = values in this interval not displayed. Liver Function Tests:  Recent Labs Lab 01/26/15 0554 01/27/15 0539 01/27/15 1917  AST  --   --  37  ALT  --   --  27  ALKPHOS  --   --  90  BILITOT  --   --  0.6  PROT  --   --  8.3  ALBUMIN 3.4* 3.6 3.6    Recent Labs Lab 01/27/15 1917  LIPASE 37   CBC:  Recent Labs Lab 01/25/15 0830 01/26/15  1914 01/27/15 0539 01/28/15 0638  WBC 10.9* 11.9* 12.5* 16.2*  HGB 11.1* 11.9* 11.6* 10.3*  HCT 34.0* 36.6 36.7 32.0*  MCV 95.2 97.1 98.4 97.3  PLT 477* 424* 463* 450*   Blood Culture    Component Value Date/Time   SDES URINE, RANDOM 01/25/2015 0337   SPECREQUEST NONE 01/25/2015 0337   CULT  01/25/2015 0337    INSIGNIFICANT GROWTH Performed at [facility]    REPTSTATUS 01/26/2015 FINAL 01/25/2015 0337    Cardiac Enzymes: No results for input(s): CKTOTAL, CKMB, CKMBINDEX, TROPONINI in the last 168 hours. CBG:  Recent Labs Lab 01/30/15 0604 01/30/15 1312 01/30/15 1629 01/30/15 2145 01/31/15 0702  GLUCAP 121* 104* 196* 189* 140*  Medications:   . atorvastatin  80 mg Oral q1800  . calcitRIOL  0.25 mcg Oral Q M,W,F-HD  . clopidogrel  75 mg Oral Daily  . darbepoetin (ARANESP) injection - DIALYSIS  60 mcg Intravenous Q Sat-HD  . fenofibrate  160 mg Oral Daily  . insulin aspart  0-5 Units Subcutaneous QHS  . insulin aspart  0-9 Units Subcutaneous TID WC  . insulin glargine  5 Units Subcutaneous QHS  . levothyroxine  25 mcg Oral QAC breakfast  . multivitamin  1 tablet Oral QHS   . sodium chloride  3 mL Intravenous Q12H  . sucroferric oxyhydroxide  500 mg Oral TID WC

## 2015-01-31 NOTE — Progress Notes (Addendum)
ANTIBIOTIC CONSULT NOTE - INITIAL  Pharmacy Consult for Levaquin Indication: Bronchitis  No Known Allergies  Patient Measurements: Height: 5\' 1"  (154.9 cm) Weight: 130 lb 1.1 oz (59 kg) IBW/kg (Calculated) : 47.8  Vital Signs: Temp: 98.6 F (37 C) (04/03 1345) Temp Source: Oral (04/03 1345) BP: 145/72 mmHg (04/03 1345) Pulse Rate: 80 (04/03 1345) Intake/Output from previous day: 04/02 0701 - 04/03 0700 In: -  Out: 1170  Intake/Output from this shift: Total I/O In: 600 [P.O.:600] Out: -   Labs:  Recent Labs  01/29/15 0139 01/30/15 0553 01/31/15 0907  WBC  --   --  16.0*  HGB  --   --  9.5*  PLT  --   --  513*  CREATININE 9.05* 7.50* 5.95*   Estimated Creatinine Clearance: 10 mL/min (by C-G formula based on Cr of 5.95).  Microbiology: Recent Results (from the past 720 hour(s))  Culture, Urine     Status: None   Collection Time: 01/25/15  3:37 AM  Result Value Ref Range Status   Specimen Description URINE, RANDOM  Final   Special Requests NONE  Final   Colony Count   Final    9,000 COLONIES/ML [facility]formed at [facility]    Culture   Final    INSIGNIFICANT GROWTH Performed at [facility]    Report Status 01/26/2015 FINAL  Final   Medical History: Past Medical History  Diagnosis Date  . Hypertension   . Diabetes mellitus without complication   . Hyperlipidemia 01/24/2015  . ESRD on hemodialysis started 02/25/2013    [facility] -   . Anemia of chronic disease   . Secondary hyperparathyroidism    Assessment: 44yo female with multiple medical problems who has some bronchitis and possible UTI.  We have been asked to dose Levaquin for her.  She has ESRD with HD on TThS.  She has a WBC of 16K, a mildly elevated PCT at 0.71.  Goal of Therapy:  Therapeutic response to IV antibiotics  Plan:  - Levaquin 750mg  IV x 1 - Levaquin 500mg  every 48 hours - Monitor s/s of infection, clinical response  - LOT  Nadara MustardNita Johnston, PharmD., MS Clinical  Pharmacist Pager:  [phone] Thank you for allowing pharmacy to be part of this patients care team. 01/31/2015,6:58 PM   =================   Addendum: - Pharmacy will sign off as dosage will not change for ESRD status.   Bernadett Milian D. Laney Potashang, PharmD, BCPS Pager:  [phone] 02/01/2015, 9:38 AM

## 2015-01-31 NOTE — Progress Notes (Signed)
[facility] PROGRESS NOTE  Deon Ivey ZOX:[id number] DOB: [birth year] DOA: 01/23/2015 PCP: No primary care provider on file. INTERIM SUMMARY:  44 year old LADY WITH h/o hypertension , DM, ESRD on HD, mild MR comes in for speech difficulties and nausea. She was found to have acute brain stem infarct. Therapy evals orderd and recommended CIR. Currently awaiting CIR recommendations. Patient seen with the interpretor at bedside. Reports some cough and congestion.  Assessment/Plan: #1 acute brainstem infarct. Left paracentral troponins lacunar with no mass effect or hemorrhage Patient  noted to have chronic lacunar infarcts of the right pons and left basal ganglia/corona radiata.  Carotid Dopplers with no significant ICA stenosis from preliminary reading. 2-D echo reviewed showed LVEF OF 60 TO 65% with no regional wall abnormalities, doppler parameters are consistent with abnormal left ventricular relaxation. Unable to calculate LDL secondary to triglycerides of 526. Hemoglobin A1c is 7.8. Continue statin and TriCor. Aspirin changed to plavix for secondary stroke prevention. PT/OT/ST evals done and recommending inpatient rehabilitation. Neurology following and appreciate input and recommendations.  currently awaiting to hear from inpatient rehabilitation for bed on monday.   #2 hypotension: Resolved.   #3 hypothyroidism TSH elevated at 18.051. Continue Synthroid 25 g daily. Will need outpatient follow-up with thyroid function studies repeated in 4-6 weeks.  #4 bacteria in urine Urine cultures with insignificant growth. IV antibiotics are discontinued.   #5 diabetes mellitus Hemoglobin A1c = 7.8.  CBG (last 3)   Recent Labs  01/31/15 0702 01/31/15 1029 01/31/15 1635  GLUCAP 140* 298* 105*    On SSI and low dose lantus.   #6 end-stage renal disease on hemodialysis Tuesdays Thursdays Saturdays Patient for HD on TTS  #7 hyperkalemia RESOLVED. Repeat potassium is 4.2     #8 Hyperlipidemia Cholesterol = 305, TG 526. Continue statin and tricor.  #9 leukocytosis Likely reactive leukocytosis. Urine cultures with insignificant growth. Chest x-ray negative for any acute infiltrates. procalcitonin levels are 0.67 and 0.71.  #10 prophylaxis SCDs for DVT prophylaxis.   #11 nausea , vomiting and an episode of unresponsiveness on 3/31: Probably from vaso vagal effect from orthostatic hypotension post HD. She was given a bolus of 250 ml of NS. And she became awake and alert and oriented a few min after the episode. The episode happened when she got up from the sitting position after her BM. Liver panel normal. Lipase is normal. CXR negative for aspiration. EEG ordered to evaluate for seizures and was negative .   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 Reestablished flow in the right upper arm fistula.    #13 persistent abdominal discomfort: CT abd and pelvis ordered and is negative for acute pathology.  Continue to monitor.  Her pain is much better after the BM.    #14 cough and congestion: CXR ordered, her t max is 100.2, her CXR shows bronchitic, no pneumonia or edema. Will order levaquin for bronchitis.       Code Status: Full Family Communication: discussed with family at bedside.  Disposition Plan: CIR when bed available.   Consultants:  Nephrology: Dr. Arlean Hopping 01/24/2015  Neurology: Dr. Amada Jupiter 01/23/2015  Procedures:  CT head 01/23/2015  Chest x-ray 01/23/2015  MRI head 01/23/2015  Carotid dopplers 01/24/15  2 d echo 01/26/2015  Antibiotics:  Oral Cipro 01/23/15 >>>01/24/15  IV Rocephin 01/24/15>>>> 01/26/2015  HPI/Subjective: Cough and congestion.   Objective: Filed Vitals:   01/31/15 1345  BP: 145/72  Pulse: 80  Temp: 98.6 F (37 C)  Resp: 18    Intake/Output Summary (Last 24 hours) at 01/31/15  1849 Last data filed at 01/31/15 1500  Gross per 24 hour  Intake    600 ml  Output      0 ml  Net    600 ml   Filed Weights   01/29/15 0426 01/30/15 0817 01/30/15 1230  Weight: 61.6 kg (135 lb 12.9 oz) 60 kg (132 lb 4.4 oz) 59 kg (130 lb 1.1 oz)    Exam:   General:  Comfortable.   Cardiovascular: RRR  Respiratory: CTAB scattered wheezing posteriorly.   Abdomen: Soft, nontender, nondistended, positive bowel sounds.  Musculoskeletal: No clubbing cyanosis or edema.  Data Reviewed: Basic Metabolic Panel:  Recent Labs Lab 01/25/15 0830 01/26/15 0554 01/27/15 0539  01/28/15 [id number] 01/28/15 1920 01/29/15 0139 01/30/15 0553 01/31/15 0907  NA 134* 135 136  < > 135 132* 136 135 134*  K 5.9* 5.3* 6.2*  < > 6.0* >7.5* 5.0 3.7 4.2  CL 101 97 99  < > 97 94* 96 95* 91*  CO2 17* 26 22  < > 24 20 22 29 28   GLUCOSE 160* 142* 162*  < > 143* 240* 146* 127* 181*  BUN 74* 33* 52*  < > 40* 53* 57* 38* 25*  CREATININE 9.73* 6.25* 8.19*  < > 7.22* 8.51* 9.05* 7.50* 5.95*  CALCIUM 9.0 9.2 9.4  < > 9.7 9.3 9.0 8.6 9.7  PHOS 6.1* 5.7* 6.9*  --   --   --   --   --   --   < > = values in this interval not displayed. Liver Function Tests:  Recent Labs Lab 01/25/15 0830 01/26/15 0554 01/27/15 0539 01/27/15 1917  AST  --   --   --  37  ALT  --   --   --  27  ALKPHOS  --   --   --  90  BILITOT  --   --   --  0.6  PROT  --   --   --  8.3  ALBUMIN 3.1* 3.4* 3.6 3.6    Recent Labs Lab 01/27/15 1917  LIPASE 37   No results for input(s): AMMONIA in the last 168 hours. CBC:  Recent Labs Lab 01/25/15 0830 01/26/15 0554 01/27/15 0539 01/28/15 0638 01/31/15 0907  WBC 10.9* 11.9* 12.5* 16.2* 16.0*  HGB 11.1* 11.9* 11.6* 10.3* 9.5*  HCT 34.0* 36.6 36.7 32.0* 29.9*  MCV 95.2 97.1 98.4 97.3 97.7  PLT 477* 424* 463* 450* 513*   Cardiac Enzymes: No results for input(s): CKTOTAL, CKMB, CKMBINDEX, TROPONINI in the last 168 hours. BNP (last 3 results) No results for input(s): BNP in  the last 8760 hours.  ProBNP (last 3 results) No results for input(s): PROBNP in the last 8760 hours.  CBG:  Recent Labs Lab 01/30/15 1629 01/30/15 2145 01/31/15 0702 01/31/15 1029 01/31/15 1635  GLUCAP 196* 189* 140* 298* 105*    Recent Results (from the past 240 hour(s))  Culture, Urine     Status: None   Collection Time: 01/25/15  3:37 AM  Result Value Ref Range Status   Specimen Description URINE, RANDOM  Final   Special Requests NONE  Final   Colony Count   Final    9,000 COLONIES/ML Performed at [facility]    Culture   Final    INSIGNIFICANT GROWTH Performed at [facility]  Report Status 01/26/2015 FINAL  Final     Studies: Dg Chest 2 View  01/31/2015   CLINICAL DATA:  Cough. History of hypertension and diabetes. Initial encounter.  EXAM: CHEST  2 VIEW  COMPARISON:  Radiographs 01/23/2015 and 01/27/2015. Abdominal CT 01/29/2015.  FINDINGS: The heart size and mediastinal contours appear stable. There is mild central airway thickening without hyperinflation, confluent airspace opacity or pleural effusion. Contrast material is noted within the colon from the recent CT. The bones appear unremarkable. Telemetry leads overlie the chest.  IMPRESSION: Mild central airway thickening suggesting bronchitis. No evidence of pneumonia or edema.   Electronically Signed   By: Carey Bullocks M.D.   On: 01/31/2015 14:53    Scheduled Meds: . atorvastatin  80 mg Oral q1800  . calcitRIOL  0.25 mcg Oral Q M,W,F-HD  . clopidogrel  75 mg Oral Daily  . darbepoetin (ARANESP) injection - DIALYSIS  60 mcg Intravenous Q Sat-HD  . fenofibrate  160 mg Oral Daily  . insulin aspart  0-5 Units Subcutaneous QHS  . insulin aspart  0-9 Units Subcutaneous TID WC  . insulin glargine  5 Units Subcutaneous QHS  . levothyroxine  25 mcg Oral QAC breakfast  . multivitamin  1 tablet Oral QHS  . sodium chloride  3 mL Intravenous Q12H  . sucroferric oxyhydroxide  500 mg Oral TID WC    Continuous Infusions:   Principal Problem:   Brainstem stroke Active Problems:   ESRD (end stage renal disease)   Diabetes   CVA (cerebral infarction)   Life long cognitive dysfunction   Hypothyroidism   Hyperlipidemia   Anemia   Hyperkalemia   Leukocytosis   UTI (urinary tract infection)   Weakness    Time spent: 25 minutes    Alvena Kiernan MD [facility] Pager [phone]. If 7PM-7AM, please contact night-coverage at [email or web address], password [email or web address] 01/31/2015, 6:49 PM  LOS: 8 days

## 2015-02-01 ENCOUNTER — Inpatient Hospital Stay (HOSPITAL_COMMUNITY)
Admission: RE | Admit: 2015-02-01 | Discharge: 2015-02-11 | DRG: 064 | Disposition: A | Discharge status: Home / Self Care | Payer: Self-pay | Source: Intra-hospital | Attending: Physical Medicine & Rehabilitation | Admitting: Physical Medicine & Rehabilitation

## 2015-02-01 DIAGNOSIS — N186 End stage renal disease: Secondary | ICD-10-CM | POA: Diagnosis present

## 2015-02-01 DIAGNOSIS — N2581 Secondary hyperparathyroidism of renal origin: Secondary | ICD-10-CM | POA: Diagnosis present

## 2015-02-01 DIAGNOSIS — D638 Anemia in other chronic diseases classified elsewhere: Secondary | ICD-10-CM | POA: Diagnosis present

## 2015-02-01 DIAGNOSIS — Z992 Dependence on renal dialysis: Secondary | ICD-10-CM

## 2015-02-01 DIAGNOSIS — N390 Urinary tract infection, site not specified: Secondary | ICD-10-CM | POA: Diagnosis not present

## 2015-02-01 DIAGNOSIS — I6350 Cerebral infarction due to unspecified occlusion or stenosis of unspecified cerebral artery: Secondary | ICD-10-CM

## 2015-02-01 DIAGNOSIS — R1310 Dysphagia, unspecified: Secondary | ICD-10-CM | POA: Diagnosis present

## 2015-02-01 DIAGNOSIS — G8191 Hemiplegia, unspecified affecting right dominant side: Secondary | ICD-10-CM | POA: Diagnosis present

## 2015-02-01 DIAGNOSIS — E1165 Type 2 diabetes mellitus with hyperglycemia: Secondary | ICD-10-CM | POA: Diagnosis present

## 2015-02-01 DIAGNOSIS — I639 Cerebral infarction, unspecified: Principal | ICD-10-CM | POA: Diagnosis present

## 2015-02-01 DIAGNOSIS — J209 Acute bronchitis, unspecified: Secondary | ICD-10-CM | POA: Diagnosis present

## 2015-02-01 DIAGNOSIS — I120 Hypertensive chronic kidney disease with stage 5 chronic kidney disease or end stage renal disease: Secondary | ICD-10-CM | POA: Diagnosis present

## 2015-02-01 DIAGNOSIS — D649 Anemia, unspecified: Secondary | ICD-10-CM | POA: Diagnosis present

## 2015-02-01 DIAGNOSIS — E039 Hypothyroidism, unspecified: Secondary | ICD-10-CM | POA: Diagnosis present

## 2015-02-01 DIAGNOSIS — E119 Type 2 diabetes mellitus without complications: Secondary | ICD-10-CM

## 2015-02-01 LAB — CBC
HCT: 27 % — ABNORMAL LOW (ref 36.0–46.0)
HCT: 28.6 % — ABNORMAL LOW (ref 36.0–46.0)
HEMOGLOBIN: 8.8 g/dL — AB (ref 12.0–15.0)
Hemoglobin: 9.4 g/dL — ABNORMAL LOW (ref 12.0–15.0)
MCH: 30.9 pg (ref 26.0–34.0)
MCH: 31.5 pg (ref 26.0–34.0)
MCHC: 32.6 g/dL (ref 30.0–36.0)
MCHC: 32.9 g/dL (ref 30.0–36.0)
MCV: 94.7 fL (ref 78.0–100.0)
MCV: 96 fL (ref 78.0–100.0)
PLATELETS: 571 10*3/uL — AB (ref 150–400)
Platelets: 562 10*3/uL — ABNORMAL HIGH (ref 150–400)
RBC: 2.85 MIL/uL — ABNORMAL LOW (ref 3.87–5.11)
RBC: 2.98 MIL/uL — AB (ref 3.87–5.11)
RDW: 15.9 % — ABNORMAL HIGH (ref 11.5–15.5)
RDW: 16 % — ABNORMAL HIGH (ref 11.5–15.5)
WBC: 14.6 10*3/uL — ABNORMAL HIGH (ref 4.0–10.5)
WBC: 15.2 10*3/uL — AB (ref 4.0–10.5)

## 2015-02-01 LAB — GLUCOSE, CAPILLARY
GLUCOSE-CAPILLARY: 117 mg/dL — AB (ref 70–99)
GLUCOSE-CAPILLARY: 153 mg/dL — AB (ref 70–99)
Glucose-Capillary: 205 mg/dL — ABNORMAL HIGH (ref 70–99)
Glucose-Capillary: 231 mg/dL — ABNORMAL HIGH (ref 70–99)

## 2015-02-01 LAB — PROCALCITONIN: PROCALCITONIN: 1.21 ng/mL

## 2015-02-01 MED ORDER — CALCIUM CARBONATE 1250 MG/5ML PO SUSP: 500.0000 mg | Freq: Four times a day (QID) | ORAL | Status: DC | PRN

## 2015-02-01 MED ORDER — INSULIN ASPART 100 UNIT/ML ~~LOC~~ SOLN: 0.0000 [IU] | Freq: Three times a day (TID) | SUBCUTANEOUS | Status: DC

## 2015-02-01 MED ORDER — CLOPIDOGREL BISULFATE 75 MG PO TABS
75.0000 mg | ORAL_TABLET | Freq: Every day | ORAL | Status: DC
  Administered 2015-02-02 – 2015-02-10 (×9): 75 mg via ORAL
  Filled 2015-02-01 (×12): qty 1

## 2015-02-01 MED ORDER — INSULIN GLARGINE 100 UNIT/ML ~~LOC~~ SOLN: 5.0000 [IU] | Freq: Every day | SUBCUTANEOUS | Status: DC

## 2015-02-01 MED ORDER — RENA-VITE PO TABS: 1.0000 | ORAL_TABLET | Freq: Every day | ORAL | Status: DC

## 2015-02-01 MED ORDER — SODIUM CHLORIDE 0.9 % IJ SOLN: 3.0000 mL | INTRAMUSCULAR | Status: DC | PRN

## 2015-02-01 MED ORDER — FENOFIBRATE 160 MG PO TABS: 160.0000 mg | ORAL_TABLET | Freq: Every day | ORAL | Status: DC

## 2015-02-01 MED ORDER — FENOFIBRATE 160 MG PO TABS
160.0000 mg | ORAL_TABLET | Freq: Every day | ORAL | Status: DC
  Administered 2015-02-02 – 2015-02-10 (×9): 160 mg via ORAL
  Filled 2015-02-01 (×12): qty 1

## 2015-02-01 MED ORDER — CALCIUM CARBONATE 1250 MG/5ML PO SUSP
500.0000 mg | Freq: Four times a day (QID) | ORAL | Status: DC | PRN
  Filled 2015-02-01: qty 5

## 2015-02-01 MED ORDER — GUAIFENESIN-DM 100-10 MG/5ML PO SYRP
5.0000 mL | ORAL_SOLUTION | Freq: Four times a day (QID) | ORAL | Status: DC | PRN
Start: 2015-02-01 — End: 2015-02-11

## 2015-02-01 MED ORDER — ZOLPIDEM TARTRATE 5 MG PO TABS
5.0000 mg | ORAL_TABLET | Freq: Every evening | ORAL | Status: DC | PRN
  Administered 2015-02-01 – 2015-02-10 (×3): 5 mg via ORAL
  Filled 2015-02-01 (×3): qty 1

## 2015-02-01 MED ORDER — ONDANSETRON HCL 4 MG PO TABS
4.0000 mg | ORAL_TABLET | Freq: Four times a day (QID) | ORAL | Status: DC | PRN
  Administered 2015-02-03 – 2015-02-09 (×2): 4 mg via ORAL
  Filled 2015-02-01 (×2): qty 1

## 2015-02-01 MED ORDER — SUCROFERRIC OXYHYDROXIDE 500 MG PO CHEW
500.0000 mg | CHEWABLE_TABLET | Freq: Three times a day (TID) | ORAL | Status: DC
  Administered 2015-02-01 – 2015-02-11 (×25): 500 mg via ORAL
  Filled 2015-02-01 (×32): qty 1

## 2015-02-01 MED ORDER — INSULIN ASPART 100 UNIT/ML ~~LOC~~ SOLN
0.0000 [IU] | Freq: Three times a day (TID) | SUBCUTANEOUS | Status: DC
  Administered 2015-02-01: 2 [IU] via SUBCUTANEOUS
  Administered 2015-02-02 (×2): 1 [IU] via SUBCUTANEOUS
  Administered 2015-02-02: 2 [IU] via SUBCUTANEOUS
  Administered 2015-02-05: 1 [IU] via SUBCUTANEOUS
  Administered 2015-02-05: 3 [IU] via SUBCUTANEOUS
  Administered 2015-02-07 (×3): 1 [IU] via SUBCUTANEOUS
  Administered 2015-02-08: 2 [IU] via SUBCUTANEOUS
  Administered 2015-02-08 – 2015-02-10 (×3): 1 [IU] via SUBCUTANEOUS
  Administered 2015-02-10 – 2015-02-11 (×2): 2 [IU] via SUBCUTANEOUS

## 2015-02-01 MED ORDER — ONDANSETRON HCL 4 MG/2ML IJ SOLN: 4.0000 mg | Freq: Four times a day (QID) | INTRAMUSCULAR | Status: DC | PRN

## 2015-02-01 MED ORDER — GUAIFENESIN 100 MG/5ML PO SYRP
200.0000 mg | ORAL_SOLUTION | ORAL | Status: DC | PRN
  Administered 2015-02-01: 200 mg via ORAL
  Filled 2015-02-01 (×2): qty 10

## 2015-02-01 MED ORDER — ATORVASTATIN CALCIUM 80 MG PO TABS: 80.0000 mg | ORAL_TABLET | Freq: Every day | ORAL | Status: DC

## 2015-02-01 MED ORDER — LEVOTHYROXINE SODIUM 25 MCG PO TABS
25.0000 ug | ORAL_TABLET | Freq: Every day | ORAL | Status: DC
  Administered 2015-02-02 – 2015-02-05 (×4): 25 ug via ORAL
  Filled 2015-02-01 (×5): qty 1

## 2015-02-01 MED ORDER — ACETAMINOPHEN 325 MG PO TABS
325.0000 mg | ORAL_TABLET | ORAL | Status: DC | PRN
  Administered 2015-02-01 – 2015-02-10 (×4): 650 mg via ORAL
  Filled 2015-02-01 (×4): qty 2

## 2015-02-01 MED ORDER — DARBEPOETIN ALFA 60 MCG/0.3ML IJ SOSY
60.0000 ug | PREFILLED_SYRINGE | INTRAMUSCULAR | Status: DC
  Filled 2015-02-01: qty 0.3

## 2015-02-01 MED ORDER — INSULIN ASPART 100 UNIT/ML ~~LOC~~ SOLN
0.0000 [IU] | Freq: Every day | SUBCUTANEOUS | Status: DC
  Administered 2015-02-01 – 2015-02-10 (×4): 2 [IU] via SUBCUTANEOUS

## 2015-02-01 MED ORDER — CAMPHOR-MENTHOL 0.5-0.5 % EX LOTN
1.0000 "application " | TOPICAL_LOTION | Freq: Three times a day (TID) | CUTANEOUS | Status: DC | PRN
  Filled 2015-02-01: qty 222

## 2015-02-01 MED ORDER — DOCUSATE SODIUM 100 MG PO CAPS
100.0000 mg | ORAL_CAPSULE | Freq: Two times a day (BID) | ORAL | Status: DC
  Administered 2015-02-01 – 2015-02-07 (×12): 100 mg via ORAL
  Filled 2015-02-01 (×14): qty 1

## 2015-02-01 MED ORDER — ALBUTEROL SULFATE (2.5 MG/3ML) 0.083% IN NEBU: 2.5000 mg | INHALATION_SOLUTION | RESPIRATORY_TRACT | Status: DC | PRN

## 2015-02-01 MED ORDER — HYDROXYZINE HCL 25 MG PO TABS
25.0000 mg | ORAL_TABLET | Freq: Three times a day (TID) | ORAL | Status: DC | PRN
  Filled 2015-02-01: qty 1

## 2015-02-01 MED ORDER — RENA-VITE PO TABS
1.0000 | ORAL_TABLET | Freq: Every day | ORAL | Status: DC
  Administered 2015-02-01 – 2015-02-10 (×10): 1 via ORAL
  Filled 2015-02-01 (×11): qty 1

## 2015-02-01 MED ORDER — SORBITOL 70 % SOLN
30.0000 mL | Status: DC | PRN
  Administered 2015-02-06: 30 mL via ORAL
  Filled 2015-02-01: qty 30

## 2015-02-01 MED ORDER — LEVOFLOXACIN 500 MG PO TABS
500.0000 mg | ORAL_TABLET | ORAL | Status: DC
  Administered 2015-02-02 – 2015-02-04 (×2): 500 mg via ORAL
  Filled 2015-02-01 (×3): qty 1

## 2015-02-01 MED ORDER — MENTHOL 3 MG MT LOZG
1.0000 | LOZENGE | OROMUCOSAL | Status: DC | PRN
  Filled 2015-02-01: qty 9

## 2015-02-01 MED ORDER — ALUMINUM HYDROXIDE GEL 320 MG/5ML PO SUSP
15.0000 mL | Freq: Four times a day (QID) | ORAL | Status: DC | PRN
  Filled 2015-02-01: qty 30

## 2015-02-01 MED ORDER — LEVOTHYROXINE SODIUM 25 MCG PO TABS: 25.0000 ug | ORAL_TABLET | Freq: Every day | ORAL | Status: DC

## 2015-02-01 MED ORDER — DOCUSATE SODIUM 283 MG RE ENEM
1.0000 | ENEMA | RECTAL | Status: DC | PRN
  Filled 2015-02-01: qty 1

## 2015-02-01 MED ORDER — CLOPIDOGREL BISULFATE 75 MG PO TABS: 75.0000 mg | ORAL_TABLET | Freq: Every day | ORAL | Status: DC

## 2015-02-01 MED ORDER — SUCROFERRIC OXYHYDROXIDE 500 MG PO CHEW: 500.0000 mg | CHEWABLE_TABLET | Freq: Three times a day (TID) | ORAL | Status: DC

## 2015-02-01 MED ORDER — LEVOFLOXACIN IN D5W 500 MG/100ML IV SOLN: 500.0000 mg | INTRAVENOUS | Status: DC

## 2015-02-01 MED ORDER — ENOXAPARIN SODIUM 30 MG/0.3ML ~~LOC~~ SOLN
30.0000 mg | SUBCUTANEOUS | Status: DC
  Administered 2015-02-01 – 2015-02-10 (×10): 30 mg via SUBCUTANEOUS
  Filled 2015-02-01 (×11): qty 0.3

## 2015-02-01 MED ORDER — CALCITRIOL 0.25 MCG PO CAPS
0.2500 ug | ORAL_CAPSULE | ORAL | Status: DC
  Administered 2015-02-03 – 2015-02-10 (×4): 0.25 ug via ORAL
  Filled 2015-02-01 (×4): qty 1

## 2015-02-01 MED ORDER — INSULIN GLARGINE 100 UNIT/ML ~~LOC~~ SOLN
5.0000 [IU] | Freq: Every day | SUBCUTANEOUS | Status: DC
  Administered 2015-02-01: 5 [IU] via SUBCUTANEOUS
  Filled 2015-02-01 (×2): qty 0.05

## 2015-02-01 MED ORDER — LEVOFLOXACIN 500 MG PO TABS: 500.0000 mg | ORAL_TABLET | ORAL | Status: DC

## 2015-02-01 MED ORDER — ATORVASTATIN CALCIUM 80 MG PO TABS
80.0000 mg | ORAL_TABLET | Freq: Every day | ORAL | Status: DC
  Administered 2015-02-01 – 2015-02-10 (×10): 80 mg via ORAL
  Filled 2015-02-01 (×11): qty 1

## 2015-02-01 NOTE — Progress Notes (Signed)
Speech Language Pathology Treatment: Dysphagia;Cognitive-Linquistic  Patient Details Name: Natalie [name] MRN: [id number] DOB: 08-08-1971 Today's Date: 02/01/2015 Time: [id number] SLP Time Calculation (min) (ACUTE ONLY): 18 min  Assessment / Plan / Recommendation Clinical Impression  Follow-up for dysphagia and dysarthria treatment. Pt was directly observed with solid and thin liquid consistencies with no overt s/s of aspiration. Dysarthria treatment focused on improving intelligibility. Practiced with patient overarticulation and increased volumen during speech. Practiced at word, phrase and sentence level. Pt able to improve function but required moderate verbal and visual cues to overarticulate lips and use increased volume. Left sign in room with strategies to remind family as pt is illiterate. Recommend pt continue dys 3/ thin liquid diet given pt's need for assistance with feeding. Speech will follow-up with continued dysarthria treatment.    HPI HPI: Pt is a 44 y.o. female admitted with dysarthria, trouble swallowing, and right sided weakness. 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  Medical history significant for HTN, diabetes, end stage renal disease, and intellectual disability.   Pt with a history of low literacy, no education and is non [place] speaking.   Pertinent Vitals    SLP Plan  Continue with current plan of care    Recommendations Diet recommendations: Dysphagia 3 (mechanical soft);Thin liquid Liquids provided via: Cup;Straw Medication Administration: Whole meds with puree Supervision: Full supervision/cueing for compensatory strategies Compensations: Slow rate;Small sips/bites;Check for pocketing Postural Changes and/or Swallow Maneuvers: Upright 30-60 min after meal;Seated upright 90 degrees              Oral Care Recommendations: Oral care BID Follow up Recommendations:  Inpatient Rehab Plan: Continue with current plan of care    GO     Bermudez-Bosch, Khrystina Bonnes 02/01/2015, 11:00 AM

## 2015-02-01 NOTE — Progress Notes (Signed)
Discussed with Dr. Blake DivineAkula. Inpt rehab bed is available for pt to be admitted to today and I will make the arrangements. I have notified pt's niece, Kathrine CordsMirna, and she is aware and in agreement. I will meet with [name] at bedside with pt at 3 pm to complete paperwork and answer any questions they may have. [phone]

## 2015-02-01 NOTE — H&P (Signed)
Physical Medicine and Rehabilitation Admission H&P   Chief Complaint  Patient presents with  . Right sided weakness, decreased coordination, difficulty swallowing.     HPI: Natalie [name] is a 44 y.o. [place] speaking female with history of hypertension,mild MR, ESRD-HD TTS who was admitted on 01/23/2015 with difficulty swallowing with drooling, dysarthria, R-sided weakness with increased difficulty walking. MRI of the brain showed acute brainstem infarct, left paracentral pons lacune with no mass effect as well as chronic lacunar infarcts of the right pons and left basal ganglia corona radiata. Carotid Dopplers without significant ICA stenosis. 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 She was noted to have leucocytosis likely due to UTI and was placed on Cipro for treatment. TSH elevated and synthroid added for supplement. Swallow evaluation done and patient placed on dysphagia 3 diet with nectar liquids. Cognitive evaluation reveals patient at baseline. Dr. Erlinda Hong recommended ASA for left paracentral stroke due to small vessel disease. Patient has had ongoing issues with abdominal pain and CT abdomen/pelvis 4/1 revealed hepatic steatosis and bilateral renal atrophy--no gross abnormality. Abdominal pain improved past treatment of constipation. Patient has had reactive leucocytosis with cough, fever and elevated procalcitonin due to bronchitis and was started on Levaquin for treatment yesterday. Patient with right sided weakness with foot drop affecting safety with mobility as well as impusivity. CIR recommended by MD and Rehab team.    Review of Systems  Respiratory: Positive for cough.  Cardiovascular: Negative for chest pain.  Musculoskeletal: Positive for myalgias. Negative for back pain.  Neurological: Positive for headaches.      Past Medical History  Diagnosis Date  .  Hypertension   . Diabetes mellitus without complication   . Hyperlipidemia 01/24/2015  . ESRD on hemodialysis started 02/25/2013    [facility] -   . Anemia of chronic disease   . Secondary hyperparathyroidism     Past Surgical History  Procedure Laterality Date  . Insertion of dialysis catheter      History reviewed. No pertinent family history.    Social History:  reports that she has never smoked. She does not have any smokeless tobacco history on file. She reports that she does not drink alcohol or use illicit drugs.    Allergies: No Known Allergies    Medications Prior to Admission  Medication Sig Dispense Refill  . amLODipine (NORVASC) 10 MG tablet Take 10 mg by mouth daily.    Marland Kitchen glipiZIDE (GLUCOTROL) 5 MG tablet Take 5 mg by mouth daily before breakfast.    . lisinopril (PRINIVIL,ZESTRIL) 40 MG tablet Take 40 mg by mouth at bedtime.    . sevelamer carbonate (RENVELA) 800 MG tablet Take 800 mg by mouth 2 (two) times daily with a meal.      Home: Home Living Family/patient expects to be discharged to:: Private residence Living Arrangements: Other relatives Available Help at Discharge: Family Type of Home: Mobile home Home Access: Stairs to enter Entrance Stairs-Number of Steps: (OT reports 8 steps, niece states 4-5) Entrance Stairs-Rails: Right Home Layout: One level Home Equipment: None Additional Comments: pt lives with sister who works during the day Lives With: Family (pt. lives with her sister Natalie [name] and Natalie [name])  Functional History: Prior Function Level of Independence: Independent Comments: reports getting a ride to dialysis.  Functional Status:  Mobility: Bed Mobility Overal bed mobility: Needs Assistance Bed Mobility: Rolling, Sidelying to Sit Rolling: Min assist Sidelying  to sit: Min assist, HOB elevated Supine to sit: Min assist Sit to supine: Min guard General bed mobility  comments: Min A for control and elevation trunk to EOB. Patient's niece assisted with bed mobility Transfers Overall transfer level: Needs assistance Equipment used: 2 person hand held assist Transfers: Sit to/from Stand Sit to Stand: Min assist, +2 safety/equipment Stand pivot transfers: Min guard General transfer comment: Min A for stability and balance. A to power up and translate weight anteriorly with stand.  Ambulation/Gait Ambulation/Gait assistance: Min assist, +2 physical assistance Ambulation Distance (Feet): 40 Feet Assistive device: 2 person hand held assist Gait Pattern/deviations: Drifts right/left, Staggering right, Staggering left, Scissoring, Ataxic, Shuffle Gait velocity interpretation: Below normal speed for age/gender General Gait Details: Patient ataxic and unstedy with gait this session. +2 min A for safety and stability. Decreased R foot clearance again this session. Limited by fatigue.     ADL: ADL Overall ADL's : Needs assistance/impaired Eating/Feeding: Minimal assistance, Sitting Grooming: Minimal assistance, Sitting Upper Body Bathing: Minimal assitance, Sitting Lower Body Bathing: Minimal assistance, Sit to/from stand Upper Body Dressing : Minimal assistance, Sitting Lower Body Dressing: Minimal assistance, Sit to/from stand Toilet Transfer: Minimal assistance, Ambulation, RW Toileting- Clothing Manipulation and Hygiene: Minimal assistance, Sit to/from stand Tub/ Shower Transfer: Minimal assistance, Ambulation, Rolling walker Functional mobility during ADLs: Minimal assistance, Rolling walker General ADL Comments: Patient with baseline cognitive impairments and speaks spanish as her first language. Patient with ataxic movements during functional mobility. Complaints of stomach cramps. Patient transferred onto elevated toilet seat for toileting needs. Then stood at sink for grooming tasks of washing hands and combing hair. Educated patient on AAROM exercises  > RUE to increase strength and functional use.   Cognition: Cognition Overall Cognitive Status: History of cognitive impairments - at baseline Arousal/Alertness: Awake/alert Orientation Level: Oriented to person, Oriented to place, Oriented to time Attention: Focused, Sustained Focused Attention: Appears intact Sustained Attention: Appears intact Memory: Appears intact (Similar to baseline) Awareness: Impaired Awareness Impairment: Emergent impairment, Anticipatory impairment Executive Function: Self Monitoring, Sequencing Sequencing: Appears intact Self Monitoring: Impaired Self Monitoring Impairment: Verbal basic Behaviors: Impulsive Safety/Judgment: Impaired Cognition Arousal/Alertness: Awake/alert Behavior During Therapy: WFL for tasks assessed/performed Overall Cognitive Status: History of cognitive impairments - at baseline Difficult to assess due to: Non-English speaking   Blood pressure 139/66, pulse 76, temperature 98.2 F (36.8 C), temperature source Oral, resp. rate 18, height 5' 1"  (1.549 m), weight 59 kg (130 lb 1.1 oz), last menstrual period 12/23/2014, SpO2 98 %. Physical Exam  Nursing note and vitals reviewed. Constitutional: She appears well-developed and well-nourished.  HENT:  Head: Normocephalic and atraumatic.  Eyes: Conjunctivae are normal. Pupils are equal, round, and reactive to light.  Neck: Normal range of motion. Neck supple.  Cardiovascular: Normal rate and regular rhythm.  Respiratory: Effort normal and breath sounds normal. No respiratory distress. She has no wheezes.  GI: Soft. Bowel sounds are normal. She exhibits no distension. There is no tenderness.  Musculoskeletal: She exhibits no edema or tenderness.  Neurological: She is alert. Seems to follow basic commands and understands my broken/limited spanish. LUE grossly 4/5. LLE 3hf, 4-ke and ankle. RUE 3 to 3+/5 prox to distal. RLE: 3-hf, 3ke, 3-apf/adf. --MMT somewhat limited by inconsistent  effort. Sensed pain in all 4's. No resting tone.  Skin: Skin is warm and dry.     Lab Results Last 48 Hours    Results for orders placed or performed during the hospital encounter of 01/23/15 (from the past  48 hour(s))  Glucose, capillary Status: Abnormal   Collection Time: 01/30/15 4:29 PM  Result Value Ref Range   Glucose-Capillary 196 (H) 70 - 99 mg/dL  Glucose, capillary Status: Abnormal   Collection Time: 01/30/15 9:45 PM  Result Value Ref Range   Glucose-Capillary 189 (H) 70 - 99 mg/dL   Comment 1 Notify RN    Comment 2 Document in Chart   Glucose, capillary Status: Abnormal   Collection Time: 01/31/15 7:02 AM  Result Value Ref Range   Glucose-Capillary 140 (H) 70 - 99 mg/dL   Comment 1 Notify RN    Comment 2 Document in Chart   CBC Status: Abnormal   Collection Time: 01/31/15 9:07 AM  Result Value Ref Range   WBC 16.0 (H) 4.0 - 10.5 K/uL   RBC 3.06 (L) 3.87 - 5.11 MIL/uL   Hemoglobin 9.5 (L) 12.0 - 15.0 g/dL   HCT 29.9 (L) 36.0 - 46.0 %   MCV 97.7 78.0 - 100.0 fL   MCH 31.0 26.0 - 34.0 pg   MCHC 31.8 30.0 - 36.0 g/dL   RDW 16.2 (H) 11.5 - 15.5 %   Platelets 513 (H) 150 - 400 K/uL  Basic metabolic panel Status: Abnormal   Collection Time: 01/31/15 9:07 AM  Result Value Ref Range   Sodium 134 (L) 135 - 145 mmol/L   Potassium 4.2 3.5 - 5.1 mmol/L   Chloride 91 (L) 96 - 112 mmol/L   CO2 28 19 - 32 mmol/L   Glucose, Bld 181 (H) 70 - 99 mg/dL   BUN 25 (H) 6 - 23 mg/dL   Creatinine, Ser 5.95 (H) 0.50 - 1.10 mg/dL   Calcium 9.7 8.4 - 10.5 mg/dL   GFR calc non Af Amer 8 (L) >90 mL/min   GFR calc Af Amer 9 (L) >90 mL/min    Comment: (NOTE) The eGFR has been calculated using the CKD EPI equation. This calculation has not been validated in all clinical situations. eGFR's persistently <90 mL/min signify  possible Chronic Kidney Disease.    Anion gap 15 5 - 15  Glucose, capillary Status: Abnormal   Collection Time: 01/31/15 10:29 AM  Result Value Ref Range   Glucose-Capillary 298 (H) 70 - 99 mg/dL  Glucose, capillary Status: Abnormal   Collection Time: 01/31/15 4:35 PM  Result Value Ref Range   Glucose-Capillary 105 (H) 70 - 99 mg/dL  Glucose, capillary Status: Abnormal   Collection Time: 01/31/15 10:08 PM  Result Value Ref Range   Glucose-Capillary 146 (H) 70 - 99 mg/dL   Comment 1 Notify RN    Comment 2 Document in Chart   Glucose, capillary Status: Abnormal   Collection Time: 02/01/15 7:04 AM  Result Value Ref Range   Glucose-Capillary 117 (H) 70 - 99 mg/dL   Comment 1 Notify RN    Comment 2 Document in Chart   Procalcitonin Status: None   Collection Time: 02/01/15 7:30 AM  Result Value Ref Range   Procalcitonin 1.21 ng/mL    Comment:   Interpretation: PCT > 0.5 ng/mL and <= 2 ng/mL: Systemic infection (sepsis) is possible, but other conditions are known to elevate PCT as well. (NOTE)  ICU PCT Algorithm Non ICU PCT Algorithm  ---------------------------- ------------------------------  PCT < 0.25 ng/mL PCT < 0.1 ng/mL  Stopping of antibiotics Stopping of antibiotics  strongly encouraged. strongly encouraged.  ---------------------------- ------------------------------  PCT level decrease by PCT < 0.25 ng/mL  >= 80% from peak PCT  OR PCT 0.25 - 0.5  ng/mL Stopping of antibiotics  encouraged.  Stopping of antibiotics  encouraged.  ---------------------------- ------------------------------  PCT level decrease by PCT >= 0.25 ng/mL  < 80% from peak PCT   AND PCT >= 0.5 ng/mL   Continuing antibiotics  encouraged.  Continuing antibiotics  encouraged.  ---------------------------- ------------------------------  PCT level increase compared PCT > 0.5 ng/mL  with peak PCT AND  PCT >= 0.5 ng/mL Escalation of antibiotics  strongly encouraged.  Escalation of antibiotics  strongly encouraged.   Glucose, capillary Status: Abnormal   Collection Time: 02/01/15 1:39 PM  Result Value Ref Range   Glucose-Capillary 205 (H) 70 - 99 mg/dL      Imaging Results (Last 48 hours)    Dg Chest 2 View  01/31/2015 CLINICAL DATA: Cough. History of hypertension and diabetes. Initial encounter. EXAM: CHEST 2 VIEW COMPARISON: Radiographs 01/23/2015 and 01/27/2015. Abdominal CT 01/29/2015. FINDINGS: The heart size and mediastinal contours appear stable. There is mild central airway thickening without hyperinflation, confluent airspace opacity or pleural effusion. Contrast material is noted within the colon from the recent CT. The bones appear unremarkable. Telemetry leads overlie the chest. IMPRESSION: Mild central airway thickening suggesting bronchitis. No evidence of pneumonia or edema. Electronically Signed By: Richardean Sale M.D. On: 01/31/2015 14:53        Medical Problem List and Plan: 1. 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 DVT Prophylaxis/Anticoagulation: Pharmaceutical: Lovenox 3. Pain Management: tylenol 4. Mood: team to provide egosupport as possible 5. Neuropsych: This patient is not capable of making decisions on her own behalf. 6. Skin/Wound Care: encourage adequate nutrition, pressure relief,  7. Fluids/Electrolytes/Nutrition: renal diet 1200cc FR 8. DM type 2: lantus insulin with SSI 9.  ESRD: HD per renal service, dialysis to occur after therapy completed, TTS schedule 10. Dysphagia: tolerating regular consistency diet at present 11. HTN: bp controlled at present, volume mgt with HD 12. Anemia of chronic disease: aranesp     Post Admission Physician Evaluation: 1. 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 Patient is admitted to receive collaborative, interdisciplinary care between the physiatrist, rehab nursing staff, and therapy team. 3. Patient's level of medical complexity and substantial therapy needs in context of that medical necessity cannot be provided at a lesser intensity of care such as a SNF. 4. Patient has experienced substantial functional loss from his/her baseline which was documented above under the "Functional History" and "Functional Status" headings. Judging by the patient's diagnosis, physical exam, and functional history, the patient has potential for functional progress which will result in measurable gains while on inpatient rehab. These gains will be of substantial and practical use upon discharge in facilitating mobility and self-care at the household level. 5. Physiatrist will provide 24 hour management of medical needs as well as oversight of the therapy plan/treatment and provide guidance as appropriate regarding the interaction of the two. 6. 24 hour rehab nursing will assist with bladder management, bowel management, safety, skin/wound care, disease management, medication administration, pain management and patient education and help integrate therapy concepts, techniques,education, etc. 7. PT will assess and treat for/with: Lower extremity strength, range of motion, stamina, balance, functional mobility, safety, adaptive techniques and equipment, NMR, . Goals are: supervision to mod I. 8. OT will assess and treat for/with: ADL's, functional mobility, safety, upper extremity strength, adaptive techniques and equipment,  NMR. Goals are: supervision to mod I. Therapy may proceed with showering this patient. 9. SLP will assess and treat for/with: cognition, communication. Goals  are: supervision to min assist. 10. Case Management and Social Worker will assess and treat for psychological issues and discharge planning. 11. Team conference will be held weekly to assess progress toward goals and to determine barriers to discharge. 12. Patient will receive at least 3 hours of therapy per day at least 5 days per week. 13. ELOS: 7-9 days  14. Prognosis: excellent     Meredith Staggers, MD, [facility] 02/01/2015

## 2015-02-01 NOTE — Progress Notes (Signed)
Report called to nurse on [facility]. Charge to assist with transfer. Pt alert & oriented x4. No pain or s/s of respiratory distress. Pt has no issues or complications at this time. Education and care plans resolved. Family at bedside. No further issues at this time.. VSS. Jillyn HiddenStone,Zylah Elsbernd R, RN

## 2015-02-01 NOTE — Progress Notes (Signed)
PMR Admission Coordinator Pre-Admission Assessment  Patient: Natalie [name] is an [age] y.o., female MRN: [id number] DOB: Jun 16, 1971 Height: 5\' 1"  (154.9 cm) Weight: 59 kg (130 lb 1.1 oz)  Insurance Information HMO: PPO: PCP: IPA: 80/20: OTHER:  PRIMARY: Self Pay Policy#: Subscriber:  CM Name: Phone#: Fax#:  Pre-Cert#: Employer:  Benefits: Phone #: Name:  Eff. Date: Deduct: Out of Pocket Max: Life Max:  CIR: SNF:  Outpatient: Co-Pay:  Home Health: Co-Pay:  DME: Co-Pay:  Providers:   Medicaid Application Date: Case Manager:  Disability Application Date: Case Worker:   Emergency Contact Information Contact Information    Name Relation Home Work Mobile   [name],[name] Nephew [phone]  [phone]   Cleophus MoltBenitez,[name] Niece [phone]       Current Medical History  Patient Admitting Diagnosis: left paramedian pontine infarct  History of Present Illness: Natalie [name] is a 44 y.o. RH-non english speaking female with history of hypertension,mild MR, ESRD-HD TTS who was admitted on 01/23/2015 with difficulty swallowing with drooling, dysarthria, R-sided weakness with increased difficulty walking. MRI of the brain showed acute brainstem infarct, left paracentral pons lacune with no mass effect as well as chronic lacunar infarcts of the right pons and left basal ganglia corona radiata. Carotid Dopplers without significant ICA stenosis. 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 She was noted to have leucocytosis likely due to UTI and was placed on Cipro for treatment. TSH elevated and synthroid added for supplement.  Swallow evaluation done and patient placed on dysphagia 3 diet and has progressed to thin liquids. Cognitive evaluation reveals patient at baseline. Dr. Roda ShuttersXu recommended ASA for left paracentral stroke due to small vessel disease. Pt. Clotted in HD 01/27/15 and is to underwent a fistulagram 3/31 and IR completed thrombolysis, thrombectomy and balloon angioplasty. During emergent HD on 4/241for hyperkalemia, pt experienced hypotension and unresponsiveness. EEG to evaluate for seizures was negative. Pt tolerated hemodialysis 01/30/15 without difficulties.  Pt with cough and temo max 100.2, CXR shows bronchitic, no pneumonia or edema. Pt ordered levaquin for bronchitis.  Total: 10 NIH    Past Medical History  Past Medical History  Diagnosis Date  . Hypertension   . Diabetes mellitus without complication   . Hyperlipidemia 01/24/2015  . ESRD on hemodialysis started 02/25/2013    [facility] -   . Anemia of chronic disease   . Secondary hyperparathyroidism     Family History  family history is not on file.  Prior Rehab/Hospitalizations: none per [name], niece  Current Medications   Current facility-administered medications:  . 0.9 % sodium chloride infusion, 250 mL, Intravenous, PRN, Pearson GrippeJames Kim, MD . acetaminophen (TYLENOL) tablet 650 mg, 650 mg, Oral, Q4H PRN, Rodolph Bonganiel Thompson V, MD, 650 mg at 01/30/15 1325 . albuterol (PROVENTIL) (2.5 MG/3ML) 0.083% nebulizer solution 2.5 mg, 2.5 mg, Nebulization, Q2H PRN, Kathlen ModyVijaya Akula, MD . atorvastatin (LIPITOR) tablet 80 mg, 80 mg, Oral, q1800, Pearson GrippeJames Kim, MD, 80 mg at 01/31/15 1723 . calcitRIOL (ROCALTROL) capsule 0.25 mcg, 0.25 mcg, Oral, Q M,W,F-HD, Weston SettleMartha Bergman, PA-C, 0.25 mcg at 01/29/15 1215 . calcium carbonate (dosed in mg elemental calcium) suspension 500 mg of elemental calcium, 500 mg of elemental calcium, Oral, Q6H PRN, Ramiro Harvestaniel Thompson V, MD, 500 mg of elemental calcium at 01/27/15 1811 .  camphor-menthol (SARNA) lotion 1 application, 1 application, Topical, Q8H PRN **AND** hydrOXYzine (ATARAX/VISTARIL) tablet 25 mg, 25 mg, Oral, Q8H PRN, Rodolph Bonganiel Thompson V, MD . clopidogrel (  PLAVIX) tablet 75 mg, 75 mg, Oral, Daily, Ralene Muskrat, PA-C, 75 mg at 02/01/15 1034 . Darbepoetin Alfa (ARANESP) injection 60 mcg, 60 mcg, Intravenous, Q Sat-HD, Annie Sable, MD, 60 mcg at 01/30/15 0932 . docusate sodium (ENEMEEZ) enema 283 mg, 1 enema, Rectal, PRN, Ramiro Harvest V, MD . fenofibrate tablet 160 mg, 160 mg, Oral, Daily, Rodolph Bong, MD, 160 mg at 02/01/15 1034 . guaifenesin (ROBITUSSIN) 100 MG/5ML syrup 200 mg, 200 mg, Oral, Q4H PRN, Kathlen Mody, MD, 200 mg at 01/31/15 2142 . insulin aspart (novoLOG) injection 0-5 Units, 0-5 Units, Subcutaneous, QHS, Pearson Grippe, MD, 2 Units at 01/29/15 2215 . insulin aspart (novoLOG) injection 0-9 Units, 0-9 Units, Subcutaneous, TID WC, Pearson Grippe, MD, 5 Units at 01/31/15 1240 . insulin glargine (LANTUS) injection 5 Units, 5 Units, Subcutaneous, QHS, Rodolph Bong, MD, 5 Units at 01/31/15 2142 . [START ON 02/02/2015] levofloxacin (LEVAQUIN) IVPB 500 mg, 500 mg, Intravenous, Q48H, Claudie Leach, RPH . levothyroxine (SYNTHROID, LEVOTHROID) tablet 25 mcg, 25 mcg, Oral, QAC breakfast, Rodolph Bong, MD, 25 mcg at 02/01/15 0820 . menthol-cetylpyridinium (CEPACOL) lozenge 3 mg, 1 lozenge, Oral, PRN, Kathlen Mody, MD, 3 mg at 01/31/15 1445 . multivitamin (RENA-VIT) tablet 1 tablet, 1 tablet, Oral, QHS, Weston Settle, PA-C, 1 tablet at 01/31/15 2142 . ondansetron (ZOFRAN) tablet 4 mg, 4 mg, Oral, Q6H PRN **OR** ondansetron (ZOFRAN) injection 4 mg, 4 mg, Intravenous, Q6H PRN, Rodolph Bong, MD, 4 mg at 01/30/15 1121 . promethazine (PHENERGAN) injection 12.5 mg, 12.5 mg, Intravenous, Q6H PRN, Kathlen Mody, MD . RESOURCE THICKENUP CLEAR, , Oral, PRN, Ramiro Harvest V, MD . sodium chloride 0.9 % injection 3 mL, 3 mL, Intravenous,  Q12H, Pearson Grippe, MD, 3 mL at 02/01/15 1035 . sodium chloride 0.9 % injection 3 mL, 3 mL, Intravenous, PRN, Pearson Grippe, MD, 3 mL at 01/31/15 0954 . sorbitol 70 % solution 30 mL, 30 mL, Oral, PRN, Rodolph Bong, MD, 30 mL at 01/26/15 2330 . sucroferric oxyhydroxide (VELPHORO) chewable tablet 500 mg, 500 mg, Oral, TID WC, Annie Sable, MD, 500 mg at 02/01/15 1610 . zolpidem (AMBIEN) tablet 5 mg, 5 mg, Oral, QHS PRN, Rodolph Bong, MD  Patients Current Diet: Diet renal/carb modified with fluid restriction Diet-HS Snack?: Nothing; Room service appropriate?: Yes; Fluid consistency:: Thin; Fluid restriction:: 1200 mL Fluid; no potatoes, no orange juice per orders   Precautions / Restrictions Precautions Precautions: Fall Restrictions Weight Bearing Restrictions: No   Prior Activity Level Limited Community (1-2x/wk): Pt. is out of the home 3x/week to her HD appointments on TuThSat. She is transported to HD by a family friend.    Home Assistive Devices / Equipment Home Assistive Devices/Equipment: CBG Meter, Other (Comment) (home blood pressure cuff) Home Equipment: None  Prior Functional Level Prior Function Level of Independence: Independent Comments: reports getting a ride to dialysis.  Current Functional Level Cognition  Arousal/Alertness: Awake/alert Overall Cognitive Status: History of cognitive impairments - at baseline Difficult to assess due to: Non-English speaking Orientation Level: Oriented to person, Oriented to place, Oriented to time Attention: Focused, Sustained Focused Attention: Appears intact Sustained Attention: Appears intact Memory: Appears intact (Similar to baseline) Awareness: Impaired Awareness Impairment: Emergent impairment, Anticipatory impairment Executive Function: Self Monitoring, Sequencing Sequencing: Appears intact Self Monitoring: Impaired Self Monitoring Impairment: Verbal basic Behaviors: Impulsive Safety/Judgment:  Impaired   Extremity Assessment (includes Sensation/Coordination)  Upper Extremity Assessment: RUE deficits/detail RUE Deficits / Details: pt able to move RUE through [facility] AROM range with extra effort  and time; 3-/5 at shoulder level, 3/5 at elbow and distal; decreased fine and gross motor coordination; stronger distal than proximal, some movements away from synergy pattern RUE Coordination: decreased fine motor, decreased gross motor  Lower Extremity Assessment: Defer to PT evaluation    ADLs  Overall ADL's : Needs assistance/impaired Eating/Feeding: Minimal assistance, Sitting Grooming: Minimal assistance, Sitting Upper Body Bathing: Minimal assitance, Sitting Lower Body Bathing: Minimal assistance, Sit to/from stand Upper Body Dressing : Minimal assistance, Sitting Lower Body Dressing: Minimal assistance, Sit to/from stand Toilet Transfer: Minimal assistance, Ambulation, RW Toileting- Clothing Manipulation and Hygiene: Minimal assistance, Sit to/from stand Tub/ Shower Transfer: Minimal assistance, Ambulation, Rolling walker Functional mobility during ADLs: Minimal assistance, Rolling walker General ADL Comments: Patient with baseline cognitive impairments and speaks spanish as her first language. Patient with ataxic movements during functional mobility. Complaints of stomach cramps. Patient transferred onto elevated toilet seat for toileting needs. Then stood at sink for grooming tasks of washing hands and combing hair. Educated patient on AAROM exercises > RUE to increase strength and functional use.     Mobility  Overal bed mobility: Needs Assistance Bed Mobility: Rolling, Sidelying to Sit Rolling: Min assist Sidelying to sit: Min assist, HOB elevated Supine to sit: Min assist Sit to supine: Min guard General bed mobility comments: Min A for control and elevation trunk to EOB. Patient's niece assisted with bed mobility    Transfers  Overall transfer level: Needs  assistance Equipment used: 2 person hand held assist Transfers: Sit to/from Stand Sit to Stand: Min assist, +2 safety/equipment Stand pivot transfers: Min guard General transfer comment: Min A for stability and balance. A to power up and translate weight anteriorly with stand.     Ambulation / Gait / Stairs / Wheelchair Mobility  Ambulation/Gait Ambulation/Gait assistance: Min assist, +2 physical assistance Ambulation Distance (Feet): 40 Feet Assistive device: 2 person hand held assist Gait Pattern/deviations: Drifts right/left, Staggering right, Staggering left, Scissoring, Ataxic, Shuffle Gait velocity interpretation: Below normal speed for age/gender General Gait Details: Patient ataxic and unstedy with gait this session. +2 min A for safety and stability. Decreased R foot clearance again this session. Limited by fatigue.     Posture / Balance Balance Overall balance assessment: Needs assistance Sitting-balance support: Feet supported, No upper extremity supported Sitting balance-Leahy Scale: Fair Standing balance support: No upper extremity supported, During functional activity Standing balance-Leahy Scale: Poor Standing balance comment: Patient requires Min A with functional standing balance. Cues for safety and positioning     Special needs/care consideration BiPAP/CPAP no CPM no Continuous Drip IV no Dialysis yes Days TuThSat Life Vest no Oxygen no Special Bed no Trach Size no Wound Vac (area) no  Skin No current issues  Bowel mgmt: Last BM 01/29/15 Tends toward constipation per niece Bladder mgmt: Continent , using BSC and toilet with assist Diabetic mgmt Yes, A1c 7.8 Interpreter will be arranged by Admissions Coordinator for first inpt rehab day of evaluations for 4 hrs   Previous Home Environment Living Arrangements: Other relatives Lives With: Family (pt. lives with her sister Johnny Bridge and  Chief Financial Officer daughter) Available Help at Discharge: Family Type of Home: Mobile home Home Layout: One level Home Access: Stairs to enter Entrance Stairs-Rails: Right Entrance Stairs-Number of Steps: (OT reports 8 steps, niece states 4-5) Bathroom Shower/Tub: Hydrographic surveyor, Health visitor: Standard Bathroom Accessibility: Yes How Accessible: Accessible via walker Home Care Services: No Additional Comments: pt lives with sister who works during the day  Discharge Living Setting  Plans for Discharge Living Setting: Patient's home, Lives with (comment), Other (Comment) (sister Johnny Bridge) Type of Home at Discharge: Mobile home Discharge Home Layout: One level Discharge Home Access: Stairs to enter Entrance Stairs-Rails: Left (different from OT note documenting R rail) Entrance Stairs-Number of Steps: 4-5 (per niece) Discharge Bathroom Shower/Tub: Tub/shower unit, Walk-in shower Discharge Bathroom Toilet: Standard Discharge Bathroom Accessibility: Yes How Accessible: Accessible via walker Does the patient have any problems obtaining your medications?: Yes (Describe) (prescription costs)  Social/Family/Support Systems Patient Roles: Other (Comment) (sister, aunt) Contact Information: Cleophus Molt, niece, [phone], lives in [place] Anticipated Caregiver: pt's sister Naveya Ellerman will be primary caregiver. She plans to bring help into the home and hopes to have someone room with them in return for providing their room and board. Anticipated Caregiver's Contact Information: Shawnna Pancake does not speak English, best contact is niece Cleophus Molt at [phone] Ability/Limitations of Caregiver: Johnny Bridge has an adult daughter with "Down's Syndrome" per niece 42. [name] reports that Martha's daughter ([name]'s sister) require constant supervision and assist for all ADLs. Johnny Bridge is busy much of the time caring for her daughter.  Caregiver Availability:  24/7 Discharge Plan Discussed with Primary Caregiver: Yes Is Caregiver In Agreement with Plan?: Yes Does Caregiver/Family have Issues with Lodging/Transportation while Pt is in Rehab?: No  Goals/Additional Needs Patient/Family Goal for Rehab: supervision for PT/OT; mod (I) to supervision for SLP Expected length of stay: 7-10 days Cultural Considerations: Pt. was born in [place], only speaks [place]. Her sister Johnny Bridge speaks [place] . Niece Kathrine Cords is fluent in [place], will be retruning to her home in [place] by April 7th. Equipment Needs: TBD, have notified family that pt. will likely need ramp assess for post HD weakness superimposed on CVA Special Service Needs: TBD; pt. on HD TuThSat; may need SCAT transport if frined cannot continue to transport pt. to HD Pt/Family Agrees to Admission and willing to participate: Yes Program Orientation Provided & Reviewed with Pt/Caregiver Including Roles & Responsibilities: Yes Additional Information Needs: Niece Cleophus Molt is best contact as she speaks fluent English and is very involved in care at this point Information Needs to be Provided By: Spanish interpreter    Decrease burden of Care through IP rehab admission: n/a  Possible need for SNF placement upon discharge: Not anticipated, family verbalizes commitment to care for her in the home  Patient Condition: This patient's medical and functional status has changed since the consult dated: 01/26/15 in which the Rehabilitation Physician determined and documented that the patient's condition is appropriate for intensive rehabilitative care in an inpatient rehabilitation facility. See "History of Present Illness" (above) for medical update. Functional changes are: overall min to mod assist. Patient's medical and functional status update has been discussed with the Rehabilitation physician and patient remains appropriate for inpatient rehabilitation. Will admit to inpatient rehab  today.  Preadmission Screen Completed By: Clois Dupes, 02/01/2015 11:44 AM ______________________________________________________________________  Discussed status with Dr. Riley Kill on 02/01/2015 at 1141 and received telephone approval for admission today.  Admission Coordinator: Clois Dupes, time 2956 Date 02/01/2015.          Cosigned by: Ranelle Oyster, MD at 02/01/2015 11:48 AM  Revision History     Date/Time User Provider Type Action   02/01/2015 11:48 AM Ranelle Oyster, MD Physician Cosign   02/01/2015 11:45 AM Standley Brooking, RN Rehab Admission Coordinator Sign   02/01/2015 11:43 AM Standley Brooking, RN Rehab Admission Coordinator Share   01/27/2015 1:57  PM Weldon Picking Rehab Admission Coordinator Share   View Details Report

## 2015-02-01 NOTE — Discharge Summary (Signed)
Physician Discharge Summary  Natalie [name] ZOX:[id number] DOB: 03/02/1971 DOA: 01/23/2015  PCP: No primary care provider on file.  Admit date: 01/23/2015 Discharge date: 02/01/2015  Time spent: 30 minutes  Recommendations for Outpatient Follow-up:  1. Follow up neurology as recommended in 2 months 2. Follow up with thyroid panel in 4 to 6 weeks.  3. Recommend checking CBC in 2 days, to make sure leukocytosis is resolved.    Discharge Diagnoses:  Principal Problem:   Brainstem stroke Active Problems:   ESRD (end stage renal disease)   Diabetes   CVA (cerebral infarction)   Life long cognitive dysfunction   Hypothyroidism   Hyperlipidemia   Anemia   Hyperkalemia   Leukocytosis   UTI (urinary tract infection)   Weakness   Discharge Condition: improved  Diet recommendation: renal diet  Filed Weights   01/29/15 0426 01/30/15 0817 01/30/15 1230  Weight: 61.6 kg (135 lb 12.9 oz) 60 kg (132 lb 4.4 oz) 59 kg (130 lb 1.1 oz)    History of present illness:   44 year old LADY WITH h/o hypertension , DM, ESRD on HD, mild MR comes in for speech difficulties and nausea. She was found to have acute brain stem infarct. Therapy evals orderd and recommended CIR. Patient seen with the interpretor at bedside.  She will be discharged to inpatient rehab when bed available.   Hospital Course:  1 acute brainstem infarct. Left paracentral troponins lacunar with no mass effect or hemorrhage Patient noted to have chronic lacunar infarcts of the right pons and left basal ganglia/corona radiata. Carotid Dopplers with no significant ICA stenosis from preliminary reading. 2-D echo reviewed showed LVEF OF 60 TO 65% with no regional wall abnormalities, doppler parameters are consistent with abnormal left ventricular relaxation. Unable to calculate LDL secondary to triglycerides of 526. Hemoglobin A1c is 7.8. Continue statin and TriCor. Aspirin changed to plavix for secondary stroke prevention.  PT/OT/ST evals done and recommending inpatient rehabilitation. Neurology following and appreciate input and recommendations. currently awaiting to hear from inpatient rehabilitation for bed on monday.   #2 hypotension: Resolved.   #3 hypothyroidism TSH elevated at 18.051. Continue Synthroid 25 g daily. Will need outpatient follow-up with thyroid function studies repeated in 4-6 weeks.  #4 bacteria in urine Urine cultures with insignificant growth. IV antibiotics are discontinued.   #5 diabetes mellitus Hemoglobin A1c = 7.8.  CBG (last 3)   Recent Labs (last 2 labs)      Recent Labs  01/31/15 0702 01/31/15 1029 01/31/15 1635  GLUCAP 140* 298* 105*      On SSI and low dose lantus. Resume home dose glipizide.   #6 end-stage renal disease on hemodialysis Tuesdays Thursdays Saturdays Patient for HD on TTS  #7 hyperkalemia RESOLVED.   #8 Hyperlipidemia Cholesterol = 305, TG 526. Continue statin and tricor.  #9 leukocytosis Likely reactive leukocytosis.  She has been afebrile the last 5 days. Urine cultures with insignificant growth. Chest x-ray negative for any acute infiltrates, shows some bronchitic changes. Started her on levaquin for bronchitis as her pro calcitonin levels were elevated. Please follow up with a CBC in 1 to 2 days.     #11 nausea , vomiting and an episode of unresponsiveness on 3/31: Probably from vaso vagal effect from orthostatic hypotension post HD. She was given a bolus of 250 ml of NS. And she became awake and alert and oriented a few min after the episode. The episode happened when she got up from the sitting position after her  BM. Liver panel normal. Lipase is normal. CXR negative for aspiration. EEG ordered to evaluate for seizures and was negative .   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 Reestablished flow in the  right upper arm fistula.    #13 persistent abdominal discomfort: CT abd and pelvis ordered and is negative for acute pathology.  Her pain is resolved.    #14 cough and congestion on 4/3: CXR ordered, her t max is 100.2, her CXR shows bronchitic, no pneumonia or edema.  levaquin ordered for 5 days to complete the course.        Procedures:  EEG  FISTULOGRAM   Consultations:  Neurology  Nephrology  Inpatient rehab  Discharge Exam: Filed Vitals:   02/01/15 1000  BP: 139/66  Pulse: 76  Temp: 98.2 F (36.8 C)  Resp: 18    General: alert afebrile comfortable Cardiovascular: s1s2 Respiratory: ctab  Discharge Instructions   Discharge Instructions    Ambulatory referral to Neurology    Complete by:  As directed   Pt [facility]. Thanks.     Diet - low sodium heart healthy    Complete by:  As directed      Discharge instructions    Complete by:  As directed   Please follow up with neurology as recommended          Current Discharge Medication List    START taking these medications   Details  albuterol (PROVENTIL) (2.5 MG/3ML) 0.083% nebulizer solution Take 3 mLs (2.5 mg total) by nebulization every 2 (two) hours as needed for wheezing or shortness of breath. Qty: 75 mL, Refills: 12    atorvastatin (LIPITOR) 80 MG tablet Take 1 tablet (80 mg total) by mouth daily at 6 PM.    calcium carbonate, dosed in mg elemental calcium, 1250 (500 CA) MG/5ML Take 5 mLs (500 mg of elemental calcium total) by mouth every 6 (six) hours as needed for indigestion. Qty: 450 mL    clopidogrel (PLAVIX) 75 MG tablet Take 1 tablet (75 mg total) by mouth daily.    fenofibrate 160 MG tablet Take 1 tablet (160 mg total) by mouth daily.    insulin aspart (NOVOLOG) 100 UNIT/ML injection Inject 0-9 Units into the skin 3 (three) times daily with meals. Qty: 10 mL, Refills: 11    insulin glargine (LANTUS) 100 UNIT/ML injection Inject 0.05 mLs  (5 Units total) into the skin at bedtime. Qty: 10 mL, Refills: 11    levofloxacin (LEVAQUIN) 500 MG tablet Take 1 tablet (500 mg total) by mouth every other day. Qty: 3 tablet, Refills: 0    levothyroxine (SYNTHROID, LEVOTHROID) 25 MCG tablet Take 1 tablet (25 mcg total) by mouth daily before breakfast.    multivitamin (RENA-VIT) TABS tablet Take 1 tablet by mouth at bedtime. Refills: 0    sucroferric oxyhydroxide (VELPHORO) 500 MG chewable tablet Chew 1 tablet (500 mg total) by mouth 3 (three) times daily with meals. Qty: 90 tablet      CONTINUE these medications which have NOT CHANGED   Details  glipiZIDE (GLUCOTROL) 5 MG tablet Take 5 mg by mouth daily before breakfast.      STOP taking these medications     amLODipine (NORVASC) 10 MG tablet      lisinopril (PRINIVIL,ZESTRIL) 40 MG tablet      sevelamer carbonate (RENVELA) 800 MG  tablet        No Known Allergies Follow-up Information    Follow up with Xu,Jindong, MD. Schedule an appointment as soon as possible for a visit in 2 months.   Specialty:  Neurology   Why:  stroke clinic   Contact information:   [address] [place] [id number] [phone]        The results of significant diagnostics from this hospitalization (including imaging, microbiology, ancillary and laboratory) are listed below for reference.    Significant Diagnostic Studies: Ct Abdomen Pelvis Wo Contrast  01/29/2015   CLINICAL DATA:  Abdominal pain.  EXAM: CT ABDOMEN AND PELVIS WITHOUT CONTRAST  TECHNIQUE: Multidetector CT imaging of the abdomen and pelvis was performed following the standard protocol without IV contrast.  COMPARISON:  None.  FINDINGS: Minimal atelectasis is present in the lung bases. There is no pleural effusion.  Diffusely decreased attenuation of the liver is consistent with mild to moderate steatosis. The spleen is lobular in contour without focal abnormality identified. The gallbladder, adrenal glands, and  pancreas have an unremarkable unenhanced appearance. There is marked bilateral renal atrophy.  Oral contrast is present in nondilated loops of small and large bowel to the level of the rectum without evidence of obstruction. The appendix is unremarkable. No gross bowel wall thickening or inflammation is identified.  Excreted contrast is present in the bladder, which is largely decompressed. The uterus and ovaries are identified. The uterus is anteverted. No pelvic mass is seen. Mild aortoiliac atherosclerotic calcification is noted. No free fluid or enlarged lymph nodes are identified. No acute osseous abnormality is identified.  IMPRESSION: 1. No acute abnormality identified in the abdomen or pelvis. 2. Marked bilateral renal atrophy. 3. Hepatic steatosis.   Electronically Signed   By: Sebastian Ache   On: 01/29/2015 14:54   Dg Chest 1 View  01/27/2015   CLINICAL DATA:  Lethargy, possible aspiration  EXAM: CHEST  1 VIEW  COMPARISON:  01/23/2015  FINDINGS: Cardiac shadow is mildly enlarged. The lungs are clear bilaterally. No focal bony abnormality is noted.  IMPRESSION: No active disease.   Electronically Signed   By: Alcide Clever M.D.   On: 01/27/2015 16:33   Dg Chest 2 View  01/31/2015   CLINICAL DATA:  Cough. History of hypertension and diabetes. Initial encounter.  EXAM: CHEST  2 VIEW  COMPARISON:  Radiographs 01/23/2015 and 01/27/2015. Abdominal CT 01/29/2015.  FINDINGS: The heart size and mediastinal contours appear stable. There is mild central airway thickening without hyperinflation, confluent airspace opacity or pleural effusion. Contrast material is noted within the colon from the recent CT. The bones appear unremarkable. Telemetry leads overlie the chest.  IMPRESSION: Mild central airway thickening suggesting bronchitis. No evidence of pneumonia or edema.   Electronically Signed   By: Carey Bullocks M.D.   On: 01/31/2015 14:53   Dg Chest 2 View  01/23/2015   CLINICAL DATA:  Slurred speech and  generalized weakness  EXAM: CHEST  2 VIEW  COMPARISON:  None.  FINDINGS: Lungs are clear. Heart is mildly enlarged with pulmonary vascularity within normal limits. No adenopathy. No bone lesions.  IMPRESSION: Mild cardiomegaly.  No edema or consolidation.   Electronically Signed   By: Bretta Bang III M.D.   On: 01/23/2015 13:14   Ct Head Wo Contrast  01/27/2015   CLINICAL DATA:  Generalized weakness. Diabetes mellitus. Syncopal episode during hemodialysis. Subsequent encounter.  EXAM: CT HEAD WITHOUT CONTRAST  TECHNIQUE: Contiguous axial images were obtained  from the base of the skull through the vertex without intravenous contrast.  COMPARISON:  MR brain 01/23/2015 demonstrated an acute brainstem infarct.  FINDINGS: Aside from intense cytotoxic edema in the LEFT paramedian pontine infarct, no new acute infarction is demonstrated. There is a chronic deep white matter infarct in the subinsular region on the LEFT. Similar small chronic lacune RIGHT pons.  No hemorrhage, mass lesion, hydrocephalus, or extra-axial fluid.  Normal for age cerebral volume. No appreciable hypoattenuation white matter.  Calvarium intact.  No sinus or mastoid disease.  Compared with prior MR, when technique differences are considered, the overall distribution of the late acute to early subacute LEFT brainstem infarct appears similar.  IMPRESSION: Increasingly well-demarcated LEFT paramedian pontine infarct, late acute to early subacute time course. No hemorrhagic transformation or new areas of infarction are identified.   Electronically Signed   By: Davonna Belling M.D.   On: 01/27/2015 16:28   Ct Head Wo Contrast  01/23/2015   CLINICAL DATA:  Generalized weakness, dysphagia  EXAM: CT HEAD WITHOUT CONTRAST  TECHNIQUE: Contiguous axial images were obtained from the base of the skull through the vertex without intravenous contrast.  COMPARISON:  None.  FINDINGS: No skull fracture is noted. Paranasal sinuses and mastoid air cells are  unremarkable. No intracranial hemorrhage, mass effect or midline shift.  No acute infarction. No mass lesion is noted on this unenhanced scan. No hydrocephalus. The gray and white-matter differentiation is preserved.  IMPRESSION: No acute intracranial abnormality.   Electronically Signed   By: Natasha Mead M.D.   On: 01/23/2015 12:28   Mr Brain Wo Contrast  01/23/2015   CLINICAL DATA:  44 year old female with weakness, slurred speech, difficulty swallowing. Initial encounter.  EXAM: MRI HEAD WITHOUT CONTRAST  TECHNIQUE: Multiplanar, multiecho pulse sequences of the brain and surrounding structures were obtained without intravenous contrast.  COMPARISON:  Head CT without contrast 1206 hours today.  FINDINGS: Restricted diffusion in a patchy and linear distribution in the left paracentral pons (series 4, image 15 and series 8, image 16). Nearby small right paracentral chronic pontine lacunar infarct (series 5, image 10) with hemosiderin. No hemorrhage associated with the acute lacune. T2 and FLAIR hyperintensity with no significant mass effect.  No other restricted diffusion. Major intracranial vascular flow voids are within normal limits.  Left corona radiata and lentiform nuclei a chronic lacunar infarct(s). No cortical encephalomalacia identified. No other chronic blood products identified. No midline shift, mass effect, evidence of mass lesion, ventriculomegaly, extra-axial collection or acute intracranial hemorrhage. Cervicomedullary junction and pituitary are within normal limits.  Normal bone marrow signal. Negative visible cervical spine. Visible internal auditory structures appear normal. Visualized paranasal sinuses and mastoids are clear. Visualized orbit soft tissues are within normal limits. Visualized scalp soft tissues are within normal limits.  IMPRESSION: 1. Acute brainstem infarct. Left paracentral pons lacune with no mass effect or hemorrhage. 2. Chronic lacunar infarcts of the right pons and left  basal ganglia/corona radiata. Chronic microhemorrhage associated with the former.   Electronically Signed   By: Odessa Fleming M.D.   On: 01/23/2015 17:07   Ir Pta Venous Right  01/28/2015   CLINICAL DATA:  44 year old with end-stage renal disease and right upper arm cephalic vein fistula. Poor flows in the right upper arm fistula. Patient was recently admitted for a brainstem non-hemorrhagic stroke  EXAM: DIALYSIS FISTULA DECLOT ; CEPHALIC VEIN ANGIOPLASTY; ULTRASOUND GUIDANCE FOR VASCULAR ACCESS  Physician: Rachelle Hora. Lowella Dandy, MD  FLUOROSCOPY TIME:  20 minutes and 30  seconds, 232 mGy  MEDICATIONS AND MEDICAL HISTORY: Heparin 3000 units, TPA 2 mg, Versed 2 mg, Fentanyl 150 mcg. A radiology nurse monitored the patient for moderate sedation.  ANESTHESIA/SEDATION: Moderate sedation time: 90 minutes  CONTRAST:  Ninety ml Omnipaque 300  PROCEDURE: Informed consent was obtained with a Nurse, learning disability. The right upper arm was evaluated with ultrasound. 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 The right upper arm was prepped and draped in a sterile fashion. Maximal barrier sterile technique was utilized including caps, mask, sterile gowns, sterile gloves, sterile drape, hand hygiene and skin antiseptic. The skin was anesthetized with 1% lidocaine. The fistula was accessed using 21 gauge needles towards the central venous system and arterial anastomosis with ultrasound guidance. Ultrasound images were obtained for documentation. Micropuncture catheters were placed. The vascular access pointing towards the central veins was upsized to a 6-French vascular sheath. A 5-French catheter was advanced into the central venous structures and a central venogram was performed. Heparin was given through this 5 [place] catheter. 2 mg of tPA was infused along the occluded cephalic vein as the catheter was slowly pulled back. Fluoroscopic images were saved for documentation. A Bentson wire  was placed centrally and the cephalic vein was treated with the AngioJet thrombectomy device. The access pointing towards the arterial anastomosis was upsized to a 6-French sheath. A wire was advanced into the arterial system. The arterial plug was pulled using a 5 [place] Fogarty balloon. The Fogarty balloon was pulled across the arterial anastomosis three additional times until there was some flow within the fistula.  At this point, there appeared to be an outflow obstruction. Pull-back venograms demonstrated stenosis in the central cephalic vein at the right shoulder. Central cephalic vein was treated multiple times with a 6 mm x 40 mm Conquest balloon. There was still very poor flow through the graft. As a result, the wire was removed and the aneurysmal segments were treated with the PTD thrombectomy device. After using the PTD thrombectomy device, there was markedly improved flow throughout the fistula. Venograms demonstrated residual clot and stenosis at the central cephalic vein. This area was treated again with the 6 mm balloon. Follow venograms demonstrated persistent poor flow in this area due to thrombus. This area was treated again with the Angiojet device. Following treatment with the Angiojet device, there was markedly improved flow through the central cephalic vein. 5 French catheter was advanced into the upper arm artery and the arterial anastomosis was evaluated with an arteriogram. The sheath pointing towards the anastomosis was removed with a pursestring suture. Follow up venogram demonstrated residual narrowing where the sheath was removed. This segment of the vein was treated with an 8 mm x 40 mm Conquest balloon and follow-up angiogram images were obtained. The remaining vascular sheath was removed with a pursestring.  Estimated blood loss: Minimal  FINDINGS: Ultrasound demonstrated an occluded right upper arm cephalic vein. 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 Thrombus extends up to the right shoulder by ultrasound. Venogram also demonstrated thrombus throughout the right upper arm cephalic vein. In addition, there was a stenosis in the central cephalic vein. After the declot procedure, there was flow throughout the cephalic vein fistula. There was residual non-occlusive thrombus in the aneurysmal segments in the distal humeral region. Arterial anastomosis is patent. Central veins patent.  Complication:  None  IMPRESSION: Successful declot of the right upper arm fistula.  The right upper arm fistula remains amendable to percutaneous treatment.   Electronically Signed   By: Richarda Overlie M.D.   On: 01/28/2015 16:50   Ir Angio Av Shunt Addl Access  01/28/2015   CLINICAL DATA:  44 year old with end-stage renal disease and right upper arm cephalic vein fistula. Poor flows in the right upper arm fistula. Patient was recently admitted for a brainstem non-hemorrhagic stroke  EXAM: DIALYSIS FISTULA DECLOT ; CEPHALIC VEIN ANGIOPLASTY; ULTRASOUND GUIDANCE FOR VASCULAR ACCESS  Physician: Rachelle Hora. Lowella Dandy, MD  FLUOROSCOPY TIME:  20 minutes and 30 seconds, 232 mGy  MEDICATIONS AND MEDICAL HISTORY: Heparin 3000 units, TPA 2 mg, Versed 2 mg, Fentanyl 150 mcg. A radiology nurse monitored the patient for moderate sedation.  ANESTHESIA/SEDATION: Moderate sedation time: 90 minutes  CONTRAST:  Ninety ml Omnipaque 300  PROCEDURE: Informed consent was obtained with a Nurse, learning disability. The right upper arm was evaluated with ultrasound. 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 The right upper arm was prepped and draped in a sterile fashion. Maximal barrier sterile technique was utilized including caps, mask, sterile gowns, sterile gloves, sterile drape, hand hygiene and skin antiseptic. The skin was anesthetized with 1% lidocaine. The fistula was accessed using 21 gauge needles towards the central venous system and arterial  anastomosis with ultrasound guidance. Ultrasound images were obtained for documentation. Micropuncture catheters were placed. The vascular access pointing towards the central veins was upsized to a 6-French vascular sheath. A 5-French catheter was advanced into the central venous structures and a central venogram was performed. Heparin was given through this 5 [place] catheter. 2 mg of tPA was infused along the occluded cephalic vein as the catheter was slowly pulled back. Fluoroscopic images were saved for documentation. A Bentson wire was placed centrally and the cephalic vein was treated with the AngioJet thrombectomy device. The access pointing towards the arterial anastomosis was upsized to a 6-French sheath. A wire was advanced into the arterial system. The arterial plug was pulled using a 5 [place] Fogarty balloon. The Fogarty balloon was pulled across the arterial anastomosis three additional times until there was some flow within the fistula.  At this point, there appeared to be an outflow obstruction. Pull-back venograms demonstrated stenosis in the central cephalic vein at the right shoulder. Central cephalic vein was treated multiple times with a 6 mm x 40 mm Conquest balloon. There was still very poor flow through the graft. As a result, the wire was removed and the aneurysmal segments were treated with the PTD thrombectomy device. After using the PTD thrombectomy device, there was markedly improved flow throughout the fistula. Venograms demonstrated residual clot and stenosis at the central cephalic vein. This area was treated again with the 6 mm balloon. Follow venograms demonstrated persistent poor flow in this area due to thrombus. This area was treated again with the Angiojet device. Following treatment with the Angiojet device, there was markedly improved flow through the central cephalic vein. 5 French catheter was advanced into the upper arm artery and the arterial anastomosis was evaluated with  an arteriogram. The sheath pointing towards the anastomosis was removed with a pursestring suture. Follow up venogram demonstrated residual narrowing where the sheath was removed. This segment of the vein was treated with an 8 mm x 40 mm Conquest balloon and follow-up angiogram images were obtained. The remaining vascular sheath was removed with a pursestring.  Estimated blood loss: Minimal  FINDINGS: Ultrasound demonstrated an  occluded right upper arm cephalic vein. 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 Thrombus extends up to the right shoulder by ultrasound. Venogram also demonstrated thrombus throughout the right upper arm cephalic vein. In addition, there was a stenosis in the central cephalic vein. After the declot procedure, there was flow throughout the cephalic vein fistula. There was residual non-occlusive thrombus in the aneurysmal segments in the distal humeral region. Arterial anastomosis is patent. Central veins patent.  Complication:  None  IMPRESSION: Successful declot of the right upper arm fistula.  The right upper arm fistula remains amendable to percutaneous treatment.   Electronically Signed   By: Richarda Overlie M.D.   On: 01/28/2015 16:50   Ir US Guide Vasc Access Right  01/28/2015   CLINICAL DATA:  44 year old with end-stage renal disease and right upper arm cephalic vein fistula. Poor flows in the right upper arm fistula. Patient was recently admitted for a brainstem non-hemorrhagic stroke  EXAM: DIALYSIS FISTULA DECLOT ; CEPHALIC VEIN ANGIOPLASTY; ULTRASOUND GUIDANCE FOR VASCULAR ACCESS  Physician: Rachelle Hora. Lowella Dandy, MD  FLUOROSCOPY TIME:  20 minutes and 30 seconds, 232 mGy  MEDICATIONS AND MEDICAL HISTORY: Heparin 3000 units, TPA 2 mg, Versed 2 mg, Fentanyl 150 mcg. A radiology nurse monitored the patient for moderate sedation.  ANESTHESIA/SEDATION: Moderate sedation time: 90 minutes  CONTRAST:  Ninety ml Omnipaque 300  PROCEDURE: Informed consent was  obtained with a Nurse, learning disability. The right upper arm was evaluated with ultrasound. 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 The right upper arm was prepped and draped in a sterile fashion. Maximal barrier sterile technique was utilized including caps, mask, sterile gowns, sterile gloves, sterile drape, hand hygiene and skin antiseptic. The skin was anesthetized with 1% lidocaine. The fistula was accessed using 21 gauge needles towards the central venous system and arterial anastomosis with ultrasound guidance. Ultrasound images were obtained for documentation. Micropuncture catheters were placed. The vascular access pointing towards the central veins was upsized to a 6-French vascular sheath. A 5-French catheter was advanced into the central venous structures and a central venogram was performed. Heparin was given through this 5 [place] catheter. 2 mg of tPA was infused along the occluded cephalic vein as the catheter was slowly pulled back. Fluoroscopic images were saved for documentation. A Bentson wire was placed centrally and the cephalic vein was treated with the AngioJet thrombectomy device. The access pointing towards the arterial anastomosis was upsized to a 6-French sheath. A wire was advanced into the arterial system. The arterial plug was pulled using a 5 [place] Fogarty balloon. The Fogarty balloon was pulled across the arterial anastomosis three additional times until there was some flow within the fistula.  At this point, there appeared to be an outflow obstruction. Pull-back venograms demonstrated stenosis in the central cephalic vein at the right shoulder. Central cephalic vein was treated multiple times with a 6 mm x 40 mm Conquest balloon. There was still very poor flow through the graft. As a result, the wire was removed and the aneurysmal segments were treated with the PTD thrombectomy device. After using the PTD thrombectomy device,  there was markedly improved flow throughout the fistula. Venograms demonstrated residual clot and stenosis at the central cephalic vein. This area was treated again with the 6 mm balloon. Follow venograms demonstrated persistent poor flow in this area due to thrombus. This area was treated again with the Angiojet device. Following  treatment with the Angiojet device, there was markedly improved flow through the central cephalic vein. 5 French catheter was advanced into the upper arm artery and the arterial anastomosis was evaluated with an arteriogram. The sheath pointing towards the anastomosis was removed with a pursestring suture. Follow up venogram demonstrated residual narrowing where the sheath was removed. This segment of the vein was treated with an 8 mm x 40 mm Conquest balloon and follow-up angiogram images were obtained. The remaining vascular sheath was removed with a pursestring.  Estimated blood loss: Minimal  FINDINGS: Ultrasound demonstrated an occluded right upper arm cephalic vein. 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 Thrombus extends up to the right shoulder by ultrasound. Venogram also demonstrated thrombus throughout the right upper arm cephalic vein. In addition, there was a stenosis in the central cephalic vein. After the declot procedure, there was flow throughout the cephalic vein fistula. There was residual non-occlusive thrombus in the aneurysmal segments in the distal humeral region. Arterial anastomosis is patent. Central veins patent.  Complication:  None  IMPRESSION: Successful declot of the right upper arm fistula.  The right upper arm fistula remains amendable to percutaneous treatment.   Electronically Signed   By: Richarda Overlie M.D.   On: 01/28/2015 16:50   Ir Declot Right Mod Sed  01/28/2015   CLINICAL DATA:  44 year old with end-stage renal disease and right upper arm cephalic vein fistula. Poor flows in the right upper arm fistula.  Patient was recently admitted for a brainstem non-hemorrhagic stroke  EXAM: DIALYSIS FISTULA DECLOT ; CEPHALIC VEIN ANGIOPLASTY; ULTRASOUND GUIDANCE FOR VASCULAR ACCESS  Physician: Rachelle Hora. Lowella Dandy, MD  FLUOROSCOPY TIME:  20 minutes and 30 seconds, 232 mGy  MEDICATIONS AND MEDICAL HISTORY: Heparin 3000 units, TPA 2 mg, Versed 2 mg, Fentanyl 150 mcg. A radiology nurse monitored the patient for moderate sedation.  ANESTHESIA/SEDATION: Moderate sedation time: 90 minutes  CONTRAST:  Ninety ml Omnipaque 300  PROCEDURE: Informed consent was obtained with a Nurse, learning disability. The right upper arm was evaluated with ultrasound. 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 The right upper arm was prepped and draped in a sterile fashion. Maximal barrier sterile technique was utilized including caps, mask, sterile gowns, sterile gloves, sterile drape, hand hygiene and skin antiseptic. The skin was anesthetized with 1% lidocaine. The fistula was accessed using 21 gauge needles towards the central venous system and arterial anastomosis with ultrasound guidance. Ultrasound images were obtained for documentation. Micropuncture catheters were placed. The vascular access pointing towards the central veins was upsized to a 6-French vascular sheath. A 5-French catheter was advanced into the central venous structures and a central venogram was performed. Heparin was given through this 5 [place] catheter. 2 mg of tPA was infused along the occluded cephalic vein as the catheter was slowly pulled back. Fluoroscopic images were saved for documentation. A Bentson wire was placed centrally and the cephalic vein was treated with the AngioJet thrombectomy device. The access pointing towards the arterial anastomosis was upsized to a 6-French sheath. A wire was advanced into the arterial system. The arterial plug was pulled using a 5 [place] Fogarty balloon. The Fogarty balloon was pulled across  the arterial anastomosis three additional times until there was some flow within the fistula.  At this point, there appeared to be an outflow obstruction. Pull-back venograms demonstrated stenosis in the central cephalic vein at the right shoulder. Central cephalic vein  was treated multiple times with a 6 mm x 40 mm Conquest balloon. There was still very poor flow through the graft. As a result, the wire was removed and the aneurysmal segments were treated with the PTD thrombectomy device. After using the PTD thrombectomy device, there was markedly improved flow throughout the fistula. Venograms demonstrated residual clot and stenosis at the central cephalic vein. This area was treated again with the 6 mm balloon. Follow venograms demonstrated persistent poor flow in this area due to thrombus. This area was treated again with the Angiojet device. Following treatment with the Angiojet device, there was markedly improved flow through the central cephalic vein. 5 French catheter was advanced into the upper arm artery and the arterial anastomosis was evaluated with an arteriogram. The sheath pointing towards the anastomosis was removed with a pursestring suture. Follow up venogram demonstrated residual narrowing where the sheath was removed. This segment of the vein was treated with an 8 mm x 40 mm Conquest balloon and follow-up angiogram images were obtained. The remaining vascular sheath was removed with a pursestring.  Estimated blood loss: Minimal  FINDINGS: Ultrasound demonstrated an occluded right upper arm cephalic vein. 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 Thrombus extends up to the right shoulder by ultrasound. Venogram also demonstrated thrombus throughout the right upper arm cephalic vein. In addition, there was a stenosis in the central cephalic vein. After the declot procedure, there was flow throughout the cephalic vein fistula. There was residual non-occlusive  thrombus in the aneurysmal segments in the distal humeral region. Arterial anastomosis is patent. Central veins patent.  Complication:  None  IMPRESSION: Successful declot of the right upper arm fistula.  The right upper arm fistula remains amendable to percutaneous treatment.   Electronically Signed   By: Richarda Overlie M.D.   On: 01/28/2015 16:50    Microbiology: Recent Results (from the past 240 hour(s))  Culture, Urine     Status: None   Collection Time: 01/25/15  3:37 AM  Result Value Ref Range Status   Specimen Description URINE, RANDOM  Final   Special Requests NONE  Final   Colony Count   Final    9,000 COLONIES/ML Performed at [facility]    Culture   Final    INSIGNIFICANT GROWTH Performed at [facility]    Report Status 01/26/2015 FINAL  Final     Labs: Basic Metabolic Panel:  Recent Labs Lab 01/26/15 0554 01/27/15 0539  01/28/15 1610 01/28/15 1920 01/29/15 0139 01/30/15 0553 01/31/15 0907  NA 135 136  < > 135 132* 136 135 134*  K 5.3* 6.2*  < > 6.0* >7.5* 5.0 3.7 4.2  CL 97 99  < > 97 94* 96 95* 91*  CO2 26 22  < > GLUCOSE 142* 162*  < > 143* 240* 146* 127* 181*  BUN 33* 52*  < > 40* 53* 57* 38* 25*  CREATININE 6.25* 8.19*  < > 7.22* 8.51* 9.05* 7.50* 5.95*  CALCIUM 9.2 9.4  < > 9.7 9.3 9.0 8.6 9.7  PHOS 5.7* 6.9*  --   --   --   --   --   --   < > = values in this interval not displayed. Liver Function Tests:  Recent Labs Lab 01/26/15 0554 01/27/15 0539 01/27/15 1917  AST  --   --  37  ALT  --   --  27  ALKPHOS  --   --  90  BILITOT  --   --  0.6  PROT  --   --  8.3  ALBUMIN 3.4* 3.6 3.6    Recent Labs Lab 01/27/15 1917  LIPASE 37   No results for input(s): AMMONIA in the last 168 hours. CBC:  Recent Labs Lab 01/26/15 0554 01/27/15 0539 01/28/15 0638 01/31/15 0907  WBC 11.9* 12.5* 16.2* 16.0*  HGB 11.9* 11.6* 10.3* 9.5*  HCT 36.6 36.7 32.0* 29.9*  MCV 97.1 98.4 97.3 97.7  PLT 424* 463* 450* 513*    Cardiac Enzymes: No results for input(s): CKTOTAL, CKMB, CKMBINDEX, TROPONINI in the last 168 hours. BNP: BNP (last 3 results) No results for input(s): BNP in the last 8760 hours.  ProBNP (last 3 results) No results for input(s): PROBNP in the last 8760 hours.  CBG:  Recent Labs Lab 01/31/15 0702 01/31/15 1029 01/31/15 1635 01/31/15 2208 02/01/15 0704  GLUCAP 140* 298* 105* 146* 117*       Signed:  Skanda Worlds  [facility] 02/01/2015, 1:18 PM

## 2015-02-01 NOTE — Progress Notes (Signed)
[facility] Progress Note  Assessment/Plan: 1. 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 ESRD - TTS via AVF -  S/p fistula declot on 3/31 followed by  HD late. Next HD planned for Tuesday. 3.  Hypertension/volume - CXR NAD on admission - BP good - volume is not an issue 4. Anemia - Hgb down to 10's -added ESA 5. Metabolic bone disease - Continue calcitriol at current dose - P up - have changed binder to velphoro due to size of pills  - D 3 diet could be higher in P 6. Nutrition - D3 diet /thin liquids+ renavite 7. DM - on glucotrol as an outpt - per primary - BS 160 - 250 on SSI 8. Hypothyroidism - TSH -18 -synthroid started 9. Disposition - Evaluated by rehab - may have support problems after d/c - has not been accepted yet- this is likely to be an issue     Vinson Moselleob Avy Barlett MD (pgr) [phone]    ([phone]) [phone] 02/01/2015, 3:15 PM    Subjective:  HD yest- no issues- removed 1100 -   Objective Filed Vitals:   01/31/15 2210 02/01/15 0253 02/01/15 0706 02/01/15 1000  BP: 130/67 117/66 157/68 139/66  Pulse: 76 81 79 76  Temp: 98.6 F (37 C) 98.3 F (36.8 C) 98.3 F (36.8 C) 98.2 F (36.8 C)  TempSrc: Oral Oral Oral Oral  Resp: 20 18 18 18   Height:      Weight:      SpO2: 97% 97% 97% 98%   Physical Exam General: supine ,  resting Heart: RRR Lungs: no rales Abdomen: soft NT Extremities: SCDs no overt edema Dialysis Access: right upper AVF patent   HD: [facility] 4h   60.5kg   2/2 [place]  Heparin 3500  RUA AVF MIrcera 50 q 4 wks,  Calcitriol 0.25ug tiw Last pth 639, ferr 1473   Additional Objective Labs: Basic Metabolic Panel:  Recent Labs Lab 01/26/15 0554 01/27/15 0539  01/29/15 0139 01/30/15 0553 01/31/15 0907  NA 135 136  < > 136 135 134*  K 5.3* 6.2*  < > 5.0 3.7 4.2  CL 97 99  < > 96 95* 91*  CO2 26 22  < > 22 29 28   GLUCOSE 142* 162*  < > 146* 127* 181*  BUN 33* 52*  < > 57* 38* 25*  CREATININE 6.25* 8.19*  < >  9.05* 7.50* 5.95*  CALCIUM 9.2 9.4  < > 9.0 8.6 9.7  PHOS 5.7* 6.9*  --   --   --   --   < > = values in this interval not displayed. Liver Function Tests:  Recent Labs Lab 01/26/15 0554 01/27/15 0539 01/27/15 1917  AST  --   --  37  ALT  --   --  27  ALKPHOS  --   --  90  BILITOT  --   --  0.6  PROT  --   --  8.3  ALBUMIN 3.4* 3.6 3.6    Recent Labs Lab 01/27/15 1917  LIPASE 37   CBC:  Recent Labs Lab 01/26/15 0554 01/27/15 0539 01/28/15 0638 01/31/15 0907 02/01/15 1440  WBC 11.9* 12.5* 16.2* 16.0* 14.6*  HGB 11.9* 11.6* 10.3* 9.5* 8.8*  HCT 36.6 36.7 32.0* 29.9* 27.0*  MCV 97.1 98.4 97.3 97.7 94.7  PLT 424* 463* 450* 513* 571*   Blood Culture    Component Value Date/Time   SDES URINE, RANDOM 01/25/2015 [id number]  SPECREQUEST NONE 01/25/2015 0337   CULT  01/25/2015 0337    INSIGNIFICANT GROWTH Performed at [facility]    REPTSTATUS 01/26/2015 FINAL 01/25/2015 1308    Cardiac Enzymes: No results for input(s): CKTOTAL, CKMB, CKMBINDEX, TROPONINI in the last 168 hours. CBG:  Recent Labs Lab 01/31/15 1029 01/31/15 1635 01/31/15 2208 02/01/15 0704 02/01/15 1339  GLUCAP 298* 105* 146* 117* 205*  Medications:   . atorvastatin  80 mg Oral q1800  . calcitRIOL  0.25 mcg Oral Q M,W,F-HD  . clopidogrel  75 mg Oral Daily  . darbepoetin (ARANESP) injection - DIALYSIS  60 mcg Intravenous Q Sat-HD  . fenofibrate  160 mg Oral Daily  . insulin aspart  0-5 Units Subcutaneous QHS  . insulin aspart  0-9 Units Subcutaneous TID WC  . insulin glargine  5 Units Subcutaneous QHS  . [START ON 02/02/2015] levofloxacin (LEVAQUIN) IV  500 mg Intravenous Q48H  . levothyroxine  25 mcg Oral QAC breakfast  . multivitamin  1 tablet Oral QHS  . sodium chloride  3 mL Intravenous Q12H  . sucroferric oxyhydroxide  500 mg Oral TID WC

## 2015-02-01 NOTE — Progress Notes (Signed)
Assisted RN in transferring pt to Rehab.  Belongings placed in room and dentures in cup handed to NT.

## 2015-02-01 NOTE — Progress Notes (Signed)
Ranelle Oyster, MD Physician Signed Physical Medicine and Rehabilitation Consult Note 01/26/2015 9:50 AM  Related encounter: ED to Hosp-Admission (Current) from 01/23/2015 in [facility]    Expand All Collapse All        Physical Medicine and Rehabilitation Consult Reason for Consult: Acute brainstem infarct. Left paracentral pons lacunar Referring Physician: [facility]   HPI: Natalie [name] is a 44 y.o. right handed Spanish speaking female with history of hypertension, diabetes mellitus and peripheral neuropathy, end-stage renal disease with hemodialysis as well as mild MR. By report patient lives with her sister. Presented 01/23/2015 with dysarthria and right side weakness. MRI of the brain showed acute brainstem infarct. 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 Carotid Dopplers with no ICA stenosis. Echocardiogram is pending. Patient did not receive TPA. Neurology consulted placed on Plavix for CVA prophylaxis. Aging continues on dialysis as directed. Presently on a mechanical soft nectar thick liquid diet. Physical therapy evaluation completed with recommendations of physical medicine rehabilitation consult.   Review of Systems  Unable to perform ROS: language   Past Medical History  Diagnosis Date  . Hypertension   . Diabetes mellitus without complication   . Hyperlipidemia 01/24/2015  . ESRD on hemodialysis started 02/25/2013    [facility] -   . Anemia of chronic disease   . Secondary hyperparathyroidism    Past Surgical History  Procedure Laterality Date  . Insertion of dialysis catheter     History reviewed. No pertinent family history. Social History:  reports that she has never smoked. She does not have any smokeless tobacco history on file. She reports that she does not drink alcohol or use illicit  drugs. Allergies: No Known Allergies Medications Prior to Admission  Medication Sig Dispense Refill  . amLODipine (NORVASC) 10 MG tablet Take 10 mg by mouth daily.    Marland Kitchen glipiZIDE (GLUCOTROL) 5 MG tablet Take 5 mg by mouth daily before breakfast.    . lisinopril (PRINIVIL,ZESTRIL) 40 MG tablet Take 40 mg by mouth at bedtime.    . sevelamer carbonate (RENVELA) 800 MG tablet Take 800 mg by mouth 2 (two) times daily with a meal.      Home: Home Living Family/patient expects to be discharged to:: Private residence Living Arrangements: Other relatives Available Help at Discharge: Family Type of Home: House Home Layout: One level Home Equipment: None Lives With: Family, Other (Comment) (Sister and Niece.)  Functional History: Prior Function Level of Independence: Independent Comments: reports getting a ride to dialysis. Functional Status:  Mobility: Bed Mobility Overal bed mobility: Needs Assistance Bed Mobility: Supine to Sit, Sit to Supine Supine to sit: Min assist Sit to supine: Min guard General bed mobility comments: Increased time to get to EOB.  Transfers Overall transfer level: Needs assistance Equipment used: 1 person hand held assist Transfers: Sit to/from Stand Sit to Stand: Min guard Stand pivot transfers: Min guard General transfer comment: Mildly unsteady in standing.  Ambulation/Gait Ambulation/Gait assistance: Min assist Ambulation Distance (Feet): 125 Feet Assistive device: None Gait Pattern/deviations: Step-through pattern, Decreased stride length, Decreased dorsiflexion - right, Staggering left, Staggering right, Drifts right/left Gait velocity interpretation: Below normal speed for age/gender General Gait Details: Pt with decreased foot clearance RLE with a few instances of tripping over foot when fatigued. Downward gaze during gait. Cues to slow down and for straight gaze. Very unsteady- needing constant Min  A for support/safety.     ADL:    Cognition: Cognition Overall Cognitive Status: Difficult to assess Arousal/Alertness: Awake/alert Orientation Level: Oriented to person, Oriented to place, Disoriented to time, Disoriented to situation Attention: Focused, Sustained Focused Attention: Appears intact Sustained Attention: Appears intact Memory: Appears intact (Similar to baseline) Awareness: Impaired Awareness Impairment: Emergent impairment, Anticipatory impairment Executive Function: Self Monitoring, Sequencing Sequencing: Appears intact Self Monitoring: Impaired Self Monitoring Impairment: Verbal basic Behaviors: Impulsive Safety/Judgment: Impaired Cognition Arousal/Alertness: Awake/alert Behavior During Therapy: WFL for tasks assessed/performed Overall Cognitive Status: Difficult to assess Difficult to assess due to: Non-English speaking  Blood pressure 134/76, pulse 78, temperature 98.4 F (36.9 C), temperature source Oral, resp. rate 16, height 5\' 1"  (1.549 m), weight 60.6 kg (133 lb 9.6 oz), last menstrual period 12/23/2014, SpO2 94 %. Physical Exam  HENT:  Head: Normocephalic.  Eyes: EOM are normal.  Neck: Normal range of motion. Neck supple. No thyromegaly present.  Cardiovascular: Normal rate, regular rhythm and normal heart sounds.  Respiratory: Effort normal and breath sounds normal. No respiratory distress. She has no wheezes.  GI: Bowel sounds are normal. She exhibits no distension.  Musculoskeletal: She exhibits edema.  Neurological: She is alert.  She did follow simple demonstrated commands however exam limited by non-English speaking. Friend interprets---has fair insight. RUE grossly 3 to 3+/5. RLE 2+hf, 3- k3 and 3/5 ankle. LUE and LLE grossly 4 to 4+/5. Sensation grossly intact right arm/leg but seems to have slightly diminished LT.  Skin: Skin is warm and dry.  Psychiatric:  Limited cognitively to basic insight/awareness     Lab Results Last 24 Hours    Results for orders  placed or performed during the hospital encounter of 01/23/15 (from the past 24 hour(s))  Glucose, capillary Status: Abnormal   Collection Time: 01/25/15 12:51 PM  Result Value Ref Range   Glucose-Capillary 143 (H) 70 - 99 mg/dL  Glucose, capillary Status: Abnormal   Collection Time: 01/25/15 4:39 PM  Result Value Ref Range   Glucose-Capillary 209 (H) 70 - 99 mg/dL  Glucose, capillary Status: Abnormal   Collection Time: 01/25/15 7:56 PM  Result Value Ref Range   Glucose-Capillary 250 (H) 70 - 99 mg/dL   Comment 1 Notify RN    Comment 2 Document in Chart   Glucose, capillary Status: Abnormal   Collection Time: 01/25/15 9:43 PM  Result Value Ref Range   Glucose-Capillary 204 (H) 70 - 99 mg/dL   Comment 1 Notify RN    Comment 2 Document in Chart   CBC Status: Abnormal   Collection Time: 01/26/15 5:54 AM  Result Value Ref Range   WBC 11.9 (H) 4.0 - 10.5 K/uL   RBC 3.77 (L) 3.87 - 5.11 MIL/uL   Hemoglobin 11.9 (L) 12.0 - 15.0 g/dL   HCT 16.136.6 09.636.0 - 04.546.0 %   MCV 97.1 78.0 - 100.0 fL   MCH 31.6 26.0 - 34.0 pg   MCHC 32.5 30.0 - 36.0 g/dL   RDW 40.916.4 (H) 81.111.5 - 91.415.5 %   Platelets 424 (H) 150 - 400 K/uL  Renal function panel Status: Abnormal   Collection Time: 01/26/15 5:54 AM  Result Value Ref Range   Sodium 135 135 - 145 mmol/L   Potassium 5.3 (H) 3.5 - 5.1 mmol/L   Chloride 97 96 - 112 mmol/L   CO2 26 19 - 32 mmol/L   Glucose, Bld 142 (H) 70 - 99 mg/dL   BUN 33 (H) 6 - 23 mg/dL   Creatinine,  Ser 6.25 (H) 0.50 - 1.10 mg/dL   Calcium 9.2 8.4 - 16.1 mg/dL   Phosphorus 5.7 (H) 2.3 - 4.6 mg/dL   Albumin 3.4 (L) 3.5 - 5.2 g/dL   GFR calc non Af Amer 7 (L) >90 mL/min   GFR calc Af Amer 9 (L) >90 mL/min   Anion gap 12 5 - 15  Glucose, capillary Status: Abnormal   Collection Time: 01/26/15  6:31 AM  Result Value Ref Range   Glucose-Capillary 147 (H) 70 - 99 mg/dL   Comment 1 Notify RN    Comment 2 Document in Chart       Imaging Results (Last 48 hours)    No results found.    Assessment/Plan: Diagnosis: left paramedian pontine infarct 1. Does the need for close, 24 hr/day medical supervision in concert with the patient's rehab needs make it unreasonable for this patient to be served in a less intensive setting? Yes 2. Co-Morbidities requiring supervision/potential complications: dm, esrd, brain 3. Due to bladder management, bowel management, safety, skin/wound care, disease management, medication administration, pain management and patient education, does the patient require 24 hr/day rehab nursing? Yes 4. Does the patient require coordinated care of a physician, rehab nurse, PT (1-2 hrs/day, 5 days/week), OT (1-2 hrs/day, 5 days/week) and SLP (1-2 hrs/day, 5 days/week) to address physical and functional deficits in the context of the above medical diagnosis(es)? Yes Addressing deficits in the following areas: balance, endurance, locomotion, strength, transferring, bowel/bladder control, bathing, dressing, feeding, grooming, toileting, cognition, swallowing and psychosocial support 5. Can the patient actively participate in an intensive therapy program of at least 3 hrs of therapy per day at least 5 days per week? Yes 6. The potential for patient to make measurable gains while on inpatient rehab is good 7. Anticipated functional outcomes upon discharge from inpatient rehab are supervision with PT, supervision with OT, modified independent and supervision with SLP. 8. Estimated rehab length of stay to reach the above functional goals is: 7-10 days 9. Does the patient have adequate social supports and living environment to accommodate these discharge functional goals? Potentially 10. Anticipated D/C setting: Home 11. Anticipated post D/C treatments: HH therapy and  Outpatient therapy 12. Overall Rehab/Functional Prognosis: excellent  RECOMMENDATIONS: This patient's condition is appropriate for continued rehabilitative care in the following setting: CIR Patient has agreed to participate in recommended program. Potentially Note that insurance prior authorization may be required for reimbursement for recommended care.  Comment: Need to establish social supports. Friends/family need to organize at least initial 24 supervision. Rehab Admissions Coordinator to follow up.  Thanks,  Ranelle Oyster, MD, [facility]     01/26/2015       Revision History     Date/Time User Provider Type Action   01/26/2015 10:23 AM Ranelle Oyster, MD Physician Sign   01/26/2015 10:10 AM Charlton Amor, PA-C Physician Assistant Pend   View Details Report       Routing History     Date/Time From To Method   01/26/2015 10:23 AM Ranelle Oyster, MD Ranelle Oyster, MD In Basket

## 2015-02-02 ENCOUNTER — Inpatient Hospital Stay (HOSPITAL_COMMUNITY): Payer: Self-pay

## 2015-02-02 ENCOUNTER — Inpatient Hospital Stay (HOSPITAL_COMMUNITY): Payer: Self-pay | Admitting: Speech Pathology

## 2015-02-02 ENCOUNTER — Inpatient Hospital Stay (HOSPITAL_COMMUNITY): Payer: Self-pay | Admitting: Occupational Therapy

## 2015-02-02 ENCOUNTER — Inpatient Hospital Stay (HOSPITAL_COMMUNITY): Payer: Self-pay | Admitting: Rehabilitation

## 2015-02-02 DIAGNOSIS — G8190 Hemiplegia, unspecified affecting unspecified side: Secondary | ICD-10-CM

## 2015-02-02 LAB — CBC
HCT: 26.1 % — ABNORMAL LOW (ref 36.0–46.0)
HEMOGLOBIN: 8.7 g/dL — AB (ref 12.0–15.0)
MCH: 31.8 pg (ref 26.0–34.0)
MCHC: 33.3 g/dL (ref 30.0–36.0)
MCV: 95.3 fL (ref 78.0–100.0)
Platelets: 670 10*3/uL — ABNORMAL HIGH (ref 150–400)
RBC: 2.74 MIL/uL — ABNORMAL LOW (ref 3.87–5.11)
RDW: 16.7 % — ABNORMAL HIGH (ref 11.5–15.5)
WBC: 15.8 10*3/uL — AB (ref 4.0–10.5)

## 2015-02-02 LAB — RENAL FUNCTION PANEL
ANION GAP: 18 — AB (ref 5–15)
Albumin: 3.5 g/dL (ref 3.5–5.2)
BUN: 69 mg/dL — AB (ref 6–23)
CO2: 21 mmol/L (ref 19–32)
Calcium: 9 mg/dL (ref 8.4–10.5)
Chloride: 92 mmol/L — ABNORMAL LOW (ref 96–112)
Creatinine, Ser: 10.83 mg/dL — ABNORMAL HIGH (ref 0.50–1.10)
GFR calc Af Amer: 4 mL/min — ABNORMAL LOW (ref >90)
GFR calc non Af Amer: 4 mL/min — ABNORMAL LOW (ref >90)
Glucose, Bld: 155 mg/dL — ABNORMAL HIGH (ref 70–99)
PHOSPHORUS: 3.8 mg/dL (ref 2.3–4.6)
Potassium: 4.8 mmol/L (ref 3.5–5.1)
SODIUM: 131 mmol/L — AB (ref 135–145)

## 2015-02-02 LAB — GLUCOSE, CAPILLARY
GLUCOSE-CAPILLARY: 178 mg/dL — AB (ref 70–99)
Glucose-Capillary: 125 mg/dL — ABNORMAL HIGH (ref 70–99)
Glucose-Capillary: 140 mg/dL — ABNORMAL HIGH (ref 70–99)
Glucose-Capillary: 133 mg/dL — ABNORMAL HIGH (ref 70–99)

## 2015-02-02 MED ORDER — HEPARIN SODIUM (PORCINE) 1000 UNIT/ML DIALYSIS: 3500.0000 [IU] | Freq: Once | INTRAMUSCULAR | Status: DC

## 2015-02-02 MED ORDER — LIDOCAINE-PRILOCAINE 2.5-2.5 % EX CREA
1.0000 "application " | TOPICAL_CREAM | CUTANEOUS | Status: DC | PRN
  Filled 2015-02-02: qty 5

## 2015-02-02 MED ORDER — INSULIN GLARGINE 100 UNIT/ML ~~LOC~~ SOLN
5.0000 [IU] | Freq: Every day | SUBCUTANEOUS | Status: DC
  Administered 2015-02-03: 5 [IU] via SUBCUTANEOUS
  Filled 2015-02-02 (×2): qty 0.05

## 2015-02-02 MED ORDER — SODIUM CHLORIDE 0.9 % IV SOLN: 100.0000 mL | INTRAVENOUS | Status: DC | PRN

## 2015-02-02 MED ORDER — PENTAFLUOROPROP-TETRAFLUOROETH EX AERO: 1.0000 "application " | INHALATION_SPRAY | CUTANEOUS | Status: DC | PRN

## 2015-02-02 MED ORDER — NEPRO/CARBSTEADY PO LIQD
237.0000 mL | ORAL | Status: DC | PRN
  Filled 2015-02-02: qty 237

## 2015-02-02 MED ORDER — HEPARIN SODIUM (PORCINE) 1000 UNIT/ML DIALYSIS: 1000.0000 [IU] | INTRAMUSCULAR | Status: DC | PRN

## 2015-02-02 MED ORDER — LIDOCAINE HCL (PF) 1 % IJ SOLN: 5.0000 mL | INTRAMUSCULAR | Status: DC | PRN

## 2015-02-02 MED ORDER — ALTEPLASE 2 MG IJ SOLR
2.0000 mg | Freq: Once | INTRAMUSCULAR | Status: DC | PRN
  Filled 2015-02-02: qty 2

## 2015-02-02 MED ORDER — MUSCLE RUB 10-15 % EX CREA
TOPICAL_CREAM | Freq: Three times a day (TID) | CUTANEOUS | Status: DC
  Administered 2015-02-02 – 2015-02-05 (×7): via TOPICAL
  Administered 2015-02-05: 1 via TOPICAL
  Administered 2015-02-06 – 2015-02-11 (×15): via TOPICAL
  Administered 2015-02-11: 1 via TOPICAL
  Filled 2015-02-02 (×2): qty 85

## 2015-02-02 MED ORDER — TROLAMINE SALICYLATE 10 % EX CREA
TOPICAL_CREAM | Freq: Three times a day (TID) | CUTANEOUS | Status: DC
  Filled 2015-02-02: qty 85

## 2015-02-02 MED ORDER — GLIPIZIDE 5 MG PO TABS
5.0000 mg | ORAL_TABLET | Freq: Every day | ORAL | Status: DC
  Administered 2015-02-03 – 2015-02-05 (×3): 5 mg via ORAL
  Filled 2015-02-02 (×4): qty 1

## 2015-02-02 NOTE — Progress Notes (Signed)
44 y.o. RH-non english speaking female with history of hypertension,mild MR, ESRD-HD TTS who was admitted on 01/23/2015 with difficulty swallowing with drooling, dysarthria, R-sided weakness with increased difficulty walking. MRI of the brain showed acute brainstem infarct, left paracentral pons lacune with no mass effect as well as chronic lacunar infarcts of the right pons and left basal ganglia corona radiata. Carotid Dopplers without significant ICA stenosis. 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   Subjective/Complaints:   Objective: Vital Signs: Blood pressure 125/64, pulse 76, temperature 97.6 F (36.4 C), temperature source Oral, resp. rate 16, weight 59.5 kg (131 lb 2.8 oz), last menstrual period 12/23/2014, SpO2 99 %. Dg Chest 2 View  01/31/2015   CLINICAL DATA:  Cough. History of hypertension and diabetes. Initial encounter.  EXAM: CHEST  2 VIEW  COMPARISON:  Radiographs 01/23/2015 and 01/27/2015. Abdominal CT 01/29/2015.  FINDINGS: The heart size and mediastinal contours appear stable. There is mild central airway thickening without hyperinflation, confluent airspace opacity or pleural effusion. Contrast material is noted within the colon from the recent CT. The bones appear unremarkable. Telemetry leads overlie the chest.  IMPRESSION: Mild central airway thickening suggesting bronchitis. No evidence of pneumonia or edema.   Electronically Signed   By: Carey Bullocks M.D.   On: 01/31/2015 14:53   Results for orders placed or performed during the hospital encounter of 02/01/15 (from the past 72 hour(s))  Glucose, capillary     Status: Abnormal   Collection Time: 02/01/15  4:57 PM  Result Value Ref Range   Glucose-Capillary 153 (H) 70 - 99 mg/dL   Comment 1 Notify RN   CBC     Status: Abnormal   Collection Time: 02/01/15  6:28 PM  Result Value Ref Range   WBC 15.2 (H) 4.0 - 10.5 K/uL   RBC 2.98  (L) 3.87 - 5.11 MIL/uL   Hemoglobin 9.4 (L) 12.0 - 15.0 g/dL   HCT 95.6 (L) 21.3 - 08.6 %   MCV 96.0 78.0 - 100.0 fL   MCH 31.5 26.0 - 34.0 pg   MCHC 32.9 30.0 - 36.0 g/dL   RDW 57.8 (H) 46.9 - 62.9 %   Platelets 562 (H) 150 - 400 K/uL  Glucose, capillary     Status: Abnormal   Collection Time: 02/01/15  9:56 PM  Result Value Ref Range   Glucose-Capillary 231 (H) 70 - 99 mg/dL  Glucose, capillary     Status: Abnormal   Collection Time: 02/02/15  7:06 AM  Result Value Ref Range   Glucose-Capillary 125 (H) 70 - 99 mg/dL     HEENT: normal Cardio: RRR Resp: CTA B/L and unlabored GI: BS positive, Tender and NT,ND Extremity:  Pulses positive and No Edema Skin:   Intact Neuro: Alert/Oriented, Normal Motor and Abnormal Motor 3- R delt bi tri, 2- grip, 3- HF, KE 2- ankle Musc/Skel:  Other no pain with UE and LE ROM, No joint swelling Gen NAD   Assessment/Plan: 1. 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 Physiatrist is providing close team supervision and 24 hour management of active medical problems listed below. Physiatrist and rehab team continue to assess barriers to discharge/monitor patient progress toward functional and medical goals. FIM:       FIM - Toileting Toileting steps completed by patient: Performs perineal Biomedical engineer  Devices: Grab bar or rail for support Toileting: 4: Steadying assist  FIM - Diplomatic Services operational officerToilet Transfers Toilet Transfers Assistive Devices: Therapist, musicGrab bars Toilet Transfers: 4-To toilet/BSC: Min A (steadying Pt. > 75%), 4-From toilet/BSC: Min A (steadying Pt. > 75%)  FIM - Bed/Chair Transfer Bed/Chair Transfer Assistive Devices: Bed rails Bed/Chair Transfer: 4: Bed > Chair or W/C: Min A (steadying Pt. > 75%), 4: Chair or W/C > Bed: Min A (steadying Pt. > 75%)                    Medical  Problem List and Plan: 1. 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 DVT Prophylaxis/Anticoagulation: Pharmaceutical: Lovenox 3. Pain Management: tylenol 4. Mood: team to provide egosupport as possible 5. Neuropsych: This patient is not capable of making decisions on her own behalf. 6. Skin/Wound Care: encourage adequate nutrition, pressure relief,  7. Fluids/Electrolytes/Nutrition: renal diet 1200cc FR 8. DM type 2: lantus insulin with SSI 9. ESRD: HD per renal service, dialysis to occur after therapy completed, TTS schedule 10. Dysphagia: tolerating regular consistency diet at present 11. HTN: bp controlled at present, volume mgt with HD 12. Anemia of chronic disease: aranesp  LOS (Days) 1 A FACE TO FACE EVALUATION WAS PERFORMED  KIRSTEINS,ANDREW E 02/02/2015, 7:25 AM

## 2015-02-02 NOTE — Evaluation (Signed)
Speech Language Pathology Assessment and Plan  Patient Details  Name: Natalie [name] MRN: [id number] Date of Birth: Jun 14, 1971  SLP Diagnosis: Dysarthria;Dysphagia;Cognitive Impairments  Rehab Potential: Good ELOS: 10-12 days     Today's Date: 02/02/2015 SLP Individual Time: 4098-1191 SLP Individual Time Calculation (min): 60 min   Problem List:  Patient Active Problem List   Diagnosis Date Noted  . Weakness   . Life long cognitive dysfunction 01/24/2015  . Hypothyroidism 01/24/2015  . Hyperlipidemia 01/24/2015  . Anemia 01/24/2015  . Hyperkalemia 01/24/2015  . Leukocytosis 01/24/2015  . UTI (urinary tract infection) 01/24/2015  . Brainstem stroke 01/23/2015  . ESRD (end stage renal disease) 01/23/2015  . Diabetes 01/23/2015  . CVA (cerebral infarction) 01/23/2015   Past Medical History:  Past Medical History  Diagnosis Date  . Hypertension   . Diabetes mellitus without complication   . Hyperlipidemia 01/24/2015  . ESRD on hemodialysis started 02/25/2013    [facility] -   . Anemia of chronic disease   . Secondary hyperparathyroidism    Past Surgical History:  Past Surgical History  Procedure Laterality Date  . Insertion of dialysis catheter      Assessment / Plan / Recommendation Clinical Impression   Natalie [name] is a 44 y.o. [place] speaking female with history of hypertension,mild MR, ESRD-HD TTS who was admitted on 01/23/2015 with difficulty swallowing with drooling, dysarthria, R-sided weakness with increased difficulty walking. MRI of the brain showed acute brainstem infarct, left paracentral pons lacune with no mass effect as well as chronic lacunar infarcts of the right pons and left basal ganglia corona radiata.  Swallow evaluation done and patient placed on dysphagia 3 diet with nectar liquids. Upgraded to thin liquids with continued dys 3 textures at bedside. Patient with right sided weakness with foot drop affecting safety with mobility as  well as impusivity. CIR recommended by MD and Rehab team.  Pt admitted to CIR on 02/01/2015.  SLP evaluation completed on 02/02/2015 with the following results:  Pt presents with s/s of a mild, reversible neurogenic dysphagia based primarily in oral motor deficits.  Pt presents with right sided labial, lingual, and buccal weakness which result in prolonged but efficient mastication of solid textures with minimal residuals left in the oral cavity post swallow.  Top dentures in place for evaluation.  Pt also demonstrated immediate, strong reflexive cough on initial straw sip of thin liquids only, no further s/s of aspiration noted with further trials.  Recommend that pt remain on dys 3 textures and thin liquids with full supervision due to lethargy and cognitive deficits.   Additionally, pt presents with a moderately severe dysarthria characterized by decreased vocal intensity, imprecise articulation of consonants and rapid rushes of speech which impact her speech intelligibility in words/phrases.  Unclear of pt's cognitive baseline as no family was present to verify; however, pt presents with delayed processing of information, decreased short term memory, and decreased functional problem solving.   Pt would benefit from skilled ST while inpatient in order to maximize functional independence and reduce burden of care prior to discharge.  Anticipate that pt will need 24/7 supervision, assistance for medication and financial management, and potential ST follow up in either the home health or outpatient setting.    Skilled Therapeutic Interventions          Cognitive-linguistic and bedside swallowing evaluation completed with results and recommendations reviewed with patient.  Pt required mod instructional cues for use of dysarthria strategies.  Handout left in  pt's room to facilitate carryover of strategies in between therapy sessions.      SLP Assessment  Patient will need skilled Speech Lanaguage Pathology  Services during CIR admission    Recommendations  Diet Recommendations: Dysphagia 3 (Mechanical Soft);Thin liquid Liquid Administration via: Cup;Straw Medication Administration: Whole meds with puree Supervision: Full supervision/cueing for compensatory strategies Compensations: Slow rate;Small sips/bites;Check for pocketing Postural Changes and/or Swallow Maneuvers: Upright 30-60 min after meal;Seated upright 90 degrees Oral Care Recommendations: Oral care BID Patient destination: Home Follow up Recommendations: Home Health SLP;24 hour supervision/assistance;Outpatient SLP Equipment Recommended: None recommended by SLP    SLP Frequency 3 to 5 out of 7 days   SLP Treatment/Interventions Cueing hierarchy;Cognitive remediation/compensation;Dysphagia/aspiration precaution training;Patient/family education;Oral motor exercises;Internal/external aids;Environmental controls    Pain Pain Assessment Pain Assessment: No/denies pain Prior Functioning Cognitive/Linguistic Baseline: Baseline deficits Baseline deficit details: Pt has learning disability. Difficulty reading. Poor memory. Type of Home: Mobile home  Lives With: Family (sister) Available Help at Discharge: Family Vocation: Unemployed  Short Term Goals: Week 1: SLP Short Term Goal 1 (Week 1): Pt will use increased vocal intensity and slow rate to achieve intelligibiltiy at the phrase/sentence level with min verbal and visual cues.   SLP Short Term Goal 2 (Week 1): Pt will recall at least 2 dysarthria strategies with min verbal and visual cues over 2 consecutive sessions.  SLP Short Term Goal 3 (Week 1): Pt will return demonstration of diaphragmatic breathing exercises to improve breath support for speech with min instructional cues over 2 consecutive sessions.  SLP Short Term Goal 4 (Week 1): Pt will consume dys 3 textures and thin liquids with supervision cues for use of lingual sweep and liquid wash to clear right sided buccal  residue over 3 consecutive sessions prior to diet advancement.      See FIM for current functional status Refer to Care Plan for Long Term Goals  Recommendations for other services: None  Discharge Criteria: Patient will be discharged from SLP if patient refuses treatment 3 consecutive times without medical reason, if treatment goals not met, if there is a change in medical status, if patient makes no progress towards goals or if patient is discharged from hospital.  The above assessment, treatment plan, treatment alternatives and goals were discussed and mutually agreed upon: by patient  Emilio Math 02/02/2015, 3:46 PM

## 2015-02-02 NOTE — Progress Notes (Signed)
Social Work Assessment and Plan Social Work Assessment and Plan  Patient Details  Name: Natalie [name] MRN: [id number] Date of Birth: 02-04-1971  Today's Date: 02/02/2015  Problem List:  Patient Active Problem List   Diagnosis Date Noted  . Weakness   . Life long cognitive dysfunction 01/24/2015  . Hypothyroidism 01/24/2015  . Hyperlipidemia 01/24/2015  . Anemia 01/24/2015  . Hyperkalemia 01/24/2015  . Leukocytosis 01/24/2015  . UTI (urinary tract infection) 01/24/2015  . Brainstem stroke 01/23/2015  . ESRD (end stage renal disease) 01/23/2015  . Diabetes 01/23/2015  . CVA (cerebral infarction) 01/23/2015   Past Medical History:  Past Medical History  Diagnosis Date  . Hypertension   . Diabetes mellitus without complication   . Hyperlipidemia 01/24/2015  . ESRD on hemodialysis started 02/25/2013    [facility] -   . Anemia of chronic disease   . Secondary hyperparathyroidism    Past Surgical History:  Past Surgical History  Procedure Laterality Date  . Insertion of dialysis catheter     Social History:  reports that she has never smoked. She does not have any smokeless tobacco history on file. She reports that she does not drink alcohol or use illicit drugs.  Family / Support Systems Marital Status: Single Patient Roles: Other (Comment), Caregiver (Sister) Other Supports: Martha-sister  [name]-niece  [phone]   [name]-nephew- [phone] Anticipated Caregiver: Johnny Bridge will be pt's primary caregiver and she will get some help with neighbors in the community.  May be able to get someone in exchange for room and board to assist with pt's care and martha's daughter Ability/Limitations of Caregiver: Johnny Bridge has health issues of her own and is the primary caregiver to her own daughter with Down's syndrome-which requires 24 hr physical care-she does ambulate according to [name] Caregiver Availability: 24/7 Family Dynamics: Close knit family Johnny Bridge has taken care of pt  since she was small child.  Pt moved in with sister when she started having kidney issues while up in [place] last year.  Pt's mom died when she was 69 yo and her older sister( martha) took care of her, she did not attend school. Niece feels she has special needs like her sister.  Social History Preferred language: Spanish Religion:  Cultural Background: From [place]-niece unsure when came here and pt only speaks her native language-spanish.  Had interpreter but does not know the answer to many questions. Education: Did not attend school accoridng to niece Read: No Write: No Employment Status: Unemployed Date Retired/Disabled/Unemployed: Unable to work due to HD did not have a job prior to dialysis either Fish farm manager Issues: unsure if pt is here legally-so will not be eligible for services if not Guardian/Conservator: None-according to MD pt is not capable of making her own decisions while here.  She has no children will look toward her sister but also her niece since sister speaks no [place] and niece does and can assist with decision making.  WIll make sure sister is included in decision making if needed while here.   Abuse/Neglect Physical Abuse: Denies Verbal Abuse: Denies Sexual Abuse: Denies Exploitation of patient/patient's resources: Denies Self-Neglect: Denies  Emotional Status Pt's affect, behavior adn adjustment status: Pt is tired from therapies, she wants to get back to how she was.  She complains of her head and back hurting, but feels her back hurt before this.  She is willing to try and work in therapies and improve.  Interpreter was present to obtain information.  Niece reports she  was independent prior to admission and helped her Mom with the care of her sister. Recent Psychosocial Issues: Other health issues-HD patient, has no health insurance and difficulty paying for medicines, niece reprots her brother was paying for them and the household bills  also Pyschiatric History: No history-question if pt mild MR or lack of formal education.  Niece reports no psych issues.  Will see if would benefit from neuro-psych services while here, assist with coping. Substance Abuse History: None  Patient / Family Perceptions, Expectations & Goals Pt/Family understanding of illness & functional limitations: Niece has a good understanding of her Aunt's stroke and deficts.  She questions if pt fully understands what happened to her.  She feels her Celine Ahrunt is calmer when she is here, she provides security.  She tries to visit daily and brings her Mom when she can.  Niece asks questions and feels her concerns are being addressed. Premorbid pt/family roles/activities: Aunt, cousin. sister, friend, etc Anticipated changes in roles/activities/participation: resume Pt/family expectations/goals: Pt states: " So tired, need to go to sleep."  Niece states: " I want her to be able to move around and do some for herself by the time she leaves here."  [employer] IncCommunity Resources Community Agencies: Other (Comment) ([facility] T, Th & Sat) Premorbid Home Care/DME Agencies: None Transportation available at discharge: Family and friends provide this Resource referrals recommended: Support group (specify), Neuropsychology  Discharge Planning Living Arrangements: Other relatives Support Systems: Other relatives, Manufacturing engineerriends/neighbors, Psychologist, clinicalChurch/faith community Type of Residence: Private residence Insurance Resources: Customer service managerelf-pay Financial Resources: Family Support Financial Screen Referred: Yes Living Expenses: Lives with family Money Management: Family Does the patient have any problems obtaining your medications?: Yes (Describe) (No insurance to assist with this) Home Management: She and her sister Patient/Family Preliminary Plans: Return home wiht her sister and niece, sister-martha will be pt's primary caregiver along wiht the caregiver of her 45 yo daughter with Down's syndrome.   [name]-niece here to make sure services are set up and applied for and to help her mom-martha. Pt needs to be as high  level as possible before going home. Social Work Anticipated Follow Up Needs: HH/OP, Other (comment) (Setting up PCP and assist with meds)  Clinical Impression Quiet pt who allows niece to answer questions even when asked with interpreter present-child like in her behaviors. Niece wants to apply for whatever pt may be eligible for while here.  Pt and her sister-Martha need a PCP to follow them and provide medication assistance. Ramp built by family members already-last weekend. Unsure if pt eligible due to her legal status in [place].  Will work with on eligibility for community services and work with family on discharge Plans.  Niece to stay here past Thursday to assist with plans. Somewhat complicated social situation along with the speech barrier makes it difficult to obtain information and formulate a plan. Will work also with HD social worker-Daniel to Make sure follow through and see if any other services eligible for.  Lucy Chrisupree, Norman Piacentini G 02/02/2015, 3:20 PM

## 2015-02-02 NOTE — Progress Notes (Signed)
Social Work Patient ID: Natalie [name], female   DOB: Nov 19, 1970, 44 y.o.   MRN: [id number] Met with treatment team and they recommend interpreter needs to be arranged for 10;00-12;00 and 1;00-2;30 pm daily. Will arrange for tomorrow and the rest of the week.

## 2015-02-02 NOTE — Progress Notes (Signed)
Patient off unit to dialysis.

## 2015-02-02 NOTE — Progress Notes (Signed)
Physical Therapy Assessment and Plan  Patient Details  Name: Natalie [name] MRN: [id number] Date of Birth: 1970/11/02  PT Diagnosis: Abnormal posture, Abnormality of gait, Cognitive deficits, Difficulty walking and Hemiparesis dominant Rehab Potential:   ELOS:  10-12  days   Today's Date: 02/03/2015 PT Individual Time:  - [phone]   Duration: 60 mins   Problem List:  Patient Active Problem List   Diagnosis Date Noted  . Weakness   . Life long cognitive dysfunction 01/24/2015  . Hypothyroidism 01/24/2015  . Hyperlipidemia 01/24/2015  . Anemia 01/24/2015  . Hyperkalemia 01/24/2015  . Leukocytosis 01/24/2015  . UTI (urinary tract infection) 01/24/2015  . Brainstem stroke 01/23/2015  . ESRD (end stage renal disease) 01/23/2015  . Diabetes 01/23/2015  . CVA (cerebral infarction) 01/23/2015    Past Medical History:  Past Medical History  Diagnosis Date  . Hypertension   . Diabetes mellitus without complication   . Hyperlipidemia 01/24/2015  . ESRD on hemodialysis started 02/25/2013    [facility] [place] -   . Anemia of chronic disease   . Secondary hyperparathyroidism    Past Surgical History:  Past Surgical History  Procedure Laterality Date  . Insertion of dialysis catheter      Assessment & Plan Clinical Impression: Natalie [name] is a 44 y.o. [place] speaking female with history of hypertension,mild MR, ESRD-[facility] TTS who was admitted on 01/23/2015 with difficulty swallowing with drooling, dysarthria, R-sided weakness with increased difficulty walking. MRI of the brain showed acute brainstem infarct, left paracentral pons lacune with no mass effect as well as chronic lacunar infarcts of the right pons and left basal ganglia corona radiata. Carotid Dopplers without significant ICA stenosis. 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 She was noted to have leucocytosis likely due to  UTI and was placed on Cipro for treatment. TSH elevated and synthroid added for supplement. Swallow evaluation done and patient placed on dysphagia 3 diet with nectar liquids. Cognitive evaluation reveals patient at baseline. Dr. Erlinda Hong recommended ASA for left paracentral stroke due to small vessel disease. Patient has had ongoing issues with abdominal pain and CT abdomen/pelvis 4/1 revealed hepatic steatosis and bilateral renal atrophy--no gross abnormality. Abdominal pain improved past treatment of constipation. Patient has had reactive leucocytosis with cough, fever and elevated procalcitonin due to bronchitis and was started on Levaquin for treatment yesterday. Patient with right sided weakness with foot drop affecting safety with mobility as well as impusivity. CIR recommended by MD and Rehab team.   Patient currently requires min with mobility secondary to muscle weakness, impaired timing and sequencing, decreased coordination and decreased motor planning and decreased initiation, decreased attention, decreased awareness, decreased problem solving, decreased safety awareness and delayed processing.  Prior to hospitalization, patient was independent  with mobility and lived with Family (sister) in a Mobile home home.  Home access is  Ramped entrance.  Patient will benefit from skilled PT intervention to maximize safe functional mobility, minimize fall risk and decrease caregiver burden for planned discharge home with 24 hour supervision.  Anticipate patient will benefit from follow up [place] at discharge.  PT - End of Session Activity Tolerance: Tolerates < 10 min activity, no significant change in vital signs Endurance Deficit: Yes Endurance Deficit Description: Pt given several breaks during session. PT Assessment Rehab Potential (ACUTE/IP ONLY): Good PT Patient demonstrates impairments in the following area(s): Balance;Endurance;Motor;Safety PT Transfers Functional Problem(s): Bed Mobility;Bed to  Chair;Car;Furniture;Floor PT  Locomotion Functional Problem(s): Ambulation;Stairs PT Plan PT Intensity: Minimum of 1-2 x/day ,45 to 90 minutes PT Frequency: 5 out of 7 days PT Duration Estimated Length of Stay: 10-12 PT Treatment/Interventions: Ambulation/gait training;Balance/vestibular training;Discharge planning;DME/adaptive equipment instruction;Functional mobility training;Neuromuscular re-education;Patient/family education;Therapeutic Exercise;Therapeutic Activities;UE/LE Strength taining/ROM;Stair training;UE/LE Coordination activities PT Transfers Anticipated Outcome(s): Supervision PT Locomotion Anticipated Outcome(s): Supervision at an ambulatory level PT Recommendation Follow Up Recommendations: Home health PT;24 hour supervision/assistance Patient destination: Home Equipment Recommended: Rolling walker with 5" wheels;To be determined  Skilled Therapeutic Intervention PT evaluation performed. See below for detailed findings. Treatment initiated. Session focused on bed mobility, bed <>w/c transfers, ambulation, and stairs. See below for detailed description of assist/cueing required with said aspects of functional mobility. Educated pt on findings, goals, and plan of care via translator. Oriented pt to rehab unit, fall precautions. Pt will likely require reinforcement. Educated interpretor to speak with pt regarding need for 24/7 S at time of D/C for safety.  No family present at this time, therefore will continue to follow to ensure she has 24/7. Pt left in w/c in room with all needs within reach.   PT Evaluation Precautions/Restrictions Precaution: Fall  Weight bearing restrictions: None General Chart Reviewed: Yes Family/Caregiver Present: No  Vital SignsTherapy Vitals Temp: 98.5 F (36.9 C) Temp Source: Oral Pulse Rate: 93 Resp: 18 BP: 135/74 mmHg Patient Position (if appropriate): Lying Oxygen Therapy SpO2: 98 % O2 Device: Not Delivered Pain Pain Assessment Pain  Assessment: No/denies pain Home Living/Prior Functioning (see navigator for full details) Home Living Available Help at Discharge: Family (sister and niece available 24/7 per pt report, will have to verify) Type of Home: Mobile home  Lives With: Family (sister ) Prior Function Vocation: Unemployed Vision/Perception   See OT note.  Cognition Overall Cognitive Status: History of cognitive impairments - at baseline (no family present to determine baseline) Arousal/Alertness: Awake/alert Orientation Level: Oriented X3 (unable to state that she had had a CVA) Attention: Sustained Sustained Attention: Appears intact Memory: Appears intact (similar to baseline, unsure due to no family present ) Awareness: Impaired Awareness Impairment: Emergent impairment Executive Function: Self Monitoring;Sequencing Sequencing: Appears intact Self Monitoring: Impaired Self Monitoring Impairment: Verbal basic (suspect similar to baseline) Behaviors: Impulsive Safety/Judgment: Impaired Comments: quick release belt  Sensation Sensation Light Touch: Appears Intact Proprioception: Impaired by gross assessment Additional Comments: Proprioception impaired on R and L LE  Coordination Gross Motor Movements are Fluid and Coordinated: No (Pt required additional assistance during bed mobility, transfers, gait, and stairs) Motor  Motor Motor: Other (comment) (R UE/LE weakness; hemiparesis)  Mobility Bed Mobility Bed Mobility: Rolling Right;Supine to Sit;Sit to Supine;Scooting to [facility] Rolling Right: 5: Supervision Right Sidelying to Sit: 5: Supervision;HOB flat Supine to Sit: 2: Max assist;HOB flat Supine to Sit Details: Verbal cues for technique;Verbal cues for sequencing;Tactile cues for initiation Sit to Supine: 5: Supervision;HOB flat Sit to Supine - Details: Verbal cues for technique;Verbal cues for sequencing Scooting to HOB: 5: Supervision;5: Set up Scooting to [facility] Details: Verbal cues for  sequencing;Verbal cues for technique Scooting to [facility] Details (indicate cue type and reason): Verbal cue to scoot EOB Transfers Transfers: Yes Stand to Sit: 4: Min assist;4: Min guard Stand to Sit Details (indicate cue type and reason): Tactile cues for initiation;Tactile cues for sequencing;Tactile cues for placement;Verbal cues for technique;Verbal cues for sequencing;Verbal cues for precautions/safety Stand Pivot Transfers: 4: Min assist;4: Min guard Stand Pivot Transfer Details: Tactile cues for sequencing;Tactile cues for placement;Verbal cues for technique;Verbal cues for sequencing;Verbal cues  for precautions/safety Locomotion  Ambulation Ambulation: Yes Ambulation/Gait Assistance: 4: Min assist Ambulation Distance (Feet): 10 Feet Assistive device: 1 person hand held assist;Other (Comment) (Hallway railing on L; HHA on R) Gait Gait: Yes Gait Pattern: Step-through pattern;Decreased step length - right;Decreased step length - left;Trunk flexed;Narrow base of support;Decreased hip/knee flexion - left;Decreased hip/knee flexion - right Stairs / Additional Locomotion Stairs: Yes Stairs Assistance: 4: Min assist Stairs Assistance Details: Tactile cues for initiation;Tactile cues for sequencing;Tactile cues for placement;Verbal cues for sequencing;Verbal cues for technique;Verbal cues for precautions/safety Stairs Assistance Details (indicate cue type and reason): Verbal/Tactile cues provided for sequencing, technique and safety due to impulsivity Stair Management Technique: Two rails;Step to pattern Number of Stairs: 4 Wheelchair Mobility Wheelchair Mobility: No  Trunk/Postural Assessment  Cervical Assessment Cervical Assessment: Within Functional Limits Thoracic Assessment Thoracic Assessment: Within Functional Limits Lumbar Assessment Lumbar Assessment: Within Functional Limits Postural Control Postural Control: Deficits on evaluation Postural Limitations: Pt has R UE/LE weakness  and requires HHA for balance during ambulation and stairs  Balance Balance Balance Assessed: Yes Static Sitting Balance Static Sitting - Balance Support: Bilateral upper extremity supported Static Sitting - Level of Assistance: 5: Stand by assistance Dynamic Sitting Balance Dynamic Sitting - Balance Support: During functional activity;Right upper extremity supported Dynamic Sitting - Level of Assistance: 4: Min assist Dynamic Sitting - Balance Activities: Lateral lean/weight shifting;Forward lean/weight shifting Dynamic Standing Balance Dynamic Standing - Balance Support: Right upper extremity supported;During functional activity Dynamic Standing - Level of Assistance: 4: Min assist Dynamic Standing - Balance Activities: Forward lean/weight shifting;Lateral lean/weight shifting Extremity Assessment  RLE Assessment (Difficult to formally assess due to language barrier) RLE Assessment: Exceptions to [facility] RLE Strength Right Hip Flexion: 3-/5 Right Knee Extension: 3+/5 Right Ankle Dorsiflexion: 3+/5 Right Ankle Plantar Flexion: 3-/5 LLE Assessment LLE Assessment: Exceptions to [facility] LLE Strength Left Hip Flexion: 4-/5 Left Knee Flexion: 3/5 Left Knee Extension: 4-/5 Left Ankle Dorsiflexion: 4-/5 Left Ankle Plantar Flexion: 3+/5  FIM:  FIM - Bed/Chair Transfer Bed/Chair Transfer: 4: Bed > Chair or W/C: Min A (steadying Pt. > 75%);4: Chair or W/C > Bed: Min A (steadying Pt. > 75%);3: Supine > Sit: Mod A (lifting assist/Pt. 50-74%/lift 2 legs;4: Sit > Supine: Min A (steadying pt. > 75%/lift 1 leg) FIM - Locomotion: Wheelchair Locomotion: Wheelchair: 0: Activity did not occur FIM - Locomotion: Ambulation Locomotion: Ambulation Assistive Devices: Other (comment) (R HHA, L hallway railing) Ambulation/Gait Assistance: 4: Min assist Locomotion: Ambulation: 1: Travels less than 50 ft with minimal assistance (Pt.>75%) FIM - Locomotion: Stairs Locomotion: Scientist, physiological: Hand rail -  2 Locomotion: Stairs: 2: Up and Down 4 - 11 stairs with minimal assistance (Pt.>75%)   Refer to Care Plan for Long Term Goals  Recommendations for other services: None  Discharge Criteria: Patient will be discharged from PT if patient refuses treatment 3 consecutive times without medical reason, if treatment goals not met, if there is a change in medical status, if patient makes no progress towards goals or if patient is discharged from hospital.  The above assessment, treatment plan, treatment alternatives and goals were discussed and mutually agreed upon: by patient  Denice Bors 02/03/2015, 8:23 AM

## 2015-02-02 NOTE — Care Management Note (Signed)
[facility] Individual Statement of Services  Patient Name:  Natalie [name]  Date:  02/02/2015  Welcome to the [facility].  Our goal is to provide you with an individualized program based on your diagnosis and situation, designed to meet your specific needs.  With this comprehensive rehabilitation program, you will be expected to participate in at least 3 hours of rehabilitation therapies Monday-Friday, with modified therapy programming on the weekends.  Your rehabilitation program will include the following services:  Physical Therapy (PT), Occupational Therapy (OT), Speech Therapy (ST), 24 hour per day rehabilitation nursing, Therapeutic Recreaction (TR), Case Management (Social Worker), Rehabilitation Medicine, Nutrition Services and Pharmacy Services  Weekly team conferences will be held on Wednesday to discuss your progress.  Your Social Worker will talk with you frequently to get your input and to update you on team discussions.  Team conferences with you and your family in attendance may also be held.  Expected length of stay: 10-12 days Overall anticipated outcome: supervision with set up/cueing  Depending on your progress and recovery, your program may change. Your Social Worker will coordinate services and will keep you informed of any changes. Your Social Worker's name and contact numbers are listed  below.  The following services may also be recommended but are not provided by the [facility]:    Home Health Rehabiltiation Services  Outpatient Rehabilitation Services    Arrangements will be made to provide these services after discharge if needed.  Arrangements include referral to agencies that provide these services.  Your insurance has been verified to be:  None Your primary doctor is:  [facility]  Pertinent information will be shared with your doctor and your insurance company.  Social Worker:  Dossie DerBecky  Tahj Lindseth, SW [phone] or [phone]) [phone]  Information discussed with and copy given to patient by: Lucy Chrisupree, Cabela Pacifico G, 02/02/2015, 2:48 PM

## 2015-02-02 NOTE — Progress Notes (Signed)
Patient information reviewed and entered into eRehab system by Sallye Lunz, RN, CRRN, PPS Coordinator.  Information including medical coding and functional independence measure will be reviewed and updated through discharge.    

## 2015-02-02 NOTE — Progress Notes (Signed)
Occupational Therapy Note  Patient Details  Name: Natalie [name] MRN: [id number] Date of Birth: 05-28-1971  Today's Date: 02/02/2015 OT Individual Time: 1330-1400 OT Individual Time Calculation (min): 30 min   Pt denied pain Individual Therapy  Pt resting in bed with niece present and acting as interpreter.  Pt required mod A for supine->sit EOB and min A for stand pivot transfer to recliner.  Pt engaged in reaching tasks with RUE to grasp items at varying heights, folding wash cloths, and crumpling paper.  Pt required max verbal cues to use RUE appropriately to complete tasks.  Pt issued resistive foam for patient to use in room with right hand.  Pt remained in recliner with niece present.   Lavone NeriLanier, Vernetta Dizdarevic [facility]ell 02/02/2015, 3:00 PM

## 2015-02-02 NOTE — Plan of Care (Signed)
Problem: RH PAIN MANAGEMENT Goal: RH STG PAIN MANAGED AT OR BELOW PT'S PAIN GOAL <3 Outcome: Progressing No c/o pain     

## 2015-02-02 NOTE — Evaluation (Signed)
Occupational Therapy Assessment and Plan  Patient Details  Name: Natalie [name] MRN: [id number] Date of Birth: 04-20-1971  OT Diagnosis: hemiplegia affecting dominant side Rehab Potential:   ELOS: 10-12days   Today's Date: 02/02/2015 OT Individual Time: 9179-1505 OT Individual Time Calculation (min): 60 min     Problem List:  Patient Active Problem List   Diagnosis Date Noted  . Weakness   . Life long cognitive dysfunction 01/24/2015  . Hypothyroidism 01/24/2015  . Hyperlipidemia 01/24/2015  . Anemia 01/24/2015  . Hyperkalemia 01/24/2015  . Leukocytosis 01/24/2015  . UTI (urinary tract infection) 01/24/2015  . Brainstem stroke 01/23/2015  . ESRD (end stage renal disease) 01/23/2015  . Diabetes 01/23/2015  . CVA (cerebral infarction) 01/23/2015    Past Medical History:  Past Medical History  Diagnosis Date  . Hypertension   . Diabetes mellitus without complication   . Hyperlipidemia 01/24/2015  . ESRD on hemodialysis started 02/25/2013    [facility] [place] -   . Anemia of chronic disease   . Secondary hyperparathyroidism    Past Surgical History:  Past Surgical History  Procedure Laterality Date  . Insertion of dialysis catheter      Assessment & Plan Clinical Impression: Patient is a 44 y.o. year old female [place] speaking female with history of hypertension,mild MR, ESRD-HD TTS who was admitted on 01/23/2015 with difficulty swallowing with drooling, dysarthria, R-sided weakness with increased difficulty walking. MRI of the brain showed acute brainstem infarct, left paracentral pons lacune with no mass effect as well as chronic lacunar infarcts of the right pons and left basal ganglia corona radiata. Carotid Dopplers without significant ICA stenosis. 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 She was noted to have leucocytosis likely due to UTI and was placed on Cipro for  treatment. TSH elevated and synthroid added for supplement. Swallow evaluation done and patient placed on dysphagia 3 diet with nectar liquids. Cognitive evaluation reveals patient at baseline. Dr. Erlinda Hong recommended ASA for left paracentral stroke due to small vessel disease. Patient has had ongoing issues with abdominal pain and CT abdomen/pelvis 4/1 revealed hepatic steatosis and bilateral renal atrophy--no gross abnormality. Abdominal pain improved past treatment of constipation. Patient has had reactive leucocytosis with cough, fever and elevated procalcitonin due to bronchitis and was started on Levaquin for treatment yesterday. Patient with right sided weakness with foot drop affecting safety with mobility as well as impusivity.  Patient transferred to CIR on 02/01/2015 .    Patient currently requires mod with basic self-care skills and min A for functional mobility secondary to muscle weakness, decreased cardiorespiratoy endurance, impaired timing and sequencing, abnormal tone, unbalanced muscle activation, decreased coordination and decreased motor planning, decreased visual acuity and decreased visual perceptual skills and decreased sitting balance, decreased standing balance, decreased postural control, hemiplegia and decreased balance strategies.  Prior to hospitalization, patient could complete ADL with supervision.  Patient will benefit from skilled intervention to decrease level of assist with basic self-care skills and increase independence with basic self-care skills prior to discharge home with care partner.  Anticipate patient will require 24 hour supervision and follow up outpatient.  OT - End of Session Activity Tolerance: Tolerates 10 - 20 min activity with multiple rests Endurance Deficit: Yes Endurance Deficit Description: Pt given several breaks during session. OT Assessment Barriers to Discharge: Decreased caregiver support OT Patient demonstrates impairments in the following  area(s): Balance;Endurance;Motor;Pain;Perception;Safety;Sensory;Skin Integrity;Vision OT Basic ADL's Functional Problem(s): Eating;Grooming;Bathing;Dressing;Toileting  OT Transfers Functional Problem(s): Toilet;Tub/Shower OT Additional Impairment(s): Fuctional Use of Upper Extremity OT Plan OT Intensity: Minimum of 1-2 x/day, 45 to 90 minutes OT Frequency: 5 out of 7 days OT Duration/Estimated Length of Stay: 10-12days OT Treatment/Interventions: Balance/vestibular training;Community reintegration;Discharge planning;Functional mobility training;DME/adaptive equipment instruction;Disease mangement/prevention;Neuromuscular re-education;Functional electrical stimulation;Patient/family education;Pain management;Skin care/wound managment;Self Care/advanced ADL retraining;Therapeutic Exercise;UE/LE Strength taining/ROM;UE/LE Coordination activities;Splinting/orthotics;Therapeutic Activities;Psychosocial support;Visual/perceptual remediation/compensation OT Self Feeding Anticipated Outcome(s): supervision OT Basic Self-Care Anticipated Outcome(s): supervision OT Toileting Anticipated Outcome(s): supervision OT Bathroom Transfers Anticipated Outcome(s): supervision OT Recommendation Patient destination: Home Follow Up Recommendations: Outpatient OT Equipment Recommended: Tub/shower bench   Skilled Therapeutic Intervention 1:1 OT eval initiated with OT goals, purpose, and role. Self care retraining at shower level with focus on functional ambulation with HHA, sit to stand, standing balance, neuromuscular reeducation/ functional gross use of right UE. Pt with increased fatigue with all activities today and requested to rest in bed at conclusion to session. Pt required min A for right hand use. Left in bed with NT   OT Evaluation Precautions/Restrictions  Precautions Precautions: Fall Precaution Comments: HD  Restrictions Weight Bearing Restrictions: No General Chart Reviewed: Yes Family/Caregiver  Present: No    Pain Pain Assessment Pain Assessment: No/denies pain Home Living/Prior Functioning Home Living Available Help at Discharge: Family Type of Home: Mobile home Home Access: Ramped entrance Entrance Stairs-Rails: Right Home Layout: One level Additional Comments: pt lives with sister who works during the day; niece can also help out during the day  Lives With: Family (sister) Prior Function Level of Independence: Independent with basic ADLs, Independent with homemaking with ambulation, Independent with gait, Independent with transfers  Able to Take Stairs?: Yes Driving: No Vocation: Unemployed Leisure: Hobbies-yes (Comment) Comments: Likes to make tortillias  ADL ADL ADL Comments: see FIM Vision/Perception  Vision- History Baseline Vision/History: No visual deficits Vision- Assessment Vision Assessment?: Yes Eye Alignment: Impaired (comment) Ocular Range of Motion: Restricted looking up Tracking/Visual Pursuits: Decreased smoothness of horizontal tracking;Requires cues, head turns, or add eye shifts to track Saccades: Additional head turns occurred during testing Convergence: Impaired - to be further tested in functional context Additional Comments: functionally vision seems off will continue to assess   Cognition Overall Cognitive Status: History of cognitive impairments - at baseline Arousal/Alertness: Awake/alert Orientation Level: Oriented X4 Attention: Sustained Focused Attention: Appears intact Sustained Attention: Appears intact Memory: Appears intact Awareness: Impaired Awareness Impairment: Emergent impairment Executive Function: Self Monitoring;Sequencing Sequencing: Appears intact Self Monitoring: Impaired Self Monitoring Impairment: Verbal basic Behaviors: Impulsive Safety/Judgment: Impaired Comments: quick release belt  Sensation Sensation Light Touch: Appears Intact Proprioception: Impaired by gross assessment Additional Comments:  Proprioception impaired on R and L LE/ UE Coordination Gross Motor Movements are Fluid and Coordinated: No Fine Motor Movements are Fluid and Coordinated: No Coordination and Movement Description: decr gross and fine motor in right UE Motor  Motor Motor: Hemiplegia;Abnormal postural alignment and control Mobility  Bed Mobility Bed Mobility: Rolling Right;Supine to Sit;Sit to Supine;Scooting to [facility] Rolling Right: 5: Supervision Right Sidelying to Sit: 5: Supervision;HOB flat Transfers Transfers: Sit to Stand;Stand to Sit Sit to Stand: 4: Min assist Stand to Sit: 4: Min assist;4: Min guard  Trunk/Postural Assessment  Cervical Assessment Cervical Assessment: Within Functional Limits Thoracic Assessment Thoracic Assessment: Within Functional Limits Lumbar Assessment Lumbar Assessment: Within Functional Limits Postural Control Postural Control: Deficits on evaluation Postural Limitations: Pt has R UE/LE weakness and requires HHA for balance during ambulation and stairs  Balance Balance Balance Assessed: Yes Dynamic Sitting Balance  Dynamic Sitting - Level of Assistance: 4: Min assist Dynamic Sitting - Balance Activities: Lateral lean/weight shifting;Forward lean/weight shifting Sitting balance - Comments: durning dressing tasks Static Standing Balance Static Standing - Level of Assistance: 4: Min assist Extremity/Trunk Assessment RUE Assessment RUE Assessment:  (3 to 3+/5 prox to distal) RUE AROM (degrees) Overall AROM Right Upper Extremity: Within functional limits for tasks performed RUE Tone RUE Tone: Within Functional Limits LUE Assessment LUE Assessment: Within Functional Limits  FIM:  FIM - Eating Eating Activity: 5: Needs verbal cues/supervision;4: Helper checks for pocketed food;5: Set-up assist for open containers FIM - Grooming Grooming Steps: Wash, rinse, dry face;Wash, rinse, dry hands;Brush, comb hair Grooming: 3: Patient completes 2 of 4 or 3 of 5 steps FIM  - Bathing Bathing Steps Patient Completed: Chest;Right Arm;Abdomen;Front perineal area;Right upper leg;Left upper leg Bathing: 3: Mod-Patient completes 5-7 30f10 parts or 50-74% FIM - Upper Body Dressing/Undressing Upper body dressing/undressing steps patient completed: Thread/unthread left sleeve of pullover shirt/dress Upper body dressing/undressing: 2: Max-Patient completed 25-49% of tasks FIM - Lower Body Dressing/Undressing Lower body dressing/undressing steps patient completed: Thread/unthread left underwear leg;Thread/unthread right underwear leg;Thread/unthread left pants leg Lower body dressing/undressing: 2: Max-Patient completed 25-49% of tasks FIM - Toileting Toileting steps completed by patient: Performs perineal hygiene Toileting: 2: Max-Patient completed 1 of 3 steps FIM - Bed/Chair Transfer Bed/Chair Transfer: 4: Bed > Chair or W/C: Min A (steadying Pt. > 75%);4: Chair or W/C > Bed: Min A (steadying Pt. > 75%);3: Supine > Sit: Mod A (lifting assist/Pt. 50-74%/lift 2 legs;4: Sit > Supine: Min A (steadying pt. > 75%/lift 1 leg) FIM - Toilet Transfers Toilet Transfers: 4-To toilet/BSC: Min A (steadying Pt. > 75%);4-From toilet/BSC: Min A (steadying Pt. > 75%) FIM - TSystems developerDevices: Shower chair;Grab bars Tub/shower Transfers: 4-Into Tub/Shower: Min A (steadying Pt. > 75%/lift 1 leg);4-Out of Tub/Shower: Min A (steadying Pt. > 75%/lift 1 leg)   Refer to Care Plan for Long Term Goals  Recommendations for other services: None  Discharge Criteria: Patient will be discharged from OT if patient refuses treatment 3 consecutive times without medical reason, if treatment goals not met, if there is a change in medical status, if patient makes no progress towards goals or if patient is discharged from hospital.  The above assessment, treatment plan, treatment alternatives and goals were discussed and mutually agreed upon: by patient  SNicoletta Ba4/02/2015, 2:21 PM

## 2015-02-03 ENCOUNTER — Inpatient Hospital Stay (HOSPITAL_COMMUNITY): Payer: MEDICAID | Admitting: Speech Pathology

## 2015-02-03 ENCOUNTER — Inpatient Hospital Stay (HOSPITAL_COMMUNITY): Payer: Self-pay | Admitting: Rehabilitation

## 2015-02-03 ENCOUNTER — Inpatient Hospital Stay (HOSPITAL_COMMUNITY): Payer: Self-pay | Admitting: Occupational Therapy

## 2015-02-03 DIAGNOSIS — D638 Anemia in other chronic diseases classified elsewhere: Secondary | ICD-10-CM

## 2015-02-03 DIAGNOSIS — I6320 Cerebral infarction due to unspecified occlusion or stenosis of unspecified precerebral arteries: Secondary | ICD-10-CM

## 2015-02-03 DIAGNOSIS — N185 Chronic kidney disease, stage 5: Secondary | ICD-10-CM

## 2015-02-03 LAB — GLUCOSE, CAPILLARY
GLUCOSE-CAPILLARY: 112 mg/dL — AB (ref 70–99)
GLUCOSE-CAPILLARY: 82 mg/dL (ref 70–99)
Glucose-Capillary: 116 mg/dL — ABNORMAL HIGH (ref 70–99)
Glucose-Capillary: 102 mg/dL — ABNORMAL HIGH (ref 70–99)

## 2015-02-03 NOTE — Progress Notes (Signed)
Physical Therapy Session Note  Patient Details  Name: Natalie [name] MRN: [id number] Date of Birth: [birth year]  Today's Date: 02/03/2015 PT Individual Time: 1300-1415 PT Individual Time Calculation (min): 75 min   Short Term Goals: Week 1:  PT Short Term Goal 1 (Week 1): Pt will perform supine<>sit mod A with HOB flat, no rails with mod A and 50% cueing. PT Short Term Goal 2 (Week 1): Pt will perform bed <> chair stand pivot transfers with min A and 25% cues PT Short Term Goal 3 (Week 1): Pt will ambulate x50' with LRAD in controlled environment with mod A of single therapist PT Short Term Goal 4 (Week 1): Pt will maintain dynamic standing balance without UE support, with supervision and min cues.   Skilled Therapeutic Interventions/Progress Updates:   Pt received sitting in recliner in room, niece present during session along with interpretor to translate and observe therapy session.  Pt ambulated short distance in room to w/c with R HHA min A level with cues for upright posture.  Assisted to/from therapy gym in w/c at total A level for time management.  Skilled session focused on gait without device to challenge balance, standing NMR for RUE/LE with reaching tasks, toe taps to cones without UE support, standing on airdex pad while reaching with RUE to place clothes pins to the R.  Progressed to tall kneeling and quadruped activities while reaching to the R with RUE for weight bearing through RLE, RUE fine motor coordination and quadruped for BUE/LE WB as well as increased WB through RUE while reaching across body with LUE to place clothes pins.  Pt unable to remain in quadruped more than 2 mins due to increased pain in R shoulder and cramp in abdomen.  Ended session with seated nustep x 6 mins at level 4 resistance with BUEs/LEs for NMR and increased attention to RUE/LE during task.  Pt assisted back to room and left in bed with all needs in reach and bed alarm set.    Therapy  Documentation Precautions:  Precautions Precautions: Fall Precaution Comments: HD  Restrictions Weight Bearing Restrictions: No   Pain: Pain Assessment Pain Assessment: No/denies pain  See FIM for current functional status  Therapy/Group: Individual Therapy  Vista Deckarcell, Keziyah Kneale Ann 02/03/2015, 1:29 PM

## 2015-02-03 NOTE — Progress Notes (Signed)
Social Work Patient ID: Natalie [name], female   DOB: [birth year], 44 y.o.   MRN: [id number] Met with pt and niece who was here to go through PT session, to discuss team conference goals-supervision level goals and discharge 4/14.  She was pleased with how well she is doing here. Discussed services she can receive and niece questioned about eligibility for food stamps, told to apply at [place]. This worker can not get her an appointment for, first come first serve. Her small car concerns her and she would like therapists to do an actual car transfer before pt leaves the hospital.  Will continue to work on discharge plans.

## 2015-02-03 NOTE — Progress Notes (Signed)
Speech Language Pathology Daily Session Note  Patient Details  Name: Natalie [name] MRN: [id number] Date of Birth: 28-Dec-1970  Today's Date: 02/03/2015 SLP Individual Time: 1100-1200 SLP Individual Time Calculation (min): 60 min  Short Term Goals: Week 1: SLP Short Term Goal 1 (Week 1): Pt will use increased vocal intensity and slow rate to achieve intelligibiltiy at the phrase/sentence level with min verbal and visual cues.   SLP Short Term Goal 2 (Week 1): Pt will recall at least 2 dysarthria strategies with min verbal and visual cues over 2 consecutive sessions.  SLP Short Term Goal 3 (Week 1): Pt will return demonstration of diaphragmatic breathing exercises to improve breath support for speech with min instructional cues over 2 consecutive sessions.  SLP Short Term Goal 4 (Week 1): Pt will consume dys 3 textures and thin liquids with supervision cues for use of lingual sweep and liquid wash to clear right sided buccal residue over 3 consecutive sessions prior to diet advancement.      Skilled Therapeutic Interventions: Skilled therapy session focused on speech and dysphagia goals. Upon arrival, patient was awake, sitting up in recliner with interpreter present. Student facilitated session by providing oral-motor exercises targeting labial and lingual strength and ROM. Patient required Max A visual, verbal, and tactile cues for completion of each exercise due to difficulty sequencing movements and cognitive LOF, and required further Mod A redirection to exercises due to patient intermittent laughter while completing exercises, reporting "these make me laugh". Patient educated on importance and purpose of oral-motor exercises; she verbalized understanding but will need reinforcement. Patient observed to consume small sips of thin liquid via straw with wet vocal quality x1 which cleared with cued throat clear; patient demonstrated no overt s/s of aspiration but reporting feeling "food stuck in  throat" while eating. Per interpreter, no instances of coughing or throat clearing during PO intake; question patient's cognitive level impacting accuracy of reporting globus sensation. Patient required encouragement to don dentures in preparation for lunchtime meal and educated on importance of dentures regarding increased mastication efficiency and safety. Patient continues to demonstrate decreased speech intelligibility characterized by imprecise consonant production and decreased vocal intensity, requiring Mod A verbal cues for use of intelligibility strategies. Patient left upright in recliner with quick-release belt in place and interpreter present. Continue with current plan of care.   FIM:  Comprehension Comprehension Mode: Auditory Comprehension: 4-Understands basic 75 - 89% of the time/requires cueing 10 - 24% of the time Expression Expression Mode: Verbal Expression: 3-Expresses basic 50 - 74% of the time/requires cueing 25 - 50% of the time. Needs to repeat parts of sentences. Social Interaction Social Interaction: 4-Interacts appropriately 75 - 89% of the time - Needs redirection for appropriate language or to initiate interaction. Problem Solving Problem Solving: 5-Solves basic 90% of the time/requires cueing < 10% of the time Memory Memory: 4-Recognizes or recalls 75 - 89% of the time/requires cueing 10 - 24% of the time FIM - Eating Eating Activity: 6: Swallowing techniques: self-managed;5: Supervision/cues  Pain Pain Assessment Pain Assessment: No/denies pain  Therapy/Group: Individual Therapy  Tacey RuizKatherine Zamariya Neal 02/03/2015, 12:45 PM

## 2015-02-03 NOTE — Progress Notes (Signed)
Occupational Therapy Session Note  Patient Details  Name: Natalie [name] MRN: [id number] Date of Birth: [birth year]  Today's Date: 02/03/2015 OT Individual Time: 1000-1100 OT Individual Time Calculation (min): 60 min    Short Term Goals: Week 1:  OT Short Term Goal 1 (Week 1): Pt will perform 3/3 toileting tasks with min A OT Short Term Goal 2 (Week 1): Pt will stand to perform 2/2 grooming task with min A  OT Short Term Goal 3 (Week 1): Pt will use right UE in functional task at gross A with max cuing  OT Short Term Goal 4 (Week 1): Pt will tolerate 30 min of activity with 2 rest breaks to increase endurance.   Skilled Therapeutic Interventions/Progress Updates:  Upon entering the room, pt supine in bed with interpreter present during session. Pt with no c/o pain this session. Pt performed supine >sit with Min A for trunk this session to EOB. Pt reporting she would like to shower this morning. Pt ambulating to toilet with hand held assist with successful voiding. Clothing management performed with steady assist. Pt then ambulating hand held to obtain all clothing items needed for self care tasks. Bathing at shower level with Mod A while seated on shower chair. Stand pivot transfer into and out of shower chair with steady assist. Dressing completed seated on EOB with circle sitting to don socks. Pt requiring mod verbal cues to utilize R UE for functional tasks. Pt seated in recliner chair and therapist provided education via interpreter on causes of CVA and answering any questions regarding stay in rehab. SLP entering room as therapist exiting.   Therapy Documentation Precautions:  Precautions Precautions: Fall Precaution Comments: HD  Restrictions Weight Bearing Restrictions: No ADL: ADL ADL Comments: see FIM  See FIM for current functional status  Therapy/Group: Individual Therapy  Lowella Gripittman, Rylyn Ranganathan L 02/03/2015, 12:32 PM

## 2015-02-03 NOTE — Patient Care Conference (Signed)
Inpatient RehabilitationTeam Conference and Plan of Care Update Date: 02/03/2015   Time: 11;40 AM    Patient Name: Natalie [name]      Medical Record Number: [id number]  Date of Birth: [birth year] Sex: Female         Room/Bed: 4M11C/4M11C-01 Payor Info: Payor: MEDICAID POTENTIAL / Plan: MEDICAID POTENTIAL / Product Type: *No Product type* /    Admitting Diagnosis: ESRD  Admit Date/Time:  02/01/2015  4:46 PM Admission Comments: No comment available   Primary Diagnosis:  <principal problem not specified> Principal Problem: <principal problem not specified>  Patient Active Problem List   Diagnosis Date Noted  . Weakness   . Life long cognitive dysfunction 01/24/2015  . Hypothyroidism 01/24/2015  . Hyperlipidemia 01/24/2015  . Anemia 01/24/2015  . Hyperkalemia 01/24/2015  . Leukocytosis 01/24/2015  . UTI (urinary tract infection) 01/24/2015  . Brainstem stroke 01/23/2015  . ESRD (end stage renal disease) 01/23/2015  . Diabetes 01/23/2015  . CVA (cerebral infarction) 01/23/2015    Expected Discharge Date: Expected Discharge Date: 02/11/15  Team Members Present: Physician leading conference: Dr. Claudette Laws Social Worker Present: Dossie Der, LCSW Nurse Present: Carmie End, RN PT Present: Harriet Butte, PT OT Present: Callie Fielding, OT SLP Present: Jackalyn Lombard, SLP PPS Coordinator present : Tora Duck, RN, CRRN     Current Status/Progress Goal Weekly Team Focus  Medical   Difficulty following command , multiple infarcts seen on imaging  Home with family assist  get pt used to using interpreter during PT/OT sessions   Bowel/Bladder   Continent of bowel and bladder. LBM 02/01/15     HD-Tues, Th, Saturday (pt does void)  Pt to remain continent of bowel and bladder.   Monitor   Swallow/Nutrition/ Hydration   dys 3 textures, thin liquids; full supervision for use of swallowing precautions due to cognition   mod I with least restrictive diet   trials of advanced  consistencies   ADL's   mod A overall   supervision overall   dynamic standing balance, endurance, right UE coordination, [facility]   Mobility   Pt currently min A overall  S overall  RUE/LE NMR, coordination, high level balance, gait, stairs, pt/family education   Communication   moderate dysarthria due to right sided weakness   supervision for use of compensatory strategies   education and carryover of strategies in phrases/sentences    Safety/Cognition/ Behavioral Observations  Decreased recall of new information, decreased emergent awareness of deficits; question pt's baseline given reports of learning disability and needing assist prior to admission  supervision   safety awareness, carryover of daily information.    Pain   No c/o pain  <3  Monitor for nonverbalc cues   Skin   CDI  CDI  Encourage turn q 2hrs      *See Care Plan and progress notes for long and short-term goals.  Barriers to Discharge: family also not [place] speaking or have other     Possible Resolutions to Barriers:  family training with interpreter present    Discharge Planning/Teaching Needs:  Home with sister who will provide care but also takes care of her 64 yo daughter with Down;s syndrome. Niece here from [place] to make sure resources are set up and to assist while pt in the hospital      Team Discussion:  Goals-supervision level making good progress in therapies. Back pain better-heating pad works. Ques vision issues with have niece discuss with, may be more comfortable with disclosing. HD-T, TH,  Sat  Revisions to Treatment Plan:  None   Continued Need for Acute Rehabilitation Level of Care: The patient requires daily medical management by a physician with specialized training in physical medicine and rehabilitation for the following conditions: Daily direction of a multidisciplinary physical rehabilitation program to ensure safe treatment while eliciting the highest outcome that is of practical value to the  patient.: Yes Daily medical management of patient stability for increased activity during participation in an intensive rehabilitation regime.: Yes Daily analysis of laboratory values and/or radiology reports with any subsequent need for medication adjustment of medical intervention for : Neurological problems;Other  Lucy ChrisDupree, Ryli Standlee G 02/04/2015, 8:48 AM

## 2015-02-03 NOTE — Progress Notes (Signed)
44 y.o. Natalie [name] speaking female with history of hypertension,mild MR, ESRD-HD TTS who was admitted on 01/23/2015 with difficulty swallowing with drooling, dysarthria, R-sided weakness with increased difficulty walking. MRI of the brain showed acute brainstem infarct, left paracentral pons lacune with no mass effect as well as chronic lacunar infarcts of the right pons and left basal ganglia corona radiata. Carotid Dopplers without significant ICA stenosis. 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   Subjective/Complaints: Eating breakfast, smiling  ROS-limited English  Objective: Vital Signs: Blood pressure 135/74, pulse 93, temperature 98.5 F (36.9 C), temperature source Oral, resp. rate 18, weight 57.6 kg (126 lb 15.8 oz), last menstrual period 12/23/2014, SpO2 98 %. No results found. Results for orders placed or performed during the hospital encounter of 02/01/15 (from the past 72 hour(s))  Glucose, capillary     Status: Abnormal   Collection Time: 02/01/15  4:57 PM  Result Value Ref Range   Glucose-Capillary 153 (H) 70 - 99 mg/dL   Comment 1 Notify RN   CBC     Status: Abnormal   Collection Time: 02/01/15  6:28 PM  Result Value Ref Range   WBC 15.2 (H) 4.0 - 10.5 K/uL   RBC 2.98 (L) 3.87 - 5.11 MIL/uL   Hemoglobin 9.4 (L) 12.0 - 15.0 g/dL   HCT 28.6 (L) 36.0 - 46.0 %   MCV 96.0 78.0 - 100.0 fL   MCH 31.5 26.0 - 34.0 pg   MCHC 32.9 30.0 - 36.0 g/dL   RDW 16.0 (H) 11.5 - 15.5 %   Platelets 562 (H) 150 - 400 K/uL  Glucose, capillary     Status: Abnormal   Collection Time: 02/01/15  9:56 PM  Result Value Ref Range   Glucose-Capillary 231 (H) 70 - 99 mg/dL  Glucose, capillary     Status: Abnormal   Collection Time: 02/02/15  7:06 AM  Result Value Ref Range   Glucose-Capillary 125 (H) 70 - 99 mg/dL  Glucose, capillary     Status: Abnormal   Collection Time: 02/02/15 12:04 PM  Result  Value Ref Range   Glucose-Capillary 178 (H) 70 - 99 mg/dL   Comment 1 Notify RN   Glucose, capillary     Status: Abnormal   Collection Time: 02/02/15  4:52 PM  Result Value Ref Range   Glucose-Capillary 140 (H) 70 - 99 mg/dL   Comment 1 Notify RN   CBC     Status: Abnormal   Collection Time: 02/02/15  8:15 PM  Result Value Ref Range   WBC 15.8 (H) 4.0 - 10.5 K/uL   RBC 2.74 (L) 3.87 - 5.11 MIL/uL   Hemoglobin 8.7 (L) 12.0 - 15.0 g/dL   HCT 26.1 (L) 36.0 - 46.0 %   MCV 95.3 78.0 - 100.0 fL   MCH 31.8 26.0 - 34.0 pg   MCHC 33.3 30.0 - 36.0 g/dL   RDW 16.7 (H) 11.5 - 15.5 %   Platelets 670 (H) 150 - 400 K/uL  Renal function panel     Status: Abnormal   Collection Time: 02/02/15  8:15 PM  Result Value Ref Range   Sodium 131 (L) 135 - 145 mmol/L   Potassium 4.8 3.5 - 5.1 mmol/L   Chloride 92 (L) 96 - 112 mmol/L   CO2 21 19 - 32 mmol/L   Glucose, Bld 155 (H) 70 - 99 mg/dL   BUN 69 (H) 6 - 23 mg/dL  Creatinine, Ser 10.83 (H) 0.50 - 1.10 mg/dL   Calcium 9.0 8.4 - 10.5 mg/dL   Phosphorus 3.8 2.3 - 4.6 mg/dL   Albumin 3.5 3.5 - 5.2 g/dL   GFR calc non Af Amer 4 (L) >90 mL/min   GFR calc Af Amer 4 (L) >90 mL/min    Comment: (NOTE) The eGFR has been calculated using the CKD EPI equation. This calculation has not been validated in all clinical situations. eGFR's persistently <90 mL/min signify possible Chronic Kidney Disease.    Anion gap 18 (H) 5 - 15  Glucose, capillary     Status: Abnormal   Collection Time: 02/02/15 11:26 PM  Result Value Ref Range   Glucose-Capillary 133 (H) 70 - 99 mg/dL  Glucose, capillary     Status: Abnormal   Collection Time: 02/03/15  6:49 AM  Result Value Ref Range   Glucose-Capillary 116 (H) 70 - 99 mg/dL     HEENT: normal Cardio: RRR Resp: CTA B/L and unlabored GI: BS positive, Tender and NT,ND Extremity:  Pulses positive and No Edema Skin:   Intact Neuro: Alert, follows commands with gestural cues Normal Motor and Abnormal Motor 3- R delt  bi tri, 3- grip, 3- HF, KE 2- ankle Musc/Skel:  Other no pain with UE and LE ROM, No joint swelling Gen NAD   Assessment/Plan: 1. 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 Physiatrist is providing close team supervision and 24 hour management of active medical problems listed below. Physiatrist and rehab team continue to assess barriers to discharge/monitor patient progress toward functional and medical goals. Team conference today please see physician documentation under team conference tab, met with team face-to-face to discuss problems,progress, and goals. Formulized individual treatment plan based on medical history, underlying problem and comorbidities. FIM: FIM - Bathing Bathing Steps Patient Completed: Chest, Right Arm, Abdomen, Front perineal area, Right upper leg, Left upper leg Bathing: 3: Mod-Patient completes 5-7 44f10 parts or 50-74%  FIM - Upper Body Dressing/Undressing Upper body dressing/undressing steps patient completed: Thread/unthread left sleeve of pullover shirt/dress Upper body dressing/undressing: 2: Max-Patient completed 25-49% of tasks FIM - Lower Body Dressing/Undressing Lower body dressing/undressing steps patient completed: Thread/unthread left underwear leg, Thread/unthread right underwear leg, Thread/unthread left pants leg Lower body dressing/undressing: 2: Max-Patient completed 25-49% of tasks  FIM - Toileting Toileting steps completed by patient: Adjust clothing prior to toileting, Performs perineal hygiene, Adjust clothing after toileting Toileting Assistive Devices: Grab bar or rail for support Toileting: 4: Steadying assist  FIM - TRadio producerDevices: Grab bars Toilet Transfers: 4-To toilet/BSC: Min A (steadying Pt. > 75%), 4-From toilet/BSC: Min A (steadying Pt. >  75%)  FIM - Bed/Chair Transfer Bed/Chair Transfer Assistive Devices: Bed rails Bed/Chair Transfer: 4: Supine > Sit: Min A (steadying Pt. > 75%/lift 1 leg), 4: Sit > Supine: Min A (steadying pt. > 75%/lift 1 leg)  FIM - Locomotion: Wheelchair Locomotion: Wheelchair: 0: Activity did not occur FIM - Locomotion: Ambulation Locomotion: Ambulation Assistive Devices: Other (comment) (R HHA, L hallway railing) Ambulation/Gait Assistance: 4: Min assist Locomotion: Ambulation: 1: Travels less than 50 ft with minimal assistance (Pt.>75%)  Comprehension Comprehension Mode: Auditory Comprehension: 4-Understands basic 75 - 89% of the time/requires cueing 10 - 24% of the time  Expression Expression Mode: Verbal Expression: 4-Expresses basic 75 - 89% of the time/requires cueing 10 - 24% of the time.  Needs helper to occlude trach/needs to repeat words.  Social Interaction Social Interaction: 4-Interacts appropriately 75 - 89% of the time - Needs redirection for appropriate language or to initiate interaction.  Problem Solving Problem Solving: 5-Solves basic 90% of the time/requires cueing < 10% of the time  Memory Memory: 4-Recognizes or recalls 75 - 89% of the time/requires cueing 10 - 24% of the time  Medical Problem List and Plan: 1. 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 DVT Prophylaxis/Anticoagulation: Pharmaceutical: Lovenox 3. Pain Management: tylenol 4. Mood: team to provide egosupport as possible 5. Neuropsych: This patient is not capable of making decisions on her own behalf. 6. Skin/Wound Care: encourage adequate nutrition, pressure relief,  7. Fluids/Electrolytes/Nutrition: renal diet 1200cc FR 8. DM type 2: lantus insulin with SSI 9. ESRD: HD per renal service, dialysis to occur after therapy completed, TTS schedule 10. Dysphagia: tolerating regular consistency diet at present 11. HTN: bp controlled at present,  volume mgt with HD 12. Anemia of chronic disease: aranesp  LOS (Days) 2 A FACE TO FACE EVALUATION WAS PERFORMED  Shadawn Hanaway E 02/03/2015, 8:13 AM

## 2015-02-04 ENCOUNTER — Ambulatory Visit (HOSPITAL_COMMUNITY): Payer: Self-pay | Admitting: Occupational Therapy

## 2015-02-04 ENCOUNTER — Inpatient Hospital Stay (HOSPITAL_COMMUNITY): Payer: Self-pay | Admitting: Speech Pathology

## 2015-02-04 ENCOUNTER — Inpatient Hospital Stay (HOSPITAL_COMMUNITY): Payer: Self-pay | Admitting: Rehabilitation

## 2015-02-04 LAB — GLUCOSE, CAPILLARY
GLUCOSE-CAPILLARY: 66 mg/dL — AB (ref 70–99)
GLUCOSE-CAPILLARY: 106 mg/dL — AB (ref 70–99)
Glucose-Capillary: 45 mg/dL — ABNORMAL LOW (ref 70–99)
Glucose-Capillary: 159 mg/dL — ABNORMAL HIGH (ref 70–99)
Glucose-Capillary: 49 mg/dL — ABNORMAL LOW (ref 70–99)
Glucose-Capillary: 73 mg/dL (ref 70–99)
Glucose-Capillary: 126 mg/dL — ABNORMAL HIGH (ref 70–99)

## 2015-02-04 MED ORDER — HEPARIN SODIUM (PORCINE) 1000 UNIT/ML DIALYSIS
1000.0000 [IU] | INTRAMUSCULAR | Status: DC | PRN
  Filled 2015-02-04: qty 1

## 2015-02-04 MED ORDER — PENTAFLUOROPROP-TETRAFLUOROETH EX AERO: 1.0000 "application " | INHALATION_SPRAY | CUTANEOUS | Status: DC | PRN

## 2015-02-04 MED ORDER — SODIUM CHLORIDE 0.9 % IV SOLN: 100.0000 mL | INTRAVENOUS | Status: DC | PRN

## 2015-02-04 MED ORDER — LIDOCAINE-PRILOCAINE 2.5-2.5 % EX CREA: 1.0000 "application " | TOPICAL_CREAM | CUTANEOUS | Status: DC | PRN

## 2015-02-04 MED ORDER — ALTEPLASE 2 MG IJ SOLR
2.0000 mg | Freq: Once | INTRAMUSCULAR | Status: AC | PRN
  Filled 2015-02-04: qty 2

## 2015-02-04 MED ORDER — HEPARIN SODIUM (PORCINE) 1000 UNIT/ML DIALYSIS
20.0000 [IU]/kg | INTRAMUSCULAR | Status: DC | PRN
  Filled 2015-02-04: qty 2

## 2015-02-04 MED ORDER — NEPRO/CARBSTEADY PO LIQD: 237.0000 mL | ORAL | Status: DC | PRN

## 2015-02-04 MED ORDER — GLUCOSE 40 % PO GEL
ORAL | Status: AC
  Administered 2015-02-04: 37.5 g
  Filled 2015-02-04: qty 1

## 2015-02-04 MED ORDER — LIDOCAINE HCL (PF) 1 % IJ SOLN: 5.0000 mL | INTRAMUSCULAR | Status: DC | PRN

## 2015-02-04 NOTE — IPOC Note (Signed)
Overall Plan of Care [facility](IPOC) Patient Details Name: Natalie [name] MRN: [id number] DOB: 11/30/1970  Admitting Diagnosis: ESRD  Hospital Problems: Active Problems:   CVA (cerebral infarction)     Functional Problem List: Nursing Nutrition, Safety  PT Balance, Endurance, Motor, Safety  OT Balance, Endurance, Motor, Pain, Perception, Safety, Sensory, Skin Integrity, Vision  SLP Cognition, Linguistic, Nutrition  TR         Basic ADL's: OT Eating, Grooming, Bathing, Dressing, Toileting     Advanced  ADL's: OT       Transfers: PT Bed Mobility, Bed to Chair, Car, Furniture, Civil Service fast [place]  OT Toilet, Research scientist (life sciences)Tub/Shower     Locomotion: PT Ambulation, Stairs     Additional Impairments: OT Fuctional Use of Upper Extremity  SLP Swallowing, Communication, Social Cognition expression Memory, Awareness, Problem Solving  TR      Anticipated Outcomes Item Anticipated Outcome  Self Feeding supervision  Swallowing  mod I with least restrictive diet    Basic self-care  supervision  Toileting  supervision   Bathroom Transfers supervision  Bowel/Bladder  Mod I  Transfers  Supervision  Locomotion  Supervision  Communication  Supervision for use of dysarthria strategies   Cognition     Pain  <3  Safety/Judgment  Supervision   Therapy Plan: PT Intensity: Minimum of 1-2 x/day ,45 to 90 minutes PT Frequency: 5 out of 7 days PT Duration Estimated Length of Stay: 10-12 OT Intensity: Minimum of 1-2 x/day, 45 to 90 minutes OT Frequency: 5 out of 7 days OT Duration/Estimated Length of Stay: 10-12days SLP Intensity: Minumum of 1-2 x/day, 30 to 90 minutes SLP Frequency: 3 to 5 out of 7 days SLP Duration/Estimated Length of Stay: 10-12 days        Team Interventions: Nursing Interventions Patient/Family Education, Disease Management/Prevention, Medication Management, Psychosocial Support  PT interventions Ambulation/gait training, Warden/rangerBalance/vestibular training, Discharge planning,  DME/adaptive equipment instruction, Functional mobility training, Neuromuscular re-education, Patient/family education, Therapeutic Exercise, Therapeutic Activities, UE/LE Strength taining/ROM, Stair training, UE/LE Coordination activities  OT Interventions Balance/vestibular training, Community reintegration, Discharge planning, Functional mobility training, DME/adaptive equipment instruction, Disease mangement/prevention, Neuromuscular re-education, Functional electrical stimulation, Patient/family education, Pain management, Skin care/wound managment, Self Care/advanced ADL retraining, Therapeutic Exercise, UE/LE Strength taining/ROM, UE/LE Coordination activities, Splinting/orthotics, Therapeutic Activities, Psychosocial support, Visual/perceptual remediation/compensation  SLP Interventions Cueing hierarchy, Cognitive remediation/compensation, Dysphagia/aspiration precaution training, Patient/family education, Oral motor exercises, Internal/external aids, Environmental controls  TR Interventions    SW/CM Interventions Discharge Planning, Psychosocial Support, Patient/Family Education    Team Discharge Planning: Destination: PT-Home ,OT- Home , SLP-Home Projected Follow-up: PT-Home health PT, 24 hour supervision/assistance, OT-  Outpatient OT, SLP-Home Health SLP, 24 hour supervision/assistance, Outpatient SLP Projected Equipment Needs: PT-Rolling walker with 5" wheels, To be determined, OT- Tub/shower bench, SLP-None recommended by SLP Equipment Details: PT- , OT-  Patient/family involved in discharge planning: PT- Patient,  OT-Patient, SLP-Patient  MD ELOS: 10-12d Medical Rehab Prognosis:  Good Assessment: 44 yo female with ESRD and DM now with R HP related to Left CVA.  Now requiring 24/7 Rehab RN,MD, as well as CIR level PT, OT and SLP.  Treatment team will focus on ADLs and mobility with goals set at Supervision   See Team Conference Notes for weekly updates to the plan of care

## 2015-02-04 NOTE — Progress Notes (Signed)
Inpatient Diabetes Program Recommendations  AACE/ADA: New Consensus Statement on Inpatient Glycemic Control (2013)  Target Ranges:  Prepandial:   less than 140 mg/dL      Peak postprandial:   less than 180 mg/dL (1-2 hours)      Critically ill patients:  140 - 180 mg/dL   Results for Natalie [name], [place] (MRN [id number]) as of 02/04/2015 10:58  Ref. Range 02/03/2015 16:49 02/03/2015 20:58 02/04/2015 06:49 02/04/2015 07:24 02/04/2015 08:55  Glucose-Capillary Latest Range: 70-99 mg/dL 782102 (H) 82 45 (L) 66 (L) 106 (H)    Reason for consult: low blood sugar  Diabetes history: Type 2 Outpatient Diabetes medications: Lantus 5 units q hs, Glipizide 5mg /dl, Novolog 0-9 units tid Current orders for Inpatient glycemic control:  Glipizide 5mg /dl, Novolog 0-9 units tid, Novolog 0-5 units qhs  Please consider restarting Lantus 5 units per day and d/c Glipizide.  She has been eating poorly/inconsistently and has documented ESRD- she has a high chance of have a repeat hypoglycemic event.     Susette RacerJulie Dessirae Scarola, RN, BA, MHA, CDE Diabetes Coordinator Inpatient Diabetes Program  [phone] (Team Pager) [phone] Patrcia Dolly([place] Office) 02/04/2015 11:03 AM

## 2015-02-04 NOTE — Progress Notes (Signed)
Speech Language Pathology Daily Session Note  Patient Details  Name: Natalie [name] MRN: [id number] Date of Birth: 05/12/1971  Today's Date: 02/04/2015 SLP Individual Time: 1100-1200 SLP Individual Time Calculation (min): 60 min  Short Term Goals: Week 1: SLP Short Term Goal 1 (Week 1): Pt will use increased vocal intensity and slow rate to achieve intelligibiltiy at the phrase/sentence level with min verbal and visual cues.   SLP Short Term Goal 2 (Week 1): Pt will recall at least 2 dysarthria strategies with min verbal and visual cues over 2 consecutive sessions.  SLP Short Term Goal 3 (Week 1): Pt will return demonstration of diaphragmatic breathing exercises to improve breath support for speech with min instructional cues over 2 consecutive sessions.  SLP Short Term Goal 4 (Week 1): Pt will consume dys 3 textures and thin liquids with supervision cues for use of lingual sweep and liquid wash to clear right sided buccal residue over 3 consecutive sessions prior to diet advancement.      Skilled Therapeutic Interventions:  Pt was seen for skilled ST targeting goals for dysphagia and dysarthria.  Upon arrival, pt was seated upright in recliner, awake, alert, and agreeable to participate in ST.  SLP facilitated the session with trials of regular consistencies to continue working towards diet progression.  Pt demonstrated efficient mastication and complete oral clearance of residual solids from the oral cavity post swallow with supervision.  Pt would be appropriate for diet advancement to regular textures; however, given that pt's dentures do not fit at this point and pt's gums become sensitive when attempting to chew without denture plate in (pt also does not like the taste of provided denture adhesive), SLP will defer diet advancement until pt is better able to tolerate wearing dentures (likely with an alternative adhesive).  Pt returned demonstration of oral motor exercises for right sided  labial, lingual, and buccal weakness with mod assist demonstration cues.  SLP also provided min question cues for recall of recommended dysarthria strategies.  Pt then utilized strategies during a structured singing task with min verbal cues to increase vocal intensity.  Continue per current plan of care.    FIM:  Comprehension Comprehension Mode: Auditory Comprehension: 4-Understands basic 75 - 89% of the time/requires cueing 10 - 24% of the time Expression Expression Mode: Verbal Expression: 3-Expresses basic 50 - 74% of the time/requires cueing 25 - 50% of the time. Needs to repeat parts of sentences. Social Interaction Social Interaction: 4-Interacts appropriately 75 - 89% of the time - Needs redirection for appropriate language or to initiate interaction. Problem Solving Problem Solving: 5-Solves basic 90% of the time/requires cueing < 10% of the time Memory Memory: 4-Recognizes or recalls 75 - 89% of the time/requires cueing 10 - 24% of the time FIM - Eating Eating Activity: 5: Supervision/cues  Pain Pain Assessment Pain Assessment: No/denies pain  Therapy/Group: Individual Therapy  Zoanne Newill, Melanee SpryNicole L 02/04/2015, 12:49 PM

## 2015-02-04 NOTE — Progress Notes (Signed)
Hypoglycemic Event  CBG: 49  Treatment: 1 tube instant glucose  Symptoms: Sweaty  Follow-up CBG: Time:1820 CBG Result:73  Possible Reasons for Event: Unknown  Comments/MD notified:Kirsteins    Staceyann Knouff, Ashley MurrainLuz S  Remember to initiate Hypoglycemia Order Set & complete

## 2015-02-04 NOTE — Progress Notes (Signed)
Hypoglycemic Event  CBG: 45  Treatment: 15 GM carbohydrate snack  Symptoms: Sweaty,  Follow-up CBG: Time:0720 CBG Result:66  Possible Reasons for Event: Inadequate meal intake and Medication regimen: poor po intake   Comments/MD notified:Dr Kirsteins notified, on unit. Reviewing orders. Reported off to oncoming shift   Dorna LeitzWorthington, Sharmel Ballantine  Remember to initiate Hypoglycemia Order Set & complete

## 2015-02-04 NOTE — Progress Notes (Signed)
Physical Therapy Session Note  Patient Details  Name: Natalie [name] MRN: [id number] Date of Birth: August 12, 1971  Today's Date: 02/04/2015 PT Individual Time: 1300-1400 PT Individual Time Calculation (min): 60 min   Short Term Goals: Week 1:  PT Short Term Goal 1 (Week 1): Pt will perform supine<>sit mod A with HOB flat, no rails with mod A and 50% cueing. PT Short Term Goal 2 (Week 1): Pt will perform bed <> chair stand pivot transfers with min A and 25% cues PT Short Term Goal 3 (Week 1): Pt will ambulate x50' with LRAD in controlled environment with mod A of single therapist PT Short Term Goal 4 (Week 1): Pt will maintain dynamic standing balance without UE support, with supervision and min cues.   Skilled Therapeutic Interventions/Progress Updates:   Pt received sitting in recliner in room, agreeable to therapy session.  Interpretor present to assist with translation during session.  Pt ambulated to/from therapy gym at min A level (L HHA) with cues for upright posture, wider step on RLE and increased stride length.  Continue to note narrow BOS during gait, causing unstable balance.  Once in therapy gym, skilled session focused on high level balance and R NMR activities with standing pushing BOSU ball side to side with RLE only to increase semi SLS and also work on hip abd.  Requires min A throughout task with intermittent crossing of LEs with LOB.  Progressed to working on gait while kicking yoga block x 100' x 2 reps with min to mod A.  Note that pt continues to have intermittent LOB with scissored gait pattern.  Cues for pt to slow down and take her time during task to maximize amount of time on each LE.  Then worked on cone taps with BUE unsupported tapping to targets in standing for coordination as well as in sitting with RLE only to encourage slow controlled movement with increased visual attention to RUE.  Also worked on Editor, commissioningdynamic balance on airdex foam while performing bean bag toss with  RUE as well as retrieval of bags from floor with RUE for fine motor, coordination and increased activation of B glutes/quads.  Ended session with seated nustep for NMR through RLE with resistance at level 3 with BLEs only.  Ambulated back to room and left in recliner with quick release belt donned and all needs in reach.   Therapy Documentation Precautions:  Precautions Precautions: Fall Precaution Comments: HD  Restrictions Weight Bearing Restrictions: No   Pain: Pain Assessment Pain Assessment: No/denies pain  See FIM for current functional status  Therapy/Group: Individual Therapy  Vista Deckarcell, Shekela Goodridge Ann 02/04/2015, 1:14 PM

## 2015-02-04 NOTE — Progress Notes (Signed)
Occupational Therapy Session Note  Patient Details  Name: Natalie [name] MRN: [id number] Date of Birth: 1971/04/15  Today's Date: 02/04/2015 OT Individual Time: 1000-1100 OT Individual Time Calculation (min): 60 min    Short Term Goals: Week 1:  OT Short Term Goal 1 (Week 1): Pt will perform 3/3 toileting tasks with min A OT Short Term Goal 2 (Week 1): Pt will stand to perform 2/2 grooming task with min A  OT Short Term Goal 3 (Week 1): Pt will use right UE in functional task at gross A with max cuing  OT Short Term Goal 4 (Week 1): Pt will tolerate 30 min of activity with 2 rest breaks to increase endurance.   Skilled Therapeutic Interventions/Progress Updates:  Upon entering the room, pt reclined in recliner chair with interpreter present in the room. Pt sleeping but easily awoken and stating she felt very weak and was cold. BP taken on L UE and was  132/62 with 86 BPM.  Pt required coaxing to participate in OT session as she was feeling unwell this morning. Pt requesting to wash at shower level. While seated in recliner chair, pt obtained all clothing items needed from dresser. Pt ambulating with min hand held assist into bathroom and onto shower chair with steady assist and use of grab bar. Bathing at shower level with assistance to wash B feet and steady assist when standing to wash buttocks. OT continues to provide max verbal cues to use R UE for self care task. Pt dressing while seated on shower seat with Mod A over all for UB and LB. OT educated pt on clothing that would be easier for pt to don as she is currently wearing spandex leggings and shirts. Pt engaged in circle sitting to don B socks this session. Pt ambulated ~ 100 feet to RW station holding onto R rail and min A for balance with increased scissoring noted in gate with fatigue. Pt seated rest break and then returning to room in similar fashion. Pt seated in recliner chair with call bell within reach and QRB donned upon exiting  the room.   Therapy Documentation Precautions:  Precautions Precautions: Fall Precaution Comments: HD  Restrictions Weight Bearing Restrictions: No ADL: ADL ADL Comments: see FIM  See FIM for current functional status  Therapy/Group: Individual Therapy  Lowella Gripittman, Kalden Wanke L 02/04/2015, 12:44 PM

## 2015-02-04 NOTE — Progress Notes (Signed)
Physical Therapy Session Note  Patient Details  Name: Natalie [name] MRN: [id number] Date of Birth: 1971-02-27  Today's Date: 02/04/2015 PT Individual Time: 1530-1600 PT Individual Time Calculation (min): 30 min   Short Term Goals: Week 1:  PT Short Term Goal 1 (Week 1): Pt will perform supine<>sit mod A with HOB flat, no rails with mod A and 50% cueing. PT Short Term Goal 2 (Week 1): Pt will perform bed <> chair stand pivot transfers with min A and 25% cues PT Short Term Goal 3 (Week 1): Pt will ambulate x50' with LRAD in controlled environment with mod A of single therapist PT Short Term Goal 4 (Week 1): Pt will maintain dynamic standing balance without UE support, with supervision and min cues.   Skilled Therapeutic Interventions/Progress Updates:   Pt received lying in bed, agreeable to therapy session.  Skilled session focused on gait to/from therapy gym without AD in order to continue to challenge balance, weight shifting to the R, and upright posture.  Performed gait x 150' x 2 reps at min A level.  Continued cues for larger R step width and length as well as upright posture and relaxed upper extremities.  Stopped in ADL apt for seated rest break before continuing to therapy gym.  Had pt stand with LLE on 10" step to increase all WB through RLE for glute and hip abd activation while folding pillow cases for bimanual task.  Progressed to side stepping for continued use of hip abductors as well as ending session with standing kinetron x 2 mins with BUE support.  Pt with increased difficulty with pushing RLE due to fatigue.  Ambulated back to room and back to bed.  Left with all needs in reach.    Therapy Documentation Precautions:  Precautions Precautions: Fall Precaution Comments: HD  Restrictions Weight Bearing Restrictions: No   Vital Signs: Therapy Vitals Temp: 98.7 F (37.1 C) Temp Source: Oral Pulse Rate: 90 Resp: 18 BP: (!) 153/76 mmHg Patient Position (if  appropriate): Sitting Oxygen Therapy SpO2: 99 % O2 Device: Not Delivered Pain: Pain Assessment Pain Assessment: No/denies pain  See FIM for current functional status  Therapy/Group: Individual Therapy  Vista Deckarcell, Natalie [name] 02/04/2015, 4:22 PM

## 2015-02-04 NOTE — Progress Notes (Signed)
44 y.o. [place] speaking female with history of hypertension,mild MR, ESRD-HD TTS who was admitted on 01/23/2015 with difficulty swallowing with drooling, dysarthria, R-sided weakness with increased difficulty walking. MRI of the brain showed acute brainstem infarct, left paracentral pons lacune with no mass effect as well as chronic lacunar infarcts of the right pons and left basal ganglia corona radiata. Carotid Dopplers without significant ICA stenosis. 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   Subjective/Complaints: Per RN, low CBG this am, received 5 U lantus last  noc  ROS-limited English  Objective: Vital Signs: Blood pressure 113/62, pulse 73, temperature 98.6 F (37 C), temperature source Oral, resp. rate 18, weight 55.4 kg (122 lb 2.2 oz), last menstrual period 12/23/2014, SpO2 98 %. No results found. Results for orders placed or performed during the hospital encounter of 02/01/15 (from the past 72 hour(s))  Glucose, capillary     Status: Abnormal   Collection Time: 02/01/15  4:57 PM  Result Value Ref Range   Glucose-Capillary 153 (H) 70 - 99 mg/dL   Comment 1 Notify RN   CBC     Status: Abnormal   Collection Time: 02/01/15  6:28 PM  Result Value Ref Range   WBC 15.2 (H) 4.0 - 10.5 K/uL   RBC 2.98 (L) 3.87 - 5.11 MIL/uL   Hemoglobin 9.4 (L) 12.0 - 15.0 g/dL   HCT 28.6 (L) 36.0 - 46.0 %   MCV 96.0 78.0 - 100.0 fL   MCH 31.5 26.0 - 34.0 pg   MCHC 32.9 30.0 - 36.0 g/dL   RDW 16.0 (H) 11.5 - 15.5 %   Platelets 562 (H) 150 - 400 K/uL  Glucose, capillary     Status: Abnormal   Collection Time: 02/01/15  9:56 PM  Result Value Ref Range   Glucose-Capillary 231 (H) 70 - 99 mg/dL  Glucose, capillary     Status: Abnormal   Collection Time: 02/02/15  7:06 AM  Result Value Ref Range   Glucose-Capillary 125 (H) 70 - 99 mg/dL  Glucose, capillary     Status: Abnormal   Collection Time:  02/02/15 12:04 PM  Result Value Ref Range   Glucose-Capillary 178 (H) 70 - 99 mg/dL   Comment 1 Notify RN   Glucose, capillary     Status: Abnormal   Collection Time: 02/02/15  4:52 PM  Result Value Ref Range   Glucose-Capillary 140 (H) 70 - 99 mg/dL   Comment 1 Notify RN   CBC     Status: Abnormal   Collection Time: 02/02/15  8:15 PM  Result Value Ref Range   WBC 15.8 (H) 4.0 - 10.5 K/uL   RBC 2.74 (L) 3.87 - 5.11 MIL/uL   Hemoglobin 8.7 (L) 12.0 - 15.0 g/dL   HCT 26.1 (L) 36.0 - 46.0 %   MCV 95.3 78.0 - 100.0 fL   MCH 31.8 26.0 - 34.0 pg   MCHC 33.3 30.0 - 36.0 g/dL   RDW 16.7 (H) 11.5 - 15.5 %   Platelets 670 (H) 150 - 400 K/uL  Renal function panel     Status: Abnormal   Collection Time: 02/02/15  8:15 PM  Result Value Ref Range   Sodium 131 (L) 135 - 145 mmol/L   Potassium 4.8 3.5 - 5.1 mmol/L   Chloride 92 (L) 96 - 112 mmol/L   CO2 21 19 - 32 mmol/L   Glucose, Bld 155 (H) 70 - 99  mg/dL   BUN 69 (H) 6 - 23 mg/dL   Creatinine, Ser 10.83 (H) 0.50 - 1.10 mg/dL   Calcium 9.0 8.4 - 10.5 mg/dL   Phosphorus 3.8 2.3 - 4.6 mg/dL   Albumin 3.5 3.5 - 5.2 g/dL   GFR calc non Af Amer 4 (L) >90 mL/min   GFR calc Af Amer 4 (L) >90 mL/min    Comment: (NOTE) The eGFR has been calculated using the CKD EPI equation. This calculation has not been validated in all clinical situations. eGFR's persistently <90 mL/min signify possible Chronic Kidney Disease.    Anion gap 18 (H) 5 - 15  Glucose, capillary     Status: Abnormal   Collection Time: 02/02/15 11:26 PM  Result Value Ref Range   Glucose-Capillary 133 (H) 70 - 99 mg/dL  Glucose, capillary     Status: Abnormal   Collection Time: 02/03/15  6:49 AM  Result Value Ref Range   Glucose-Capillary 116 (H) 70 - 99 mg/dL  Glucose, capillary     Status: Abnormal   Collection Time: 02/03/15 11:36 AM  Result Value Ref Range   Glucose-Capillary 112 (H) 70 - 99 mg/dL  Glucose, capillary     Status: Abnormal   Collection Time: 02/03/15   4:49 PM  Result Value Ref Range   Glucose-Capillary 102 (H) 70 - 99 mg/dL  Glucose, capillary     Status: None   Collection Time: 02/03/15  8:58 PM  Result Value Ref Range   Glucose-Capillary 82 70 - 99 mg/dL  Glucose, capillary     Status: Abnormal   Collection Time: 02/04/15  6:49 AM  Result Value Ref Range   Glucose-Capillary 45 (L) 70 - 99 mg/dL  Glucose, capillary     Status: Abnormal   Collection Time: 02/04/15  7:24 AM  Result Value Ref Range   Glucose-Capillary 66 (L) 70 - 99 mg/dL     HEENT: normal Cardio: RRR Resp: CTA B/L and unlabored GI: BS positive, Tender and NT,ND Extremity:  Pulses positive and No Edema Skin:   Intact Neuro: Alert, follows commands with gestural cues Normal Motor and Abnormal Motor 3- R delt bi tri, 3- grip, 3- HF, KE 2- ankle Musc/Skel:  Other no pain with UE and LE ROM, No joint swelling Gen NAD   Assessment/Plan: 1. 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 Physiatrist is providing close team supervision and 24 hour management of active medical problems listed below. Physiatrist and rehab team continue to assess barriers to discharge/monitor patient progress toward functional and medical goals.  FIM: FIM - Bathing Bathing Steps Patient Completed: Chest, Right Arm, Abdomen, Front perineal area, Right upper leg, Left upper leg Bathing: 3: Mod-Patient completes 5-7 [id number] parts or 50-74%  FIM - Upper Body Dressing/Undressing Upper body dressing/undressing steps patient completed: Thread/unthread left sleeve of pullover shirt/dress, Pull shirt over trunk, Put head through opening of pull over shirt/dress Upper body dressing/undressing: 4: Min-Patient completed 75 plus % of tasks FIM - Lower Body Dressing/Undressing Lower body dressing/undressing steps patient completed: Thread/unthread left  underwear leg, Thread/unthread right underwear leg, Thread/unthread left pants leg, Thread/unthread right pants leg, Pull pants up/down, Don/Doff right sock, Don/Doff left sock Lower body dressing/undressing: 3: Mod-Patient completed 50-74% of tasks  FIM - Toileting Toileting steps completed by patient: Adjust clothing prior to toileting, Performs perineal hygiene, Adjust clothing after toileting Toileting Assistive Devices: Grab  bar or rail for support Toileting: 4: Steadying assist  FIM - Radio producer Devices: Grab bars Toilet Transfers: 4-To toilet/BSC: Min A (steadying Pt. > 75%), 4-From toilet/BSC: Min A (steadying Pt. > 75%)  FIM - Bed/Chair Transfer Bed/Chair Transfer Assistive Devices: Bed rails Bed/Chair Transfer: 4: Supine > Sit: Min A (steadying Pt. > 75%/lift 1 leg), 4: Sit > Supine: Min A (steadying pt. > 75%/lift 1 leg)  FIM - Locomotion: Wheelchair Distance: 20 Locomotion: Wheelchair: 0: Activity did not occur FIM - Locomotion: Ambulation Locomotion: Ambulation Assistive Devices: Other (comment) (R HHA) Ambulation/Gait Assistance: 4: Min assist Locomotion: Ambulation: 4: Travels 150 ft or more with minimal assistance (Pt.>75%)  Comprehension Comprehension Mode: Auditory Comprehension: 4-Understands basic 75 - 89% of the time/requires cueing 10 - 24% of the time  Expression Expression Mode: Verbal Expression: 3-Expresses basic 50 - 74% of the time/requires cueing 25 - 50% of the time. Needs to repeat parts of sentences.  Social Interaction Social Interaction: 4-Interacts appropriately 75 - 89% of the time - Needs redirection for appropriate language or to initiate interaction.  Problem Solving Problem Solving: 5-Solves basic 90% of the time/requires cueing < 10% of the time  Memory Memory: 4-Recognizes or recalls 75 - 89% of the time/requires cueing 10 - 24% of the time  Medical Problem List and Plan: 1. Functional deficits  secondary to left paramedian pontine infarct -stroke prophylaxis with plavix 2. DVT Prophylaxis/Anticoagulation: Pharmaceutical: Lovenox 3. Pain Management: tylenol 4. Mood: team to provide egosupport as possible 5. Neuropsych: This patient is not capable of making decisions on her own behalf. 6. Skin/Wound Care: encourage adequate nutrition, pressure relief,  7. Fluids/Electrolytes/Nutrition: renal diet 1200cc FR 8. DM type 2:uncontrolled, d/c hs lantus insulin , cont  SSI, started glucotrol yesterday 9. ESRD: HD per renal service, dialysis to occur after therapy completed, TTS schedule 10. Dysphagia: tolerating regular consistency diet at present 11. HTN: bp controlled at present, volume mgt with HD 12. Anemia of chronic disease: aranesp  LOS (Days) 3 A FACE TO FACE EVALUATION WAS PERFORMED  KIRSTEINS,ANDREW E 02/04/2015, 7:41 AM

## 2015-02-05 ENCOUNTER — Inpatient Hospital Stay (HOSPITAL_COMMUNITY): Payer: Self-pay | Admitting: Rehabilitation

## 2015-02-05 ENCOUNTER — Inpatient Hospital Stay (HOSPITAL_COMMUNITY): Payer: Self-pay | Admitting: Occupational Therapy

## 2015-02-05 ENCOUNTER — Inpatient Hospital Stay (HOSPITAL_COMMUNITY): Payer: Self-pay | Admitting: Speech Pathology

## 2015-02-05 DIAGNOSIS — J209 Acute bronchitis, unspecified: Secondary | ICD-10-CM | POA: Diagnosis present

## 2015-02-05 LAB — RENAL FUNCTION PANEL
Albumin: 3.2 g/dL — ABNORMAL LOW (ref 3.5–5.2)
Anion gap: 17 — ABNORMAL HIGH (ref 5–15)
BUN: 61 mg/dL — ABNORMAL HIGH (ref 6–23)
CO2: 23 mmol/L (ref 19–32)
Calcium: 9.4 mg/dL (ref 8.4–10.5)
Chloride: 94 mmol/L — ABNORMAL LOW (ref 96–112)
Creatinine, Ser: 9.09 mg/dL — ABNORMAL HIGH (ref 0.50–1.10)
GFR calc Af Amer: 5 mL/min — ABNORMAL LOW (ref >90)
GFR calc non Af Amer: 5 mL/min — ABNORMAL LOW (ref >90)
Glucose, Bld: 131 mg/dL — ABNORMAL HIGH (ref 70–99)
Phosphorus: 3.8 mg/dL (ref 2.3–4.6)
Potassium: 4.6 mmol/L (ref 3.5–5.1)
Sodium: 134 mmol/L — ABNORMAL LOW (ref 135–145)

## 2015-02-05 LAB — IRON AND TIBC
Iron: 40 ug/dL — ABNORMAL LOW (ref 42–145)
Saturation Ratios: 13 % — ABNORMAL LOW (ref 20–55)
TIBC: 315 ug/dL (ref 250–470)
UIBC: 275 ug/dL (ref 125–400)

## 2015-02-05 LAB — URINE MICROSCOPIC-ADD ON

## 2015-02-05 LAB — URINALYSIS, ROUTINE W REFLEX MICROSCOPIC
Bilirubin Urine: NEGATIVE
GLUCOSE, UA: 100 mg/dL — AB
KETONES UR: NEGATIVE mg/dL
Nitrite: NEGATIVE
PH: 8.5 — AB (ref 5.0–8.0)
Protein, ur: 100 mg/dL — AB
Specific Gravity, Urine: 1.009 (ref 1.005–1.030)
Urobilinogen, UA: 0.2 mg/dL (ref 0.0–1.0)

## 2015-02-05 LAB — CBC
HCT: 25.1 % — ABNORMAL LOW (ref 36.0–46.0)
Hemoglobin: 8.3 g/dL — ABNORMAL LOW (ref 12.0–15.0)
MCH: 32 pg (ref 26.0–34.0)
MCHC: 33.1 g/dL (ref 30.0–36.0)
MCV: 96.9 fL (ref 78.0–100.0)
Platelets: 669 10*3/uL — ABNORMAL HIGH (ref 150–400)
RBC: 2.59 MIL/uL — ABNORMAL LOW (ref 3.87–5.11)
RDW: 17.1 % — ABNORMAL HIGH (ref 11.5–15.5)
WBC: 17.5 10*3/uL — ABNORMAL HIGH (ref 4.0–10.5)

## 2015-02-05 LAB — GLUCOSE, CAPILLARY
GLUCOSE-CAPILLARY: 112 mg/dL — AB (ref 70–99)
GLUCOSE-CAPILLARY: 110 mg/dL — AB (ref 70–99)
Glucose-Capillary: 211 mg/dL — ABNORMAL HIGH (ref 70–99)
Glucose-Capillary: 125 mg/dL — ABNORMAL HIGH (ref 70–99)
Glucose-Capillary: 91 mg/dL (ref 70–99)

## 2015-02-05 LAB — FERRITIN: Ferritin: 1455 ng/mL — ABNORMAL HIGH (ref 10–291)

## 2015-02-05 MED ORDER — BENZONATATE 100 MG PO CAPS
100.0000 mg | ORAL_CAPSULE | Freq: Three times a day (TID) | ORAL | Status: DC
  Administered 2015-02-05 – 2015-02-10 (×16): 100 mg via ORAL
  Filled 2015-02-05 (×21): qty 1

## 2015-02-05 MED ORDER — LEVOTHYROXINE SODIUM 25 MCG PO TABS
25.0000 ug | ORAL_TABLET | Freq: Every day | ORAL | Status: DC
  Administered 2015-02-06 – 2015-02-11 (×6): 25 ug via ORAL
  Filled 2015-02-05 (×7): qty 1

## 2015-02-05 MED ORDER — LEVOTHYROXINE SODIUM 25 MCG PO TABS: 25.0000 ug | ORAL_TABLET | Freq: Every day | ORAL | Status: DC

## 2015-02-05 MED ORDER — LEVOFLOXACIN 500 MG PO TABS
500.0000 mg | ORAL_TABLET | ORAL | Status: AC
  Administered 2015-02-06: 500 mg via ORAL
  Filled 2015-02-05: qty 1

## 2015-02-05 NOTE — Progress Notes (Signed)
Continues to have intermittent cough. Will schedule tessalon perles.  Will complete Levaquin tomorrow for one week antibiotic course for bronchitis.  WBC remains elevated. Will continue to monitor for signs of infection. Will check UA/UCS

## 2015-02-05 NOTE — Progress Notes (Signed)
Speech Language Pathology Daily Session Note  Patient Details  Name: Natalie [name] MRN: [id number] Date of Birth: February 10, 1971  Today's Date: 02/05/2015 SLP Individual Time: 1300-1400 SLP Individual Time Calculation (min): 60 min  Short Term Goals: Week 1: SLP Short Term Goal 1 (Week 1): Pt will use increased vocal intensity and slow rate to achieve intelligibiltiy at the phrase/sentence level with min verbal and visual cues.   SLP Short Term Goal 2 (Week 1): Pt will recall at least 2 dysarthria strategies with min verbal and visual cues over 2 consecutive sessions.  SLP Short Term Goal 3 (Week 1): Pt will return demonstration of diaphragmatic breathing exercises to improve breath support for speech with min instructional cues over 2 consecutive sessions.  SLP Short Term Goal 4 (Week 1): Pt will consume dys 3 textures and thin liquids with supervision cues for use of lingual sweep and liquid wash to clear right sided buccal residue over 3 consecutive sessions prior to diet advancement.      Skilled Therapeutic Interventions:  Pt was seen for skilled ST targeting goals for dysarthria as well as for family education.  Upon arrival, pt was seated upright in recliner, awake, alert, and agreeable to participate in ST.  Pt's niece, Natalie [name], arrived shortly after session began; as a result, SLP initiated skilled education regarding pt's current goals and progress in therapy.  SLP informed pt's niece that it is anticipated pt therapies that pt will need 24/7 supervision at discharge.  Natalie [name] verbalized understanding of this and reported that pt required a significant amount of care prior to admission and was seldom left at home alone before her stroke.  Pt's niece endorses that pt is not significantly altered from her cognitive baseline, stating that pt had difficulty following directions, recalling information, and difficulty with problem solving prior to admission. SLP instructed pt's niece on targeted  oral motor exercises to strengthen right sided labial, lingual, and buccal musculature with handout and encouraged pt and pt's niece to complete exercises in between therapy sessions.  Pt and pt's niece verbalized understanding.  Pt required min assist verbal and visual cues to return demonstration of the abovementioned exercises.  Pt also recalled 3 out of 3 targeted dysarthria strategies from previous therapy sessions with min question cues.  Continue per current plan of care.    FIM:  Comprehension Comprehension Mode: Auditory Comprehension: 4-Understands basic 75 - 89% of the time/requires cueing 10 - 24% of the time Expression Expression Mode: Verbal Expression: 3-Expresses basic 50 - 74% of the time/requires cueing 25 - 50% of the time. Needs to repeat parts of sentences. Social Interaction Social Interaction: 4-Interacts appropriately 75 - 89% of the time - Needs redirection for appropriate language or to initiate interaction. Problem Solving Problem Solving: 5-Solves basic 90% of the time/requires cueing < 10% of the time Memory Memory: 4-Recognizes or recalls 75 - 89% of the time/requires cueing 10 - 24% of the time  Pain Pain Assessment Pain Assessment: No/denies pain  Therapy/Group: Individual Therapy  [name], Melanee SpryNicole L 02/05/2015, 3:24 PM

## 2015-02-05 NOTE — Progress Notes (Signed)
Occupational Therapy Session Note  Patient Details  Name: Natalie [name] MRN: [id number] Date of Birth: 10/23/1971  Today's Date: 02/05/2015 OT Individual Time: 0930-1030 OT Individual Time Calculation (min): 60 min    Short Term Goals: Week 1:  OT Short Term Goal 1 (Week 1): Pt will perform 3/3 toileting tasks with min A OT Short Term Goal 2 (Week 1): Pt will stand to perform 2/2 grooming task with min A  OT Short Term Goal 3 (Week 1): Pt will use right UE in functional task at gross A with max cuing  OT Short Term Goal 4 (Week 1): Pt will tolerate 30 min of activity with 2 rest breaks to increase endurance.   Skilled Therapeutic Interventions/Progress Updates:    1:1 self care retraining with focus on functional ambulation with attention to right LE, use of right UE at gross A to nondominant with mod cuing, sit to stand, dynamic standing balance, encouragement of bilateral LE for forced use of right UE, functional problem solving, etc. Pt gathered clothing from drawers with right. Pt ambulated around room with min A with AD. Pt with increased incorporation  of right UE in tasks with less cuing (verbal and tactile) than day of eval in bathing and dressing tasks. Dynamic standing balance with steady A during task of picking items off floor with right UE (cleaning up bathroom- towels and clothes). Pt left iwth RW sitting in recliner.   Interpreter present for session.   Therapy Documentation Precautions:  Precautions Precautions: Fall Precaution Comments: HD  Restrictions Weight Bearing Restrictions: No Pain:  no c/o pain  ADL: ADL ADL Comments: see FIM  See FIM for current functional status  Therapy/Group: Individual Therapy  Roney MansSmith, [name] [facility] 02/05/2015, 3:21 PM

## 2015-02-05 NOTE — Progress Notes (Signed)
Physical Therapy Session Note  Patient Details  Name: Natalie [name] MRN: [id number] Date of Birth: May 09, 1971  Today's Date: 02/05/2015 PT Individual Time: 1530-1600 PT Individual Time Calculation (min): 30 min   Short Term Goals: Week 1:  PT Short Term Goal 1 (Week 1): Pt will perform supine<>sit mod A with HOB flat, no rails with mod A and 50% cueing. PT Short Term Goal 2 (Week 1): Pt will perform bed <> chair stand pivot transfers with min A and 25% cues PT Short Term Goal 3 (Week 1): Pt will ambulate x50' with LRAD in controlled environment with mod A of single therapist PT Short Term Goal 4 (Week 1): Pt will maintain dynamic standing balance without UE support, with supervision and min cues.   Skilled Therapeutic Interventions/Progress Updates:   Pt received lying in bed, niece present in room but did not attend session.  Ambulated to/from therapy gym without AD at min A level with light facilitation at trunk for upright posture and increased stride length.  Once in therapy gym, had pt stand with LLE on 8-10" step to further increase WB through RLE and to increase hip abd strength while putting 25 piece puzzle together using BUEs for RUE fine motor and coordination.  Requires mod to max A for placing correct pieces of puzzle and also light cues for decreased pushing for support on tray table, but tolerated standing x 13-15 mins while working puzzle.  Ambulated back to room as above.  Left in bed with all needs in reach and family present.    Therapy Documentation Precautions:  Precautions Precautions: Fall Precaution Comments: HD  Restrictions Weight Bearing Restrictions: No   Vital Signs: Therapy Vitals Temp: 98.1 F (36.7 C) Temp Source: Oral Pulse Rate: 75 Resp: 18 BP: 110/67 mmHg Patient Position (if appropriate): Lying Oxygen Therapy SpO2: 99 % O2 Device: Not Delivered Pain: Pain Assessment Pain Assessment: No/denies pain   Locomotion  : Ambulation Ambulation/Gait Assistance: 4: Min assist   See FIM for current functional status  Therapy/Group: Individual Therapy  Vista Deckarcell, Adriene Padula Ann 02/05/2015, 4:20 PM

## 2015-02-05 NOTE — Progress Notes (Signed)
44 y.o. [place] speaking female with history of hypertension,mild MR, ESRD-HD TTS who was admitted on 01/23/2015 with difficulty swallowing with drooling, dysarthria, R-sided weakness with increased difficulty walking. MRI of the brain showed acute brainstem infarct, left paracentral pons lacune with no mass effect as well as chronic lacunar infarcts of the right pons and left basal ganglia corona radiata. Carotid Dopplers without significant ICA stenosis. 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   Subjective/Complaints: Sister in room , speaks a little English, Left upper back sore  ROS-limited English  Objective: Vital Signs: Blood pressure 110/55, pulse 52, temperature 99.3 F (37.4 C), temperature source Oral, resp. rate 18, weight 54.6 kg (120 lb 5.9 oz), last menstrual period 12/23/2014, SpO2 95 %. No results found. Results for orders placed or performed during the hospital encounter of 02/01/15 (from the past 72 hour(s))  Glucose, capillary     Status: Abnormal   Collection Time: 02/02/15 12:04 PM  Result Value Ref Range   Glucose-Capillary 178 (H) 70 - 99 mg/dL   Comment 1 Notify RN   Glucose, capillary     Status: Abnormal   Collection Time: 02/02/15  4:52 PM  Result Value Ref Range   Glucose-Capillary 140 (H) 70 - 99 mg/dL   Comment 1 Notify RN   CBC     Status: Abnormal   Collection Time: 02/02/15  8:15 PM  Result Value Ref Range   WBC 15.8 (H) 4.0 - 10.5 K/uL   RBC 2.74 (L) 3.87 - 5.11 MIL/uL   Hemoglobin 8.7 (L) 12.0 - 15.0 g/dL   HCT 26.1 (L) 36.0 - 46.0 %   MCV 95.3 78.0 - 100.0 fL   MCH 31.8 26.0 - 34.0 pg   MCHC 33.3 30.0 - 36.0 g/dL   RDW 16.7 (H) 11.5 - 15.5 %   Platelets 670 (H) 150 - 400 K/uL  Renal function panel     Status: Abnormal   Collection Time: 02/02/15  8:15 PM  Result Value Ref Range   Sodium 131 (L) 135 - 145 mmol/L   Potassium 4.8 3.5 - 5.1 mmol/L    Chloride 92 (L) 96 - 112 mmol/L   CO2 21 19 - 32 mmol/L   Glucose, Bld 155 (H) 70 - 99 mg/dL   BUN 69 (H) 6 - 23 mg/dL   Creatinine, Ser 10.83 (H) 0.50 - 1.10 mg/dL   Calcium 9.0 8.4 - 10.5 mg/dL   Phosphorus 3.8 2.3 - 4.6 mg/dL   Albumin 3.5 3.5 - 5.2 g/dL   GFR calc non Af Amer 4 (L) >90 mL/min   GFR calc Af Amer 4 (L) >90 mL/min    Comment: (NOTE) The eGFR has been calculated using the CKD EPI equation. This calculation has not been validated in all clinical situations. eGFR's persistently <90 mL/min signify possible Chronic Kidney Disease.    Anion gap 18 (H) 5 - 15  Glucose, capillary     Status: Abnormal   Collection Time: 02/02/15 11:26 PM  Result Value Ref Range   Glucose-Capillary 133 (H) 70 - 99 mg/dL  Glucose, capillary     Status: Abnormal   Collection Time: 02/03/15  6:49 AM  Result Value Ref Range   Glucose-Capillary 116 (H) 70 - 99 mg/dL  Glucose, capillary     Status: Abnormal   Collection Time: 02/03/15 11:36 AM  Result Value Ref Range   Glucose-Capillary 112 (H) 70 - 99 mg/dL  Glucose, capillary     Status: Abnormal   Collection Time: 02/03/15  4:49 PM  Result Value Ref Range   Glucose-Capillary 102 (H) 70 - 99 mg/dL  Glucose, capillary     Status: None   Collection Time: 02/03/15  8:58 PM  Result Value Ref Range   Glucose-Capillary 82 70 - 99 mg/dL  Glucose, capillary     Status: Abnormal   Collection Time: 02/04/15  6:49 AM  Result Value Ref Range   Glucose-Capillary 45 (L) 70 - 99 mg/dL  Glucose, capillary     Status: Abnormal   Collection Time: 02/04/15  7:24 AM  Result Value Ref Range   Glucose-Capillary 66 (L) 70 - 99 mg/dL  Glucose, capillary     Status: Abnormal   Collection Time: 02/04/15  8:55 AM  Result Value Ref Range   Glucose-Capillary 106 (H) 70 - 99 mg/dL  Glucose, capillary     Status: Abnormal   Collection Time: 02/04/15 12:18 PM  Result Value Ref Range   Glucose-Capillary 159 (H) 70 - 99 mg/dL  Glucose, capillary     Status:  Abnormal   Collection Time: 02/04/15  5:39 PM  Result Value Ref Range   Glucose-Capillary 49 (L) 70 - 99 mg/dL  Glucose, capillary     Status: None   Collection Time: 02/04/15  6:20 PM  Result Value Ref Range   Glucose-Capillary 73 70 - 99 mg/dL  Glucose, capillary     Status: Abnormal   Collection Time: 02/04/15  9:45 PM  Result Value Ref Range   Glucose-Capillary 126 (H) 70 - 99 mg/dL  CBC     Status: Abnormal   Collection Time: 02/05/15 12:05 AM  Result Value Ref Range   WBC 17.5 (H) 4.0 - 10.5 K/uL   RBC 2.59 (L) 3.87 - 5.11 MIL/uL   Hemoglobin 8.3 (L) 12.0 - 15.0 g/dL   HCT 25.1 (L) 36.0 - 46.0 %   MCV 96.9 78.0 - 100.0 fL   MCH 32.0 26.0 - 34.0 pg   MCHC 33.1 30.0 - 36.0 g/dL   RDW 17.1 (H) 11.5 - 15.5 %   Platelets 669 (H) 150 - 400 K/uL  Renal function panel     Status: Abnormal   Collection Time: 02/05/15 12:05 AM  Result Value Ref Range   Sodium 134 (L) 135 - 145 mmol/L   Potassium 4.6 3.5 - 5.1 mmol/L   Chloride 94 (L) 96 - 112 mmol/L   CO2 23 19 - 32 mmol/L   Glucose, Bld 131 (H) 70 - 99 mg/dL   BUN 61 (H) 6 - 23 mg/dL   Creatinine, Ser 9.09 (H) 0.50 - 1.10 mg/dL   Calcium 9.4 8.4 - 10.5 mg/dL   Phosphorus 3.8 2.3 - 4.6 mg/dL   Albumin 3.2 (L) 3.5 - 5.2 g/dL   GFR calc non Af Amer 5 (L) >90 mL/min   GFR calc Af Amer 5 (L) >90 mL/min    Comment: (NOTE) The eGFR has been calculated using the CKD EPI equation. This calculation has not been validated in all clinical situations. eGFR's persistently <90 mL/min signify possible Chronic Kidney Disease.    Anion gap 17 (H) 5 - 15     HEENT: normal Cardio: RRR Resp: CTA B/L and unlabored GI: BS positive, Tender and NT,ND Extremity:  Pulses positive and No Edema Skin:   Intact Neuro: Alert, follows commands with gestural cues Normal Motor and Abnormal Motor 3- R delt bi tri, 3- grip, 3- HF,  KE 3- ankle Musc/Skel: Mild tenderness in Left periscapular muscles, no swelling  Gen NAD   Assessment/Plan: 1.  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 Physiatrist is providing close team supervision and 24 hour management of active medical problems listed below. Physiatrist and rehab team continue to assess barriers to discharge/monitor patient progress toward functional and medical goals.  FIM: FIM - Bathing Bathing Steps Patient Completed: Chest, Right Arm, Abdomen, Front perineal area, Right upper leg, Left upper leg Bathing: 3: Mod-Patient completes 5-7 [id number] parts or 50-74%  FIM - Upper Body Dressing/Undressing Upper body dressing/undressing steps patient completed: Thread/unthread left sleeve of pullover shirt/dress, Put head through opening of pull over shirt/dress, Thread/unthread right sleeve of front closure shirt/dress, Thread/unthread left sleeve of front closure shirt/dress, Pull shirt around back of front closure shirt/dress Upper body dressing/undressing: 3: Mod-Patient completed 50-74% of tasks FIM - Lower Body Dressing/Undressing Lower body dressing/undressing steps patient completed: Thread/unthread right underwear leg, Thread/unthread left underwear leg, Thread/unthread left pants leg, Don/Doff right sock, Don/Doff left sock, Don/Doff right shoe Lower body dressing/undressing: 3: Mod-Patient completed 50-74% of tasks  FIM - Toileting Toileting steps completed by patient: Adjust clothing prior to toileting, Performs perineal hygiene, Adjust clothing after toileting Toileting Assistive Devices: Grab bar or rail for support Toileting: 4: Steadying assist  FIM - TRadio producerDevices: Grab bars Toilet Transfers: 4-To toilet/BSC: Min A (steadying Pt. > 75%), 4-From toilet/BSC: Min A (steadying Pt. > 75%)  FIM - Bed/Chair Transfer Bed/Chair Transfer Assistive Devices: Bed rails Bed/Chair Transfer: 4: Supine  > Sit: Min A (steadying Pt. > 75%/lift 1 leg), 4: Sit > Supine: Min A (steadying pt. > 75%/lift 1 leg)  FIM - Locomotion: Wheelchair Distance: 20 Locomotion: Wheelchair: 0: Activity did not occur FIM - Locomotion: Ambulation Locomotion: Ambulation Assistive Devices: Other (comment) (R HHA) Ambulation/Gait Assistance: 4: Min assist Locomotion: Ambulation: 4: Travels 150 ft or more with minimal assistance (Pt.>75%)  Comprehension Comprehension Mode: Auditory Comprehension: 4-Understands basic 75 - 89% of the time/requires cueing 10 - 24% of the time  Expression Expression Mode: Verbal Expression: 3-Expresses basic 50 - 74% of the time/requires cueing 25 - 50% of the time. Needs to repeat parts of sentences.  Social Interaction Social Interaction: 4-Interacts appropriately 75 - 89% of the time - Needs redirection for appropriate language or to initiate interaction.  Problem Solving Problem Solving: 5-Solves basic 90% of the time/requires cueing < 10% of the time  Memory Memory: 4-Recognizes or recalls 75 - 89% of the time/requires cueing 10 - 24% of the time  Medical Problem List and Plan: 1. 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 DVT Prophylaxis/Anticoagulation: Pharmaceutical: Lovenox  3. Pain Management: tylenol, Kpad for periscapular myofascial pain 4. Mood: team to provide egosupport as possible 5. Neuropsych: This patient is not capable of making decisions on her own behalf. 6. Skin/Wound Care: encourage adequate nutrition, pressure relief,  7. Fluids/Electrolytes/Nutrition: renal diet 1200cc FR 8. DM type 2:uncontrolled, d/c hs lantus insulin , cont  SSI, started glucotrol recently will d/c 9. ESRD: HD per renal service, dialysis to occur after therapy completed, TTS schedule 10. Dysphagia: tolerating regular consistency diet at present 11. HTN: bp controlled at present, volume mgt with HD 12. Anemia of chronic  disease: aranesp  LOS (Days) 4 A FACE TO FACE EVALUATION WAS PERFORMED  KAlysia Penna  E 02/05/2015, 7:39 AM

## 2015-02-05 NOTE — Progress Notes (Signed)
Physical Therapy Session Note  Patient Details  Name: Natalie [name] MRN: [id number] Date of Birth: [birth year]  Today's Date: 02/05/2015 PT Individual Time: 1100-1200 PT Individual Time Calculation (min): 60 min   Short Term Goals: Week 1:  PT Short Term Goal 1 (Week 1): Pt will perform supine<>sit mod A with HOB flat, no rails with mod A and 50% cueing. PT Short Term Goal 2 (Week 1): Pt will perform bed <> chair stand pivot transfers with min A and 25% cues PT Short Term Goal 3 (Week 1): Pt will ambulate x50' with LRAD in controlled environment with mod A of single therapist PT Short Term Goal 4 (Week 1): Pt will maintain dynamic standing balance without UE support, with supervision and min cues.   Skilled Therapeutic Interventions/Progress Updates:   Pt received siting in recliner, agreeable to therapy session.  Pt voicing she needed to use the restroom prior to leaving room.  Ambulated to/from restroom with HHA at min A level.  Pt able to adjust clothing and perform peri care at steadying A level.  Ambulated to sink to wash/dry hands at close S level.  Ambulated to/from therapy gym >150' with L HHA in order to facilitate increased gait speed for decreased fall risk and also to facilitate increased stride length and wider L step due to slight continued scissoring.  Performed seated nustep x 6 mins at level 4 resistance with BLEs only for proprioceptive feedback and NMR through RLE.  Note marked improvement in RLE alignment during activity.  Progressed to supine NMR exercises with BLE bridging x 20 reps, RLE only bridging x 10 reps, and SL hip abd x 2 sets of 10 reps.  Trailed gait with use of SPC vs RW for safe use.  Performed 90' gait x 2 reps with each at min A level with use of SPC and HOH assist for proper placement of cane, however with RW did somewhat better and needed only min/guard to close S for gait.  Discussed with pt through interpretor that we would still work on gait without  device to improve gait and challenge balance however that if she needed device to increase independence we could address that next week.  Pt verbalized understanding.  Ambulated to ortho gym as above (facilitation at trunk for light pelvic rotation to increase step lengths).  Performed car transfer at min A level and educated that this would not be difficult for pt at time of D/C.  Discussed using LiteGait tomorrow for improved gait pattern. Pt in agreement to attempt.  Assisted pt back to room and left in recliner with all needs in reach and quick release belt donned.   Therapy Documentation Precautions:  Precautions Precautions: Fall Precaution Comments: HD  Restrictions Weight Bearing Restrictions: No    Pain: pt with no c/o pain during session.   See FIM for current functional status  Therapy/Group: Individual Therapy  Vista Deckarcell, Jakorian Marengo Ann 02/05/2015, 11:15 AM

## 2015-02-06 ENCOUNTER — Inpatient Hospital Stay (HOSPITAL_COMMUNITY): Payer: MEDICAID | Admitting: Physical Therapy

## 2015-02-06 ENCOUNTER — Inpatient Hospital Stay (HOSPITAL_COMMUNITY): Payer: MEDICAID | Admitting: Speech Pathology

## 2015-02-06 LAB — GLUCOSE, CAPILLARY
GLUCOSE-CAPILLARY: 122 mg/dL — AB (ref 70–99)
GLUCOSE-CAPILLARY: 242 mg/dL — AB (ref 70–99)
Glucose-Capillary: 77 mg/dL (ref 70–99)

## 2015-02-06 LAB — RENAL FUNCTION PANEL
ALBUMIN: 3.4 g/dL — AB (ref 3.5–5.2)
Anion gap: 10 (ref 5–15)
BUN: 37 mg/dL — ABNORMAL HIGH (ref 6–23)
CO2: 27 mmol/L (ref 19–32)
Calcium: 9.5 mg/dL (ref 8.4–10.5)
Chloride: 96 mmol/L (ref 96–112)
Creatinine, Ser: 6.97 mg/dL — ABNORMAL HIGH (ref 0.50–1.10)
GFR calc Af Amer: 7 mL/min — ABNORMAL LOW (ref >90)
GFR, EST NON AFRICAN AMERICAN: 6 mL/min — AB (ref >90)
Glucose, Bld: 63 mg/dL — ABNORMAL LOW (ref 70–99)
PHOSPHORUS: 6.1 mg/dL — AB (ref 2.3–4.6)
Potassium: 4.7 mmol/L (ref 3.5–5.1)
SODIUM: 133 mmol/L — AB (ref 135–145)

## 2015-02-06 LAB — CBC
HCT: 27 % — ABNORMAL LOW (ref 36.0–46.0)
Hemoglobin: 8.7 g/dL — ABNORMAL LOW (ref 12.0–15.0)
MCH: 31.6 pg (ref 26.0–34.0)
MCHC: 32.2 g/dL (ref 30.0–36.0)
MCV: 98.2 fL (ref 78.0–100.0)
Platelets: 652 10*3/uL — ABNORMAL HIGH (ref 150–400)
RBC: 2.75 MIL/uL — AB (ref 3.87–5.11)
RDW: 17 % — ABNORMAL HIGH (ref 11.5–15.5)
WBC: 13.4 10*3/uL — ABNORMAL HIGH (ref 4.0–10.5)

## 2015-02-06 NOTE — Progress Notes (Signed)
Speech Language Pathology Daily Session Note  Patient Details  Name: Natalie [name] MRN: [id number] Date of Birth: Feb 25, 1971  Today's Date: 02/06/2015 SLP Individual Time: 1130-1200 SLP Individual Time Calculation (min): 30 min  Short Term Goals: Week 1: SLP Short Term Goal 1 (Week 1): Pt will use increased vocal intensity and slow rate to achieve intelligibiltiy at the phrase/sentence level with min verbal and visual cues.   SLP Short Term Goal 2 (Week 1): Pt will recall at least 2 dysarthria strategies with min verbal and visual cues over 2 consecutive sessions.  SLP Short Term Goal 3 (Week 1): Pt will return demonstration of diaphragmatic breathing exercises to improve breath support for speech with min instructional cues over 2 consecutive sessions.  SLP Short Term Goal 4 (Week 1): Pt will consume dys 3 textures and thin liquids with supervision cues for use of lingual sweep and liquid wash to clear right sided buccal residue over 3 consecutive sessions prior to diet advancement.      Skilled Therapeutic Interventions: Skilled ST intervention provided with focus on dysphagia goals. Pt seated upright 90 degrees in bed, upon slp entry. Snack provided of D3/thin liquid consistencies to target swallow safety. Mod verbal and visual cues required to take small sips and slow pace. No overt s/s of aspiration noted with all consistencies. Slp trialed D4 consistency, with appropriate bolus manipulation noted, liquid wash required x1 to clear min oral stasis. Pt used gestures to indicate that she needed to use restroom. Mod verbal and visual cues required to redirect pt to call bell and not to get up from bed unassisted. Pt independently pressed correct bottom on call light, slp assisted in communicating requests. Pt left with staff member, in restroom. Continue current POC.    FIM:  Comprehension Comprehension Mode: Auditory Comprehension: 4-Understands basic 75 - 89% of the time/requires cueing  10 - 24% of the time Expression Expression Mode: Verbal Expression: 4-Expresses basic 75 - 89% of the time/requires cueing 10 - 24% of the time. Needs helper to occlude trach/needs to repeat words. Social Interaction Social Interaction: 5-Interacts appropriately 90% of the time - Needs monitoring or encouragement for participation or interaction. Problem Solving Problem Solving: 4-Solves basic 75 - 89% of the time/requires cueing 10 - 24% of the time Memory Memory: 4-Recognizes or recalls 75 - 89% of the time/requires cueing 10 - 24% of the time FIM - Eating Eating Activity: 5: Supervision/cues  Pain Pain Assessment Pain Assessment: No/denies pain  Therapy/Group: Individual Therapy  Patt Steinhardt, Kara PacerLeah N 02/06/2015, 12:57 PM

## 2015-02-06 NOTE — Progress Notes (Signed)
44 y.o. [place] speaking female with history of hypertension,mild MR, ESRD-HD TTS who was admitted on 01/23/2015 with difficulty swallowing with drooling, dysarthria, R-sided weakness with increased difficulty walking. MRI of the brain showed acute brainstem infarct, left paracentral pons lacune with no mass effect as well as chronic lacunar infarcts of the right pons and left basal ganglia corona radiata. Carotid Dopplers without significant ICA stenosis. 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   Subjective/Complaints: Per RN, starting to measure voids, only 3 recorded CBG improved this am  ROS-limited English  Objective: Vital Signs: Blood pressure 134/71, pulse 78, temperature 97.4 F (36.3 C), temperature source Oral, resp. rate 18, weight 56.1 kg (123 lb 10.9 oz), last menstrual period 12/23/2014, SpO2 97 %. No results found. Results for orders placed or performed during the hospital encounter of 02/01/15 (from the past 72 hour(s))  Glucose, capillary     Status: Abnormal   Collection Time: 02/03/15 11:36 AM  Result Value Ref Range   Glucose-Capillary 112 (H) 70 - 99 mg/dL  Glucose, capillary     Status: Abnormal   Collection Time: 02/03/15  4:49 PM  Result Value Ref Range   Glucose-Capillary 102 (H) 70 - 99 mg/dL  Glucose, capillary     Status: None   Collection Time: 02/03/15  8:58 PM  Result Value Ref Range   Glucose-Capillary 82 70 - 99 mg/dL  Glucose, capillary     Status: Abnormal   Collection Time: 02/04/15  6:49 AM  Result Value Ref Range   Glucose-Capillary 45 (L) 70 - 99 mg/dL  Glucose, capillary     Status: Abnormal   Collection Time: 02/04/15  7:24 AM  Result Value Ref Range   Glucose-Capillary 66 (L) 70 - 99 mg/dL  Glucose, capillary     Status: Abnormal   Collection Time: 02/04/15  8:55 AM  Result Value Ref Range   Glucose-Capillary 106 (H) 70 - 99 mg/dL  Glucose,  capillary     Status: Abnormal   Collection Time: 02/04/15 12:18 PM  Result Value Ref Range   Glucose-Capillary 159 (H) 70 - 99 mg/dL  Glucose, capillary     Status: Abnormal   Collection Time: 02/04/15  5:39 PM  Result Value Ref Range   Glucose-Capillary 49 (L) 70 - 99 mg/dL  Glucose, capillary     Status: None   Collection Time: 02/04/15  6:20 PM  Result Value Ref Range   Glucose-Capillary 73 70 - 99 mg/dL  Glucose, capillary     Status: Abnormal   Collection Time: 02/04/15  9:45 PM  Result Value Ref Range   Glucose-Capillary 126 (H) 70 - 99 mg/dL  CBC     Status: Abnormal   Collection Time: 02/05/15 12:05 AM  Result Value Ref Range   WBC 17.5 (H) 4.0 - 10.5 K/uL   RBC 2.59 (L) 3.87 - 5.11 MIL/uL   Hemoglobin 8.3 (L) 12.0 - 15.0 g/dL   HCT 25.1 (L) 36.0 - 46.0 %   MCV 96.9 78.0 - 100.0 fL   MCH 32.0 26.0 - 34.0 pg   MCHC 33.1 30.0 - 36.0 g/dL   RDW 17.1 (H) 11.5 - 15.5 %   Platelets 669 (H) 150 - 400 K/uL  Renal function panel     Status: Abnormal   Collection Time: 02/05/15 12:05 AM  Result Value Ref Range   Sodium 134 (L) 135 - 145 mmol/L   Potassium 4.6  3.5 - 5.1 mmol/L   Chloride 94 (L) 96 - 112 mmol/L   CO2 23 19 - 32 mmol/L   Glucose, Bld 131 (H) 70 - 99 mg/dL   BUN 61 (H) 6 - 23 mg/dL   Creatinine, Ser 9.09 (H) 0.50 - 1.10 mg/dL   Calcium 9.4 8.4 - 10.5 mg/dL   Phosphorus 3.8 2.3 - 4.6 mg/dL   Albumin 3.2 (L) 3.5 - 5.2 g/dL   GFR calc non Af Amer 5 (L) >90 mL/min   GFR calc Af Amer 5 (L) >90 mL/min    Comment: (NOTE) The eGFR has been calculated using the CKD EPI equation. This calculation has not been validated in all clinical situations. eGFR's persistently <90 mL/min signify possible Chronic Kidney Disease.    Anion gap 17 (H) 5 - 15  Ferritin     Status: Abnormal   Collection Time: 02/05/15 12:05 AM  Result Value Ref Range   Ferritin 1455 (H) 10 - 291 ng/mL    Comment: Performed at [facility]  Iron and TIBC     Status: Abnormal    Collection Time: 02/05/15 12:05 AM  Result Value Ref Range   Iron 40 (L) 42 - 145 ug/dL   TIBC 315 250 - 470 ug/dL   Saturation Ratios 13 (L) 20 - 55 %   UIBC 275 125 - 400 ug/dL    Comment: Performed at [facility]  Glucose, capillary     Status: Abnormal   Collection Time: 02/05/15  3:32 AM  Result Value Ref Range   Glucose-Capillary 112 (H) 70 - 99 mg/dL  Glucose, capillary     Status: Abnormal   Collection Time: 02/05/15  7:38 AM  Result Value Ref Range   Glucose-Capillary 110 (H) 70 - 99 mg/dL  Glucose, capillary     Status: Abnormal   Collection Time: 02/05/15 12:04 PM  Result Value Ref Range   Glucose-Capillary 211 (H) 70 - 99 mg/dL  Glucose, capillary     Status: Abnormal   Collection Time: 02/05/15  4:31 PM  Result Value Ref Range   Glucose-Capillary 125 (H) 70 - 99 mg/dL  Glucose, capillary     Status: None   Collection Time: 02/05/15  9:21 PM  Result Value Ref Range   Glucose-Capillary 91 70 - 99 mg/dL  Urinalysis, Routine w reflex microscopic     Status: Abnormal   Collection Time: 02/05/15  9:32 PM  Result Value Ref Range   Color, Urine YELLOW YELLOW   APPearance CLOUDY (A) CLEAR   Specific Gravity, Urine 1.009 1.005 - 1.030   pH 8.5 (H) 5.0 - 8.0   Glucose, UA 100 (A) NEGATIVE mg/dL   Hgb urine dipstick SMALL (A) NEGATIVE   Bilirubin Urine NEGATIVE NEGATIVE   Ketones, ur NEGATIVE NEGATIVE mg/dL   Protein, ur 100 (A) NEGATIVE mg/dL   Urobilinogen, UA 0.2 0.0 - 1.0 mg/dL   Nitrite NEGATIVE NEGATIVE   Leukocytes, UA MODERATE (A) NEGATIVE  Urine microscopic-add on     Status: Abnormal   Collection Time: 02/05/15  9:32 PM  Result Value Ref Range   Squamous Epithelial / LPF MANY (A) RARE   WBC, UA TOO NUMEROUS TO COUNT <3 WBC/hpf   RBC / HPF 3-6 <3 RBC/hpf   Bacteria, UA FEW (A) RARE  Renal function panel     Status: Abnormal   Collection Time: 02/06/15  6:11 AM  Result Value Ref Range   Sodium 133 (L) 135 - 145 mmol/L  Potassium 4.7 3.5 - 5.1  mmol/L   Chloride 96 96 - 112 mmol/L   CO2 27 19 - 32 mmol/L   Glucose, Bld 63 (L) 70 - 99 mg/dL   BUN 37 (H) 6 - 23 mg/dL    Comment: DELTA CHECK NOTED   Creatinine, Ser 6.97 (H) 0.50 - 1.10 mg/dL   Calcium 9.5 8.4 - 10.5 mg/dL   Phosphorus 6.1 (H) 2.3 - 4.6 mg/dL   Albumin 3.4 (L) 3.5 - 5.2 g/dL   GFR calc non Af Amer 6 (L) >90 mL/min   GFR calc Af Amer 7 (L) >90 mL/min    Comment: (NOTE) The eGFR has been calculated using the CKD EPI equation. This calculation has not been validated in all clinical situations. eGFR's persistently <90 mL/min signify possible Chronic Kidney Disease.    Anion gap 10 5 - 15  CBC     Status: Abnormal   Collection Time: 02/06/15  6:11 AM  Result Value Ref Range   WBC 13.4 (H) 4.0 - 10.5 K/uL   RBC 2.75 (L) 3.87 - 5.11 MIL/uL   Hemoglobin 8.7 (L) 12.0 - 15.0 g/dL   HCT 27.0 (L) 36.0 - 46.0 %   MCV 98.2 78.0 - 100.0 fL   MCH 31.6 26.0 - 34.0 pg   MCHC 32.2 30.0 - 36.0 g/dL   RDW 17.0 (H) 11.5 - 15.5 %   Platelets 652 (H) 150 - 400 K/uL  Glucose, capillary     Status: None   Collection Time: 02/06/15  7:10 AM  Result Value Ref Range   Glucose-Capillary 77 70 - 99 mg/dL     HEENT: normal Cardio: RRR Resp: CTA B/L and unlabored GI: BS positive, Tender and NT,ND Extremity:  Pulses positive and No Edema Skin:   Intact Neuro: Alert, follows commands with gestural cues Normal Motor and Abnormal Motor 3- R delt bi tri, 3- grip, 3- HF, KE 3- ankle Musc/Skel: Mild tenderness in Left periscapular muscles, no swelling  Gen NAD   Assessment/Plan: 1. 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 Physiatrist is providing close team supervision and 24 hour management of active medical problems listed below. Physiatrist and rehab team continue to assess barriers to discharge/monitor patient progress toward  functional and medical goals.  FIM: FIM - Bathing Bathing Steps Patient Completed: Chest, Right Arm, Abdomen, Front perineal area, Right upper leg, Left upper leg Bathing: 3: Mod-Patient completes 5-7 75f10 parts or 50-74%  FIM - Upper Body Dressing/Undressing Upper body dressing/undressing steps patient completed: Thread/unthread left sleeve of pullover shirt/dress, Put head through opening of pull over shirt/dress, Thread/unthread right sleeve of front closure shirt/dress, Thread/unthread left sleeve of front closure shirt/dress, Pull shirt around back of front closure shirt/dress Upper body dressing/undressing: 3: Mod-Patient completed 50-74% of tasks FIM - Lower Body Dressing/Undressing Lower body dressing/undressing steps patient completed: Thread/unthread right underwear leg, Thread/unthread left underwear leg, Thread/unthread left pants leg, Don/Doff right sock, Don/Doff left sock, Don/Doff right shoe Lower body dressing/undressing: 3: Mod-Patient completed 50-74% of tasks  FIM - Toileting Toileting steps completed by patient: Adjust clothing prior to toileting, Performs perineal hygiene, Adjust clothing after toileting Toileting Assistive Devices: Grab bar or rail for support Toileting: 4: Steadying assist  FIM - TRadio producerDevices: Grab bars Toilet Transfers: 4-To toilet/BSC: Min A (steadying Pt. > 75%), 4-From toilet/BSC: Min A (steadying Pt. >  75%)  FIM - Bed/Chair Transfer Bed/Chair Transfer Assistive Devices: Bed rails Bed/Chair Transfer: 5: Sit > Supine: Supervision (verbal cues/safety issues), 5: Supine > Sit: Supervision (verbal cues/safety issues)  FIM - Locomotion: Wheelchair Distance: 20 Locomotion: Wheelchair: 0: Activity did not occur FIM - Locomotion: Ambulation Locomotion: Ambulation Assistive Devices: Other (comment) (L HHA) Ambulation/Gait Assistance: 4: Min assist Locomotion: Ambulation: 4: Travels 150 ft or more with minimal  assistance (Pt.>75%)  Comprehension Comprehension Mode: Auditory (Simultaneous filing. User may not have seen previous data.) Comprehension: 4-Understands basic 75 - 89% of the time/requires cueing 10 - 24% of the time (Simultaneous filing. User may not have seen previous data.)  Expression Expression Mode: Verbal (Simultaneous filing. User may not have seen previous data.) Expression: 4-Expresses basic 75 - 89% of the time/requires cueing 10 - 24% of the time. Needs helper to occlude trach/needs to repeat words. (Simultaneous filing. User may not have seen previous data.)  Social Interaction Social Interaction: 4-Interacts appropriately 75 - 89% of the time - Needs redirection for appropriate language or to initiate interaction. (Simultaneous filing. User may not have seen previous data.)  Problem Solving Problem Solving: 5-Solves basic 90% of the time/requires cueing < 10% of the time  Memory Memory: 4-Recognizes or recalls 75 - 89% of the time/requires cueing 10 - 24% of the time  Medical Problem List and Plan: 1. 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 DVT Prophylaxis/Anticoagulation: Pharmaceutical: Lovenox  3. Pain Management: tylenol, Kpad for periscapular myofascial pain 4. Mood: team to provide egosupport as possible 5. Neuropsych: This patient is not capable of making decisions on her own behalf. 6. Skin/Wound Care: encourage adequate nutrition, pressure relief,  7. Fluids/Electrolytes/Nutrition: renal diet 1200cc FR 8. DM type 2:uncontrolled, d/c hs lantus insulin , cont  SSI, started glucotrol recently will d/c 9. ESRD: HD per renal service, dialysis to occur after therapy completed, TTS schedule 10. Dysphagia: tolerating regular consistency diet at present 11. HTN: bp controlled at present, volume mgt with HD 12. Anemia of chronic disease: aranesp 13.  UTI- on levaquin, await sens LOS (Days) 5 A FACE TO FACE  EVALUATION WAS PERFORMED  Sallie Maker E 02/06/2015, 8:30 AM

## 2015-02-06 NOTE — Plan of Care (Signed)
Problem: RH BOWEL ELIMINATION Goal: RH STG MANAGE BOWEL WITH ASSISTANCE STG Manage Bowel with Assistance. Mod I  Outcome: Not Progressing C/o constipation meds given

## 2015-02-06 NOTE — Progress Notes (Signed)
  [facility] Progress Note   Subjective: alert, no complaints  Filed Vitals:   02/05/15 0250 02/05/15 0400 02/05/15 1500 02/06/15 0700  BP: 147/75 110/55 110/67 134/71  Pulse: 85 52 75 78  Temp: 98.1 F (36.7 C) 99.3 F (37.4 C) 98.1 F (36.7 C) 97.4 F (36.3 C)  TempSrc: Oral Oral Oral Oral  Resp: 16 18 18 18   Weight: 55.1 kg (121 lb 7.6 oz) 54.6 kg (120 lb 5.9 oz)  56.1 kg (123 lb 10.9 oz)  SpO2: 98% 95% 99% 97%   Exam: Lying in bed, smiling, no distress No jvd Chest clear bilat RRR no MRG Abd soft, NTND, no ascites No LE or UE edema Neuro is nf, good strength all 4 ext RUA AVF patent   HD: [facility] 4h 60.5kg 2/2 Bath Heparin 3500 RUA AVF Mircera 50 q 4 wks, Calcitriol 0.25ug tiw Last pth 639, ferr 1473        Assessment: 1. Acute CVA (pons) / R hemiparesis / dysarthria - on rehab, per primary 2. ESRD on HD 3. Volume- no BP meds, BP good, 3-4 kg under prior dry wt 4. Anemia on darbe 60/wk 5. 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 DM per primary   Plan - HD today     Natalie [name] Cherolyn Behrle MD  pager [phone]    cell [phone]  02/06/2015, 9:56 AM     Recent Labs Lab 02/02/15 2015 02/05/15 0005 02/06/15 0611  NA 131* 134* 133*  K 4.8 4.6 4.7  CL 92* 94* 96  CO2 21 23 27   GLUCOSE 155* 131* 63*  BUN 69* 61* 37*  CREATININE 10.83* 9.09* 6.97*  CALCIUM 9.0 9.4 9.5  PHOS 3.8 3.8 6.1*    Recent Labs Lab 02/02/15 2015 02/05/15 0005 02/06/15 0611  ALBUMIN 3.5 3.2* 3.4*    Recent Labs Lab 02/02/15 2015 02/05/15 0005 02/06/15 0611  WBC 15.8* 17.5* 13.4*  HGB 8.7* 8.3* 8.7*  HCT 26.1* 25.1* 27.0*  MCV 95.3 96.9 98.2  PLT 670* 669* 652*   . atorvastatin  80 mg Oral q1800  . benzonatate  100 mg Oral TID  . calcitRIOL  0.25 mcg Oral Q M,W,F-HD  . clopidogrel  75 mg Oral Daily  . darbepoetin (ARANESP) injection - DIALYSIS  60 mcg Intravenous Q Sat-HD  . docusate sodium  100 mg Oral BID  . enoxaparin (LOVENOX) injection  30 mg  Subcutaneous Q24H  . fenofibrate  160 mg Oral Daily  . insulin aspart  0-5 Units Subcutaneous QHS  . insulin aspart  0-9 Units Subcutaneous TID WC  . levofloxacin  500 mg Oral Q48H  . levothyroxine  25 mcg Oral QAC breakfast  . multivitamin  1 tablet Oral QHS  . MUSCLE RUB   Topical TID AC  . sucroferric oxyhydroxide  500 mg Oral TID WC     acetaminophen, albuterol, aluminum hydroxide, calcium carbonate (dosed in mg elemental calcium), camphor-menthol **AND** hydrOXYzine, docusate sodium, guaifenesin, guaiFENesin-dextromethorphan, menthol-cetylpyridinium, ondansetron **OR** ondansetron (ZOFRAN) IV, sodium chloride, sorbitol, zolpidem

## 2015-02-06 NOTE — Progress Notes (Signed)
Physical Therapy Session Note  Patient Details  Name: Natalie [name] MRN: [id number] Date of Birth: 1971/09/03  Today's Date: 02/06/2015 PT Individual Time: 1030-1100 PT Individual Time Calculation (min): 30 min   Short Term Goals: Week 1:  PT Short Term Goal 1 (Week 1): Pt will perform supine<>sit mod A with HOB flat, no rails with mod A and 50% cueing. PT Short Term Goal 2 (Week 1): Pt will perform bed <> chair stand pivot transfers with min A and 25% cues PT Short Term Goal 3 (Week 1): Pt will ambulate x50' with LRAD in controlled environment with mod A of single therapist PT Short Term Goal 4 (Week 1): Pt will maintain dynamic standing balance without UE support, with supervision and min cues.   Skilled Therapeutic Interventions/Progress Updates:    Gait Training: PT instructs pt in ambulation x 150' x 2 req R HHA and no AD, with manual facilitation for L & R arm swing and up to min A for balance - pt has poor carryover of arm swing without repeated tactile cues.   Neuromuscular Reeducation: PT instructs pt in B UE use to hold a cup of water and sip from a straw for functional R UE NMR.  PT instructs pt in side stepping R and L in hallway req min A-mod A for balance x 30' each direction - PT provides verbal cues for smaller step when leading with L leg and larger step when leading with R leg, in Spanish.  PT instructs pt in high level functional balance task of making the bed in the transitional apartment req min-mod A with one large LOB req max A to prevent a fall - pt ambulates multiple times around the bed, often times approximating B UEs, which assists in R arm NMR. PT provides occasional forced use of R UE only to straighten out sheet & comforter, but also allows B manual use during this functional task.   No interpreter present, but communication enabled through this PT's small knowledge of Spanish with use of pantomime. Pt ended in bed with bed alarm on, hob elevated, all needs  in reach. Continue with high level balance and R side NMR re-training per PT POC.   Therapy Documentation Precautions:  Precautions Precautions: Fall Precaution Comments: HD  Restrictions Weight Bearing Restrictions: No Pain: Pain Assessment Pain Assessment: No/denies pain   See FIM for current functional status  Therapy/Group: Individual Therapy  Charnell Peplinski M 02/06/2015, 7:58 AM

## 2015-02-07 ENCOUNTER — Inpatient Hospital Stay (HOSPITAL_COMMUNITY): Payer: Self-pay | Admitting: Occupational Therapy

## 2015-02-07 ENCOUNTER — Inpatient Hospital Stay (HOSPITAL_COMMUNITY): Payer: Self-pay | Admitting: Physical Therapy

## 2015-02-07 LAB — URINE CULTURE
Colony Count: NO GROWTH
Culture: NO GROWTH

## 2015-02-07 LAB — GLUCOSE, CAPILLARY
GLUCOSE-CAPILLARY: 121 mg/dL — AB (ref 70–99)
Glucose-Capillary: 130 mg/dL — ABNORMAL HIGH (ref 70–99)
Glucose-Capillary: 145 mg/dL — ABNORMAL HIGH (ref 70–99)
Glucose-Capillary: 112 mg/dL — ABNORMAL HIGH (ref 70–99)

## 2015-02-07 MED ORDER — SENNOSIDES-DOCUSATE SODIUM 8.6-50 MG PO TABS
2.0000 | ORAL_TABLET | Freq: Two times a day (BID) | ORAL | Status: DC
  Administered 2015-02-07 – 2015-02-10 (×7): 2 via ORAL
  Filled 2015-02-07 (×10): qty 2

## 2015-02-07 MED ORDER — DARBEPOETIN ALFA 60 MCG/0.3ML IJ SOSY
60.0000 ug | PREFILLED_SYRINGE | INTRAMUSCULAR | Status: DC
  Filled 2015-02-07: qty 0.3

## 2015-02-07 MED ORDER — LORATADINE 10 MG PO TABS
10.0000 mg | ORAL_TABLET | Freq: Every day | ORAL | Status: DC
  Administered 2015-02-07 – 2015-02-10 (×4): 10 mg via ORAL
  Filled 2015-02-07 (×7): qty 1

## 2015-02-07 NOTE — Plan of Care (Signed)
Problem: RH BLADDER ELIMINATION Goal: RH STG MANAGE BLADDER WITH ASSISTANCE STG Manage Bladder With Assistance. Mod I  Outcome: Not Progressing Patient is oliguric on HD

## 2015-02-07 NOTE — Progress Notes (Signed)
Physical Therapy Session Note  Patient Details  Name: Natalie [name] MRN: [id number] Date of Birth: 12/31/1970  Today's Date: 02/07/2015 PT Individual Time: 0810-0900, 1425-1535 PT Individual Time Calculation (min): 50 min, 70 min  Short Term Goals: Week 1:  PT Short Term Goal 1 (Week 1): Pt will perform supine<>sit mod A with HOB flat, no rails with mod A and 50% cueing. PT Short Term Goal 2 (Week 1): Pt will perform bed <> chair stand pivot transfers with min A and 25% cues PT Short Term Goal 3 (Week 1): Pt will ambulate x50' with LRAD in controlled environment with mod A of single therapist PT Short Term Goal 4 (Week 1): Pt will maintain dynamic standing balance without UE support, with supervision and min cues.   Skilled Therapeutic Interventions/Progress Updates:    Pt with decreased RUE arm swing and decreased weight bearing over RLE, indicative of some learned non-use as compensation post CVA. Pt would continue to benefit from forced use activities to address these deficits. Pt with B/L decreased glute activation in developmental exercises, that are difficult to recruit even with cueing. Pt would continue to benefit from individualized skilled PT services to increase functional mobility.   Therapy Documentation Precautions:  Precautions Precautions: Fall Precaution Comments: HD  Restrictions Weight Bearing Restrictions: No General: PT Amount of Missed Time (min): 10 Minutes PT Missed Treatment Reason:  (sister defers secondary to pt eating) Pain: Pain Assessment Pain Assessment: No/denies pain Mobility:  Min A with cues for safety and technique Locomotion : Ambulation Ambulation/Gait Assistance: 4: Min assist  Other Treatments:  Tx 1: RUE forced use, supporting own RUE, holding object, with pre-gait 2x10. Dynamic gait holding object 2x10. Static standing 30"x3. Pt educated on rehab plan and safety in mobility. Scap mobs in all planes followed by AAROM of shoulder 2x10.  Pt performs transfers x15. Active rest sitting with weight shifts and reahcing with RUE forced use. Hip abd/add isometrics, LAQs, ankle pumps, marching 2x10. Tx 2: Pt performs prone lying as active rest. Quadraped with static holds 15"x3. Quadraped with UE flexion followed by hip ext 2x10. Prone knee flexion then hip ext 2x10. Tall kneel holds 15"x2. Pt educated on rehab plan and safety in mobility. BUE cane shoulder flexion. Transfers x20. Pt performs reaching with BUE to higher surfaces with duster and sweeping floor, zippering jacket  within functional context. Soft tissue stretches of into T/S ext, L/S ext and lateral ext 3x30" followed by reaching activities. Piriformis stretch 2 x30". Heel raises, hip ER AROM 2x10.   See FIM for current functional status  Therapy/Group: Individual Therapy  Christia ReadingKinney, Deaaron Fulghum G 02/07/2015, 8:46 AM

## 2015-02-07 NOTE — Progress Notes (Signed)
Occupational Therapy Session Note  Patient Details  Name: Natalie [name] MRN: [id number] Date of Birth: April 09, 1971  Today's Date: 02/07/2015 OT Individual Time: 1030-1130 OT Individual Time Calculation (min): 60 min   Short Term Goals: Week 1:  OT Short Term Goal 1 (Week 1): Pt will perform 3/3 toileting tasks with min A OT Short Term Goal 2 (Week 1): Pt will stand to perform 2/2 grooming task with min A  OT Short Term Goal 3 (Week 1): Pt will use right UE in functional task at gross A with max cuing  OT Short Term Goal 4 (Week 1): Pt will tolerate 30 min of activity with 2 rest breaks to increase endurance.   Skilled Therapeutic Interventions/Progress Updates:  Patient received supine in bed with niece present at bedside. Niece made comment that she wanted to "be invisible" secondary to she feels like patient is relying on her too much. Patient engaged in bed mobility using bed rails with min assist. Patient then sat EOB to don bilateral shoes. Patient ambulated into BR for toilet transfer, toileting, then shower stall transfer. Patient completed UB/LB bathing in sit<>stand position. Cues required for sequencing and completion of bathing, patient tended to perseverate on rinsing off with water. Patient dried, then ambulated back to room for UB/LB dressing in sit<>stand position from EOB. Patient ambulated > sink for grooming task in standing position. Encouraged and recommended patient use RUE as much as possible. At end of session, left patient supine in bed with all needs within reach and niece present at bedside.   Therapy Documentation Precautions:  Precautions Precautions: Fall Precaution Comments: HD  Restrictions Weight Bearing Restrictions: No  ADL: ADL ADL Comments: see FIM  See FIM for current functional status  Therapy/Group: Individual Therapy  Jacinda Kanady 02/07/2015, 11:42 AM

## 2015-02-07 NOTE — Progress Notes (Signed)
Patient's niece by bedside stated that Patient had noticed a lump above her Graft site on the right upper arm. On assessment nurse confirmed lump under skin. Rapid response nurse notified for further assessment.

## 2015-02-08 ENCOUNTER — Inpatient Hospital Stay (HOSPITAL_COMMUNITY): Payer: MEDICAID | Admitting: Speech Pathology

## 2015-02-08 ENCOUNTER — Inpatient Hospital Stay (HOSPITAL_COMMUNITY): Payer: Self-pay | Admitting: Rehabilitation

## 2015-02-08 ENCOUNTER — Inpatient Hospital Stay (HOSPITAL_COMMUNITY): Payer: MEDICAID

## 2015-02-08 DIAGNOSIS — E0821 Diabetes mellitus due to underlying condition with diabetic nephropathy: Secondary | ICD-10-CM

## 2015-02-08 DIAGNOSIS — N186 End stage renal disease: Secondary | ICD-10-CM

## 2015-02-08 LAB — GLUCOSE, CAPILLARY
GLUCOSE-CAPILLARY: 148 mg/dL — AB (ref 70–99)
Glucose-Capillary: 101 mg/dL — ABNORMAL HIGH (ref 70–99)
Glucose-Capillary: 158 mg/dL — ABNORMAL HIGH (ref 70–99)
Glucose-Capillary: 220 mg/dL — ABNORMAL HIGH (ref 70–99)

## 2015-02-08 NOTE — Progress Notes (Signed)
44 y.o. [place] speaking female with history of hypertension,mild MR, ESRD-HD TTS who was admitted on 01/23/2015 with difficulty swallowing with drooling, dysarthria, R-sided weakness with increased difficulty walking. MRI of the brain showed acute brainstem infarct, left paracentral pons lacune with no mass effect as well as chronic lacunar infarcts of the right pons and left basal ganglia corona radiata. Carotid Dopplers without significant ICA stenosis. 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   Subjective/Complaints: Per RN, starting to measure voids, only 3 recorded CBG improved this am  ROS-limited English  Objective: Vital Signs: Blood pressure 143/85, pulse 83, temperature 98.4 F (36.9 C), temperature source Oral, resp. rate 18, weight 56.6 kg (124 lb 12.5 oz), last menstrual period 12/23/2014, SpO2 98 %. No results found. Results for orders placed or performed during the hospital encounter of 02/01/15 (from the past 72 hour(s))  Glucose, capillary     Status: Abnormal   Collection Time: 02/05/15 12:04 PM  Result Value Ref Range   Glucose-Capillary 211 (H) 70 - 99 mg/dL  Glucose, capillary     Status: Abnormal   Collection Time: 02/05/15  4:31 PM  Result Value Ref Range   Glucose-Capillary 125 (H) 70 - 99 mg/dL  Glucose, capillary     Status: None   Collection Time: 02/05/15  9:21 PM  Result Value Ref Range   Glucose-Capillary 91 70 - 99 mg/dL  Urinalysis, Routine w reflex microscopic     Status: Abnormal   Collection Time: 02/05/15  9:32 PM  Result Value Ref Range   Color, Urine YELLOW YELLOW   APPearance CLOUDY (A) CLEAR   Specific Gravity, Urine 1.009 1.005 - 1.030   pH 8.5 (H) 5.0 - 8.0   Glucose, UA 100 (A) NEGATIVE mg/dL   Hgb urine dipstick SMALL (A) NEGATIVE   Bilirubin Urine NEGATIVE NEGATIVE   Ketones, ur NEGATIVE NEGATIVE mg/dL   Protein, ur 100 (A) NEGATIVE mg/dL    Urobilinogen, UA 0.2 0.0 - 1.0 mg/dL   Nitrite NEGATIVE NEGATIVE   Leukocytes, UA MODERATE (A) NEGATIVE  Urine culture     Status: None   Collection Time: 02/05/15  9:32 PM  Result Value Ref Range   Specimen Description URINE, CLEAN CATCH    Special Requests Levaquin    Colony Count NO GROWTH Performed at [facility]     Culture NO GROWTH Performed at [facility]     Report Status 02/07/2015 FINAL   Urine microscopic-add on     Status: Abnormal   Collection Time: 02/05/15  9:32 PM  Result Value Ref Range   Squamous Epithelial / LPF MANY (A) RARE   WBC, UA TOO NUMEROUS TO COUNT <3 WBC/hpf   RBC / HPF 3-6 <3 RBC/hpf   Bacteria, UA FEW (A) RARE  Renal function panel     Status: Abnormal   Collection Time: 02/06/15  6:11 AM  Result Value Ref Range   Sodium 133 (L) 135 - 145 mmol/L   Potassium 4.7 3.5 - 5.1 mmol/L   Chloride 96 96 - 112 mmol/L   CO2 27 19 - 32 mmol/L   Glucose, Bld 63 (L) 70 - 99 mg/dL   BUN 37 (H) 6 - 23 mg/dL    Comment: DELTA CHECK NOTED   Creatinine, Ser 6.97 (H) 0.50 - 1.10 mg/dL   Calcium 9.5 8.4 - 10.5 mg/dL   Phosphorus 6.1 (H) 2.3 - 4.6 mg/dL  Albumin 3.4 (L) 3.5 - 5.2 g/dL   GFR calc non Af Amer 6 (L) >90 mL/min   GFR calc Af Amer 7 (L) >90 mL/min    Comment: (NOTE) The eGFR has been calculated using the CKD EPI equation. This calculation has not been validated in all clinical situations. eGFR's persistently <90 mL/min signify possible Chronic Kidney Disease.    Anion gap 10 5 - 15  CBC     Status: Abnormal   Collection Time: 02/06/15  6:11 AM  Result Value Ref Range   WBC 13.4 (H) 4.0 - 10.5 K/uL   RBC 2.75 (L) 3.87 - 5.11 MIL/uL   Hemoglobin 8.7 (L) 12.0 - 15.0 g/dL   HCT 27.0 (L) 36.0 - 46.0 %   MCV 98.2 78.0 - 100.0 fL   MCH 31.6 26.0 - 34.0 pg   MCHC 32.2 30.0 - 36.0 g/dL   RDW 17.0 (H) 11.5 - 15.5 %   Platelets 652 (H) 150 - 400 K/uL  Glucose, capillary     Status: None   Collection Time: 02/06/15  7:10 AM   Result Value Ref Range   Glucose-Capillary 77 70 - 99 mg/dL  Glucose, capillary     Status: Abnormal   Collection Time: 02/06/15 11:33 AM  Result Value Ref Range   Glucose-Capillary 122 (H) 70 - 99 mg/dL  Glucose, capillary     Status: Abnormal   Collection Time: 02/06/15 10:41 PM  Result Value Ref Range   Glucose-Capillary 242 (H) 70 - 99 mg/dL  Glucose, capillary     Status: Abnormal   Collection Time: 02/07/15  6:55 AM  Result Value Ref Range   Glucose-Capillary 130 (H) 70 - 99 mg/dL  Glucose, capillary     Status: Abnormal   Collection Time: 02/07/15 12:03 PM  Result Value Ref Range   Glucose-Capillary 145 (H) 70 - 99 mg/dL   Comment 1 Notify RN   Glucose, capillary     Status: Abnormal   Collection Time: 02/07/15  4:38 PM  Result Value Ref Range   Glucose-Capillary 121 (H) 70 - 99 mg/dL   Comment 1 Notify RN   Glucose, capillary     Status: Abnormal   Collection Time: 02/07/15  8:47 PM  Result Value Ref Range   Glucose-Capillary 112 (H) 70 - 99 mg/dL  Glucose, capillary     Status: Abnormal   Collection Time: 02/08/15  6:36 AM  Result Value Ref Range   Glucose-Capillary 101 (H) 70 - 99 mg/dL     HEENT: normal Cardio: RRR Resp: CTA B/L and unlabored GI: BS positive, Tender and NT,ND Extremity:  Right AV fistula with good thrill Skin:   Intact Neuro: Alert, follows commands with gestural cues Normal Motor and Abnormal Motor 3- R delt bi tri, 3- grip, 3- HF, KE 3- ankle Musc/Skel: Mild tenderness in Left periscapular muscles, no swelling  Gen NAD   Assessment/Plan: 1. 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 Physiatrist is providing close team supervision and 24 hour management of active medical problems listed below. Physiatrist and rehab team continue to assess barriers to discharge/monitor patient progress toward  functional and medical goals.  FIM: FIM - Bathing Bathing Steps Patient Completed: Chest, Right Arm, Abdomen, Front perineal area, Right upper leg, Left upper leg, Left Arm, Right lower leg (including foot), Left lower leg (including foot), Buttocks Bathing: 5: Supervision: Safety issues/verbal  cues  FIM - Upper Body Dressing/Undressing Upper body dressing/undressing steps patient completed: Thread/unthread left sleeve of pullover shirt/dress, Put head through opening of pull over shirt/dress, Pull shirt over trunk, Thread/unthread right bra strap, Thread/unthread left bra strap, Thread/unthread right sleeve of pullover shirt/dresss Upper body dressing/undressing: 4: Min-Patient completed 75 plus % of tasks FIM - Lower Body Dressing/Undressing Lower body dressing/undressing steps patient completed: Thread/unthread right underwear leg, Thread/unthread left underwear leg, Thread/unthread left pants leg, Don/Doff right sock, Don/Doff left sock, Don/Doff right shoe, Pull underwear up/down, Thread/unthread right pants leg, Pull pants up/down, Don/Doff left shoe Lower body dressing/undressing: 5: Supervision: Safety issues/verbal cues  FIM - Toileting Toileting steps completed by patient: Adjust clothing prior to toileting, Performs perineal hygiene, Adjust clothing after toileting Toileting Assistive Devices: Grab bar or rail for support Toileting: 4: Steadying assist  FIM - Radio producer Devices: Grab bars Toilet Transfers: 4-To toilet/BSC: Min A (steadying Pt. > 75%), 4-From toilet/BSC: Min A (steadying Pt. > 75%)  FIM - Bed/Chair Transfer Bed/Chair Transfer Assistive Devices: Bed rails Bed/Chair Transfer: 4: Supine > Sit: Min A (steadying Pt. > 75%/lift 1 leg), 5: Sit > Supine: Supervision (verbal cues/safety issues), 4: Bed > Chair or W/C: Min A (steadying Pt. > 75%)  FIM - Locomotion: Wheelchair Distance: 20 Locomotion: Wheelchair: 0: Activity did not  occur FIM - Locomotion: Ambulation Locomotion: Ambulation Assistive Devices: Other (comment) (L HHA) Ambulation/Gait Assistance: 4: Min assist Locomotion: Ambulation: 4: Travels 150 ft or more with minimal assistance (Pt.>75%)  Comprehension Comprehension Mode: Auditory Comprehension: 4-Understands basic 75 - 89% of the time/requires cueing 10 - 24% of the time  Expression Expression Mode: Verbal Expression: 4-Expresses basic 75 - 89% of the time/requires cueing 10 - 24% of the time. Needs helper to occlude trach/needs to repeat words.  Social Interaction Social Interaction: 5-Interacts appropriately 90% of the time - Needs monitoring or encouragement for participation or interaction.  Problem Solving Problem Solving: 5-Solves basic 90% of the time/requires cueing < 10% of the time  Memory Memory: 4-Recognizes or recalls 75 - 89% of the time/requires cueing 10 - 24% of the time  Medical Problem List and Plan: 1. 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 DVT Prophylaxis/Anticoagulation: Pharmaceutical: Lovenox  3. Pain Management: tylenol, Kpad for periscapular myofascial pain 4. Mood: team to provide egosupport as possible 5. Neuropsych: This patient is not capable of making decisions on her own behalf. 6. Skin/Wound Care: encourage adequate nutrition, pressure relief,  7. Fluids/Electrolytes/Nutrition: renal diet 1200cc FR 8. DM type 2:uncontrolled, d/c hs lantus insulin , cont  SSI, started glucotrol recently will d/c 9. ESRD: HD per renal service, dialysis to occur after therapy completed, TTS schedule 10. Dysphagia: tolerating regular consistency diet at present 11. HTN: bp controlled at present, volume mgt with HD 12. Anemia of chronic disease: aranesp 13.  UTI- on levaquin, cx no growth will d/c LOS (Days) 7 A FACE TO FACE EVALUATION WAS PERFORMED  Alpha Chouinard E 02/08/2015, 7:44 AM

## 2015-02-08 NOTE — Progress Notes (Signed)
Speech Language Pathology Daily Session Note  Patient Details  Name: Natalie [name] MRN: [id number] Date of Birth: [birth year]  Today's Date: 02/08/2015 SLP Individual Time: 1100-1200 SLP Individual Time Calculation (min): 60 min  Short Term Goals: Week 1: SLP Short Term Goal 1 (Week 1): Pt will use increased vocal intensity and slow rate to achieve intelligibiltiy at the phrase/sentence level with min verbal and visual cues.   SLP Short Term Goal 1 - Progress (Week 1): Progressing toward goal SLP Short Term Goal 2 (Week 1): Pt will recall at least 2 dysarthria strategies with min verbal and visual cues over 2 consecutive sessions.  SLP Short Term Goal 2 - Progress (Week 1): Progressing toward goal SLP Short Term Goal 3 (Week 1): Pt will return demonstration of diaphragmatic breathing exercises to improve breath support for speech with min instructional cues over 2 consecutive sessions.  SLP Short Term Goal 3 - Progress (Week 1): Progressing toward goal SLP Short Term Goal 4 (Week 1): Pt will consume dys 3 textures and thin liquids with supervision cues for use of lingual sweep and liquid wash to clear right sided buccal residue over 3 consecutive sessions prior to diet advancement.     SLP Short Term Goal 4 - Progress (Week 1): Progressing toward goal  Skilled Therapeutic Interventions: Skilled ST intervention provided with focus on dysphagia and intelligibility goals. Pt seated upright 90 degrees in chair, interpreter present. Family arrived at the end of the session.  Pt was observed throughout the session drinking water via straw sips. No cues were necessary for small sips at slow rate. No overt s/s aspiration observed or reported.  Pt independently utilized posted signs to inform SLP of swallow and intelligibility strategies.  Interpreter was asked what she felt the primary issue was re: pt intelligibility and she indicated pt's loudness was the biggest challenge.  SLP reviewed oral  motor strengthening exercises with pt, with particular attention to proper technique. When family arrived, education given regarding exercise completion as well as importance of resistance during exercises, and family was encouraged to complete exercises with patient to improve accuracy. All assigned exercises were completed with 10 reps with appropriate attention to task. Initiated diaphragmatic breathing exercises with max verbal, visual, and tactile cues given to pt.  Unfortunately, pt was seated in chair, which was suboptimal positioning for this task. Reviewed technique with family and encouraged completion with patient to facilitate accurate completion. Niece verbalized 2 concerns to SLP during session: Pt has a painful area on her right upper arm, and pt also has what appears to be a fluid filled sac underneath the right side of her tongue. This sac is irritating to the patient, and has increased in size over the last year. The niece is concerned about it continuing to increase in size, and that it hasn't ever been thorougly evaluated.  It may also be contributing to pt's speech difficulties.   FIM:  Comprehension Comprehension Mode: Auditory Comprehension: 4-Understands basic 75 - 89% of the time/requires cueing 10 - 24% of the time Expression Expression Mode: Verbal Expression: 4-Expresses basic 75 - 89% of the time/requires cueing 10 - 24% of the time. Needs helper to occlude trach/needs to repeat words. Social Interaction Social Interaction: 5-Interacts appropriately 90% of the time - Needs monitoring or encouragement for participation or interaction. Problem Solving Problem Solving: 4-Solves basic 75 - 89% of the time/requires cueing 10 - 24% of the time Memory Memory: 4-Recognizes or recalls 75 - 89% of the time/requires  cueing 10 - 24% of the time FIM - Eating Eating Activity: 5: Supervision/cues  Pain Pain Assessment Pain Assessment: No/denies pain  Therapy/Group: Individual  Therapy   Cathyrn Deas B. [place], [facility], CCC-SLP [phone]  Leigh Aurora 02/08/2015, 2:39 PM

## 2015-02-08 NOTE — Progress Notes (Signed)
Occupational Therapy Session Note  Patient Details  Name: Natalie [name] MRN: [id number] Date of Birth: Sep 08, 1971  Today's Date: 02/08/2015 OT Individual Time: 1000-1100 OT Individual Time Calculation (min): 60 min    Short Term Goals: Week 1:  OT Short Term Goal 1 (Week 1): Pt will perform 3/3 toileting tasks with min A OT Short Term Goal 2 (Week 1): Pt will stand to perform 2/2 grooming task with min A  OT Short Term Goal 3 (Week 1): Pt will use right UE in functional task at gross A with max cuing  OT Short Term Goal 4 (Week 1): Pt will tolerate 30 min of activity with 2 rest breaks to increase endurance.   Skilled Therapeutic Interventions/Progress Updates:    Pt seen for ADL retraining with focus on functional mobility, functional use of RUE, standing balance, and activity tolerance. Pt received supine in bed. Ambulated bed>walk-in shower at min A level with no AD. Completed bathing at setup assist and min A for balance as pt with LOB posteriorly 1x. Pt utilized RUE to assist with washing 30% of body. Pt required min cues for sequencing secondary to perseveration on rinsing water.  Ambulated back to room and completed dressing with increased time and min A for standing balance. Pt completed oral care and brushing hair while standing at sink with supervision for standing balance and pt using RUE as a stabilizer with min cues. Pt folded clothing while in sitting with focus on RUE coordination and bilateral hand use. Pt required increased time and 2 rest breaks due to RUE fatigue. At end of session pt left sitting in recliner chair with all needs in reach.   Therapy Documentation Precautions:  Precautions Precautions: Fall Precaution Comments: HD  Restrictions Weight Bearing Restrictions: No General:   Vital Signs:  Pain:   ADL: ADL ADL Comments: see FIM Exercises:   Other Treatments:    See FIM for current functional status  Therapy/Group: Individual  Therapy  Daneil Danerkinson, Maisee Vollman N 02/08/2015, 12:14 PM

## 2015-02-08 NOTE — Progress Notes (Signed)
Physical Therapy Session Note  Patient Details  Name: Natalie [name] MRN: [id number] Date of Birth: August 14, 1971  Today's Date: 02/08/2015 PT Individual Time: 1300-1415 PT Individual Time Calculation (min): 75 min   Short Term Goals: Week 1:  PT Short Term Goal 1 (Week 1): Pt will perform supine<>sit mod A with HOB flat, no rails with mod A and 50% cueing. PT Short Term Goal 2 (Week 1): Pt will perform bed <> chair stand pivot transfers with min A and 25% cues PT Short Term Goal 3 (Week 1): Pt will ambulate x50' with LRAD in controlled environment with mod A of single therapist PT Short Term Goal 4 (Week 1): Pt will maintain dynamic standing balance without UE support, with supervision and min cues.   Skilled Therapeutic Interventions/Progress Updates:   Pt received sitting in recliner in room, agreeable to therapy session.  Pt requesting to use restroom prior to leaving room. Pt able to void urine in toilet and perform all clothing management and peri care at S level.  Ambulated to sink to wash/dry hands at S level.  Ambulated to/from ADL kitchen in order to participate in making tortillas and preparing chicken in order to make tacos during tomorrow's session.  Once in kitchen, pt able to utilize side stepping and RUE throughout entire session in order to roll out dough, break into pieces and flatten in order to place in frying pan.  Pt with good safety awareness in turning heat on and also turning heat off when finished using pan.  Pt also utilized RUE to clean cabinets and place dirty items into sink. Assisted pt with making chicken for time management however pt did participate in shredding chicken.  Pt able to stand approx 45 mins during session.  Ambulated back to room and left in bed with all needs in reach.  Note that all ingredients placed in fridge for tomorrow's session.    Therapy Documentation Precautions:  Precautions Precautions: Fall Precaution Comments: HD   Restrictions Weight Bearing Restrictions: No  Pain: Pain Assessment Pain Assessment: No/denies pain   Locomotion : Ambulation Ambulation/Gait Assistance: 4: Min guard;5: Supervision   See FIM for current functional status  Therapy/Group: Individual Therapy  Vista Deckarcell, Mariane Burpee Ann 02/08/2015, 4:23 PM

## 2015-02-09 ENCOUNTER — Inpatient Hospital Stay (HOSPITAL_COMMUNITY): Payer: MEDICAID

## 2015-02-09 ENCOUNTER — Inpatient Hospital Stay (HOSPITAL_COMMUNITY): Payer: Self-pay | Admitting: Speech Pathology

## 2015-02-09 ENCOUNTER — Inpatient Hospital Stay (HOSPITAL_COMMUNITY): Payer: Self-pay | Admitting: Rehabilitation

## 2015-02-09 LAB — CBC
HCT: 24.9 % — ABNORMAL LOW (ref 36.0–46.0)
Hemoglobin: 8.2 g/dL — ABNORMAL LOW (ref 12.0–15.0)
MCH: 31.2 pg (ref 26.0–34.0)
MCHC: 32.9 g/dL (ref 30.0–36.0)
MCV: 94.7 fL (ref 78.0–100.0)
Platelets: 618 10*3/uL — ABNORMAL HIGH (ref 150–400)
RBC: 2.63 MIL/uL — ABNORMAL LOW (ref 3.87–5.11)
RDW: 16.6 % — ABNORMAL HIGH (ref 11.5–15.5)
WBC: 11.6 10*3/uL — ABNORMAL HIGH (ref 4.0–10.5)

## 2015-02-09 LAB — RENAL FUNCTION PANEL
Albumin: 3.4 g/dL — ABNORMAL LOW (ref 3.5–5.2)
Anion gap: 13 (ref 5–15)
BUN: 75 mg/dL — AB (ref 6–23)
CHLORIDE: 99 mmol/L (ref 96–112)
CO2: 22 mmol/L (ref 19–32)
Calcium: 9.3 mg/dL (ref 8.4–10.5)
Creatinine, Ser: 9.6 mg/dL — ABNORMAL HIGH (ref 0.50–1.10)
GFR calc non Af Amer: 4 mL/min — ABNORMAL LOW (ref >90)
GFR, EST AFRICAN AMERICAN: 5 mL/min — AB (ref >90)
GLUCOSE: 129 mg/dL — AB (ref 70–99)
POTASSIUM: 5.5 mmol/L — AB (ref 3.5–5.1)
Phosphorus: 4.2 mg/dL (ref 2.3–4.6)
SODIUM: 134 mmol/L — AB (ref 135–145)

## 2015-02-09 LAB — GLUCOSE, CAPILLARY
GLUCOSE-CAPILLARY: 104 mg/dL — AB (ref 70–99)
GLUCOSE-CAPILLARY: 113 mg/dL — AB (ref 70–99)

## 2015-02-09 MED ORDER — ALTEPLASE 2 MG IJ SOLR
2.0000 mg | Freq: Once | INTRAMUSCULAR | Status: DC | PRN
Start: 2015-02-09 — End: 2015-02-09
  Filled 2015-02-09: qty 2

## 2015-02-09 MED ORDER — ALTEPLASE 2 MG IJ SOLR: 2.0000 mg | Freq: Once | INTRAMUSCULAR | Status: DC | PRN

## 2015-02-09 MED ORDER — NEPRO/CARBSTEADY PO LIQD: 237.0000 mL | ORAL | Status: DC | PRN

## 2015-02-09 MED ORDER — DARBEPOETIN ALFA 60 MCG/0.3ML IJ SOSY
PREFILLED_SYRINGE | INTRAMUSCULAR | Status: AC
  Filled 2015-02-09: qty 0.3

## 2015-02-09 MED ORDER — LIDOCAINE-PRILOCAINE 2.5-2.5 % EX CREA: 1.0000 "application " | TOPICAL_CREAM | CUTANEOUS | Status: DC | PRN

## 2015-02-09 MED ORDER — LIDOCAINE HCL (PF) 1 % IJ SOLN: 5.0000 mL | INTRAMUSCULAR | Status: DC | PRN

## 2015-02-09 MED ORDER — NEPRO/CARBSTEADY PO LIQD
237.0000 mL | ORAL | Status: DC | PRN
Start: 2015-02-09 — End: 2015-02-09
  Filled 2015-02-09: qty 237

## 2015-02-09 MED ORDER — HEPARIN SODIUM (PORCINE) 1000 UNIT/ML DIALYSIS: 1000.0000 [IU] | INTRAMUSCULAR | Status: DC | PRN

## 2015-02-09 MED ORDER — SODIUM CHLORIDE 0.9 % IV SOLN: 100.0000 mL | INTRAVENOUS | Status: DC | PRN

## 2015-02-09 MED ORDER — PENTAFLUOROPROP-TETRAFLUOROETH EX AERO: 1.0000 "application " | INHALATION_SPRAY | CUTANEOUS | Status: DC | PRN

## 2015-02-09 MED ORDER — LIDOCAINE-PRILOCAINE 2.5-2.5 % EX CREA
1.0000 "application " | TOPICAL_CREAM | CUTANEOUS | Status: DC | PRN
  Filled 2015-02-09: qty 5

## 2015-02-09 NOTE — Progress Notes (Signed)
Physical Therapy Session Note  Patient Details  Name: Natalie [name] MRN: [id number] Date of Birth: [birth year]  Today's Date: 02/09/2015 PT Individual Time: 1300-1430 PT Individual Time Calculation (min): 90 min   Short Term Goals: Week 1:  PT Short Term Goal 1 (Week 1): Pt will perform supine<>sit mod A with HOB flat, no rails with mod A and 50% cueing. PT Short Term Goal 2 (Week 1): Pt will perform bed <> chair stand pivot transfers with min A and 25% cues PT Short Term Goal 3 (Week 1): Pt will ambulate x50' with LRAD in controlled environment with mod A of single therapist PT Short Term Goal 4 (Week 1): Pt will maintain dynamic standing balance without UE support, with supervision and min cues.   Skilled Therapeutic Interventions/Progress Updates:   Pt received lying in bed, agreeable to therapy.  Performed bed mobility at S level with cues for use of R hand to push up from bed.  Once at [facility], stating needing to use restroom prior to leaving room.  PA in room as well to educate on dialysis medication and importance of taking, pt verbalized understanding.  Ambulated to/from restroom with close S to min/guard A and performed all toileting tasks at S level.  Ambulated to sink to wash/dry hands with cues for safety and getting in front of sink.  Ambulated to/from therapy gym (to gym without AD) x 150' x 2 reps at close S to min/guard level.  Note that kitchen very busy during first part of session, therefore ended with cooking task.  Had pt ambulate remainder of distance to therapy gym.  While in gym, worked on dynamic standing task while on foam airdex and folding laundry and performing mini squats to retrieve clothing items.  Requires intermittent min A during task with continued cues for increased use of RUE and increased weight shift to the R.  Pt requires single seated rest break but stood x 2 reps of 5 mins.  Continued to assess safety with use of AD.  Ambulated with RW to ADL kitchen at S  level, however requires constant cues for upright posture, slower gait speed, positioning inside of RW, and increased step width.  Continue to feel it may be safer to have to ambulate with family at min/guard level than use RW, however will assess family's comfort tomorrow during family training.  Ended session with continued work on cooking task.  Provided HOH assist for cutting items for safety with education to pt to NOT attempt at home and to have S when making tortillas.  Pt verbalized understanding.  Also had pt utilize RUE to stir chicken mixture and serve herself when making tortilla.  Pt did well with task.  Ambulated back to room and left in bed with all needs in reach.    Therapy Documentation Precautions:  Precautions Precautions: Fall Precaution Comments: HD  Restrictions Weight Bearing Restrictions: No  Pain: Pt with no c/o pain during session.    See FIM for current functional status  Therapy/Group: Individual Therapy  Vista Deckarcell, Sonika Levins Ann 02/09/2015, 1:40 PM

## 2015-02-09 NOTE — Procedures (Signed)
I have seen and examined this patient and agree with the plan of care  Seen on dialysis with no acute issues  Natalie [name] W 02/09/2015, 11:04 AM

## 2015-02-09 NOTE — Progress Notes (Signed)
Speech Language Pathology Daily Session Note  Patient Details  Name: Natalie [name] MRN: [id number] Date of Birth: 07-May-1971  Today's Date: 02/09/2015 SLP Individual Time: 1100-1205 SLP Individual Time Calculation (min): 65 min  Short Term Goals: Week 1: SLP Short Term Goal 1 (Week 1): Pt will use increased vocal intensity and slow rate to achieve intelligibiltiy at the phrase/sentence level with min verbal and visual cues.   SLP Short Term Goal 1 - Progress (Week 1): Progressing toward goal SLP Short Term Goal 2 (Week 1): Pt will recall at least 2 dysarthria strategies with min verbal and visual cues over 2 consecutive sessions.  SLP Short Term Goal 2 - Progress (Week 1): Progressing toward goal SLP Short Term Goal 3 (Week 1): Pt will return demonstration of diaphragmatic breathing exercises to improve breath support for speech with min instructional cues over 2 consecutive sessions.  SLP Short Term Goal 3 - Progress (Week 1): Progressing toward goal SLP Short Term Goal 4 (Week 1): Pt will consume dys 3 textures and thin liquids with supervision cues for use of lingual sweep and liquid wash to clear right sided buccal residue over 3 consecutive sessions prior to diet advancement.     SLP Short Term Goal 4 - Progress (Week 1): Progressing toward goal  Skilled Therapeutic Interventions:  Pt was seen for skilled ST targeting goals for dysphagia and dysarthria.  Upon arrival, pt was seated upright in recliner, awake, alert, and agreeable to participate in ST with encouragement.  SLP facilitated the session with a structured generative naming task targeting use of dysarthria strategies in phrases.  Pt utilized increased vocal intensity and coordinated respiration with onset of phonation with supervision cues.  SLP also facilitated the session with skilled observations during presentations of pt's currently prescribed diet.  Pt consumed dys 3 textures and thin liquids with minimal overt s/s of  aspiration characterized by strong reflexive cough x1 immediately following a large sip of thins.  Pt demonstrated no significant buccal residue with solids and exhibited timely and efficient mastication.  Pt will likely be able to tolerate regular textures at discharge; however, given that pt's dentures still do not fit appropriately SLP recommends dys 3 textures while inpatient.  Given that pt is at baseline for cognition, is tolerating a relatively normal diet and is approaching goal level for use of dysarthria strategies.  SLP does not recommend ST follow up at discharge but will follow up while inpatient to reinforce use of compensatory strategies.  CSW made aware.  Continue per current plan of care.    FIM:  Comprehension Comprehension Mode: Auditory Comprehension: 4-Understands basic 75 - 89% of the time/requires cueing 10 - 24% of the time Expression Expression Mode: Verbal Expression: 4-Expresses basic 75 - 89% of the time/requires cueing 10 - 24% of the time. Needs helper to occlude trach/needs to repeat words. Social Interaction Social Interaction: 5-Interacts appropriately 90% of the time - Needs monitoring or encouragement for participation or interaction. Problem Solving Problem Solving: 4-Solves basic 75 - 89% of the time/requires cueing 10 - 24% of the time Memory Memory: 4-Recognizes or recalls 75 - 89% of the time/requires cueing 10 - 24% of the time FIM - Eating Eating Activity: 5: Supervision/cues  Pain Pain Assessment Pain Assessment: No/denies pain  Therapy/Group: Individual Therapy  Natalie [name], Melanee SpryNicole [name] 02/09/2015, 4:29 PM

## 2015-02-09 NOTE — Progress Notes (Signed)
Occupational Therapy Weekly Progress Note  Patient Details  Name: Natalie [name] MRN: [id number] Date of Birth: 14-Feb-1971  Beginning of progress report period: February 02, 2015 End of progress report period: February 09, 2015  Today's Date: 02/09/2015 OT Individual Time: 1000-1100 OT Individual Time Calculation (min): 60 min    Patient has met 4 of 4 short term goals.  Patient has made good progress during this reporting period. Patient currently requires SBA-min A for self-care tasks, specifically for balance. Patient requires min-SBA for functional mobility without AD. Patient utilizes RUE at gross assist level with mod-max cues and demonstrating improved gross coordination. Patient's greatest barriers at this time are decreased balance, decreased functional use of RUE, and decreased activity tolerance.   Patient continues to demonstrate the following deficits: R hemiplegia, decreased coordination, decreased balance, decreased strength, decreased activity tolerance, decreased awareness, decreased attention, decreased problem solving, decreased ability to compensate for deficits, decreased postural control, decreased balance strategies and therefore will continue to benefit from skilled OT intervention to enhance overall performance with BADLs, balance, functional use of RUE/RLE, activity tolerance, and safety.  Patient progressing toward long term goals..  Continue plan of care.  OT Short Term Goals Week 1:  OT Short Term Goal 1 (Week 1): Pt will perform 3/3 toileting tasks with min A OT Short Term Goal 1 - Progress (Week 1): Met OT Short Term Goal 2 (Week 1): Pt will stand to perform 2/2 grooming task with min A  OT Short Term Goal 2 - Progress (Week 1): Met OT Short Term Goal 3 (Week 1): Pt will use right UE in functional task at gross A with max cuing  OT Short Term Goal 3 - Progress (Week 1): Met OT Short Term Goal 4 (Week 1): Pt will tolerate 30 min of activity with 2 rest breaks to  increase endurance.  OT Short Term Goal 4 - Progress (Week 1): Met Week 2:  OT Short Term Goal 1 (Week 2): STGs=LTGs  Skilled Therapeutic Interventions/Progress Updates:    Pt seen for ADL retraining with focus on functional use of RUE, functional mobility, dynamic standing balance, and activity tolerance. Pt received sitting EOB with NT present. Pt ambulated throughout room with CGA-SBA and no AD to retrieve clothing items with RUE and no dropping of items noted. Completed bathing at shower level at supervision and pt initiating use of RUE to manage hand held shower hose. Pt completed dressing sit<>stand from recliner chair with min A for donning L sock and SBA for dynamic standing balance. Ambulated to therapy gym with min A overall. Completed 9 hole peg test as intervention secondary to pt consistently using L hand to assist with positioning of peg in R hand despite max multimodal cues. Pt required 2:40 to complete peg test with RUE. Completed [place] task of zipping zippers and fastening buttons with increased time and pt fastening 2/5 buttons. Pt ambulated back to room with min A overall and left sitting in recliner chair with all needs in reach.   Therapy Documentation Precautions:  Precautions Precautions: Fall Precaution Comments: HD  Restrictions Weight Bearing Restrictions: No General:   Vital Signs: Therapy Vitals Temp: 98.2 F (36.8 C) Temp Source: Oral Pulse Rate: 87 Resp: 17 BP: (!) 173/88 mmHg Patient Position (if appropriate): Lying Oxygen Therapy SpO2: 98 % O2 Device: Not Delivered Pain: No report of pain  See FIM for current functional status  Therapy/Group: Individual Therapy  Duayne Cal 02/09/2015, 6:54 AM

## 2015-02-09 NOTE — Progress Notes (Signed)
44 y.o. RH-non english speaking female with history of hypertension,mild MR, ESRD-HD TTS who was admitted on 01/23/2015 with difficulty swallowing with drooling, dysarthria, R-sided weakness with increased difficulty walking. MRI of the brain showed acute brainstem infarct, left paracentral pons lacune with no mass effect as well as chronic lacunar infarcts of the right pons and left basal ganglia corona radiata. Carotid Dopplers without significant ICA stenosis. 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   Subjective/Complaints: Pt without new issues overnite, sister at bedside   ROS-limited English  Objective: Vital Signs: Blood pressure 173/88, pulse 87, temperature 98.2 F (36.8 C), temperature source Oral, resp. rate 17, weight 57.3 kg (126 lb 5.2 oz), last menstrual period 12/23/2014, SpO2 98 %. No results found. Results for orders placed or performed during the hospital encounter of 02/01/15 (from the past 72 hour(s))  Glucose, capillary     Status: Abnormal   Collection Time: 02/06/15 11:33 AM  Result Value Ref Range   Glucose-Capillary 122 (H) 70 - 99 mg/dL  Glucose, capillary     Status: Abnormal   Collection Time: 02/06/15 10:41 PM  Result Value Ref Range   Glucose-Capillary 242 (H) 70 - 99 mg/dL  Glucose, capillary     Status: Abnormal   Collection Time: 02/07/15  6:55 AM  Result Value Ref Range   Glucose-Capillary 130 (H) 70 - 99 mg/dL  Glucose, capillary     Status: Abnormal   Collection Time: 02/07/15 12:03 PM  Result Value Ref Range   Glucose-Capillary 145 (H) 70 - 99 mg/dL   Comment 1 Notify RN   Glucose, capillary     Status: Abnormal   Collection Time: 02/07/15  4:38 PM  Result Value Ref Range   Glucose-Capillary 121 (H) 70 - 99 mg/dL   Comment 1 Notify RN   Glucose, capillary     Status: Abnormal   Collection Time: 02/07/15  8:47 PM  Result Value Ref Range    Glucose-Capillary 112 (H) 70 - 99 mg/dL  Glucose, capillary     Status: Abnormal   Collection Time: 02/08/15  6:36 AM  Result Value Ref Range   Glucose-Capillary 101 (H) 70 - 99 mg/dL  Glucose, capillary     Status: Abnormal   Collection Time: 02/08/15 11:37 AM  Result Value Ref Range   Glucose-Capillary 148 (H) 70 - 99 mg/dL  Glucose, capillary     Status: Abnormal   Collection Time: 02/08/15  4:27 PM  Result Value Ref Range   Glucose-Capillary 158 (H) 70 - 99 mg/dL  Glucose, capillary     Status: Abnormal   Collection Time: 02/08/15  9:34 PM  Result Value Ref Range   Glucose-Capillary 220 (H) 70 - 99 mg/dL  Glucose, capillary     Status: Abnormal   Collection Time: 02/09/15  7:10 AM  Result Value Ref Range   Glucose-Capillary 104 (H) 70 - 99 mg/dL     HEENT: normal Cardio: RRR Resp: CTA B/L and unlabored GI: BS positive, Tender and NT,ND Extremity:  Right AV fistula with good thrill Skin:   Bruising R upper arm Neuro: Alert, follows commands with gestural cues Normal Motor and Abnormal Motor 3- R delt bi tri, 3- grip, 3- HF, KE 3- ankle Musc/Skel: Mild tenderness in Left periscapular muscles, no swelling  Gen NAD   Assessment/Plan: 1. 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 Physiatrist is providing close team supervision and 24 hour management of active medical problems listed below. Physiatrist and rehab team continue to assess barriers to discharge/monitor patient progress toward functional and medical goals.  FIM: FIM - Bathing Bathing Steps Patient Completed: Chest, Right Arm, Abdomen, Front perineal area, Right upper leg, Left upper leg, Left Arm, Right lower leg (including foot), Left lower leg (including foot), Buttocks Bathing: 4: Steadying assist  FIM - Upper Body Dressing/Undressing Upper body  dressing/undressing steps patient completed: Thread/unthread left sleeve of pullover shirt/dress, Put head through opening of pull over shirt/dress, Pull shirt over trunk, Thread/unthread right sleeve of pullover shirt/dresss, Thread/unthread right sleeve of front closure shirt/dress, Thread/unthread left sleeve of front closure shirt/dress, Pull shirt around back of front closure shirt/dress Upper body dressing/undressing: 4: Min-Patient completed 75 plus % of tasks FIM - Lower Body Dressing/Undressing Lower body dressing/undressing steps patient completed: Thread/unthread right underwear leg, Thread/unthread left underwear leg, Thread/unthread left pants leg, Don/Doff right sock, Don/Doff left sock, Don/Doff right shoe, Pull underwear up/down, Thread/unthread right pants leg, Pull pants up/down, Don/Doff left shoe Lower body dressing/undressing: 4: Min-Patient completed 75 plus % of tasks  FIM - Toileting Toileting steps completed by patient: Adjust clothing prior to toileting, Performs perineal hygiene, Adjust clothing after toileting Toileting Assistive Devices: Grab bar or rail for support Toileting: 5: Supervision: Safety issues/verbal cues  FIM - Diplomatic Services operational officer Devices: Therapist, music Transfers: 5-To toilet/BSC: Supervision (verbal cues/safety issues), 5-From toilet/BSC: Supervision (verbal cues/safety issues)  FIM - Banker Devices: Bed rails Bed/Chair Transfer: 4: Supine > Sit: Min A (steadying Pt. > 75%/lift 1 leg), 4: Bed > Chair or W/C: Min A (steadying Pt. > 75%)  FIM - Locomotion: Wheelchair Distance: 20 Locomotion: Wheelchair: 0: Activity did not occur FIM - Locomotion: Ambulation Locomotion: Ambulation Assistive Devices: Other (comment) (L HHA) Ambulation/Gait Assistance: 4: Min guard, 5: Supervision Locomotion: Ambulation: 4: Travels 150 ft or more with minimal assistance  (Pt.>75%)  Comprehension Comprehension Mode: Auditory Comprehension: 4-Understands basic 75 - 89% of the time/requires cueing 10 - 24% of the time  Expression Expression Mode: Verbal Expression: 4-Expresses basic 75 - 89% of the time/requires cueing 10 - 24% of the time. Needs helper to occlude trach/needs to repeat words.  Social Interaction Social Interaction: 5-Interacts appropriately 90% of the time - Needs monitoring or encouragement for participation or interaction.  Problem Solving Problem Solving: 4-Solves basic 75 - 89% of the time/requires cueing 10 - 24% of the time  Memory Memory: 4-Recognizes or recalls 75 - 89% of the time/requires cueing 10 - 24% of the time  Medical Problem List and Plan: 1. 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 DVT Prophylaxis/Anticoagulation: Pharmaceutical: Lovenox  3. Pain Management: tylenol, Kpad for periscapular myofascial pain 4. Mood: team to provide egosupport as possible 5. Neuropsych: This patient is not capable of making decisions on her own behalf. 6. Skin/Wound Care: encourage adequate nutrition, pressure relief,  7. Fluids/Electrolytes/Nutrition: renal diet 1200cc FR 8. DM type 2:uncontrolled, d/c hs lantus insulin , cont  SSI, had hypoglycemia on glucotrol none since d/c 9. ESRD: HD per renal service, dialysis to occur after therapy completed, TTS schedule 10. Dysphagia: tolerating regular consistency diet at present 11. HTN: bp controlled at present, volume mgt with HD 12. Anemia of chronic disease: aranesp  LOS (Days) 8 A FACE TO FACE EVALUATION WAS PERFORMED  Claudette Laws  E 02/09/2015, 7:33 AM

## 2015-02-10 ENCOUNTER — Inpatient Hospital Stay (HOSPITAL_COMMUNITY): Payer: Self-pay | Admitting: Occupational Therapy

## 2015-02-10 ENCOUNTER — Inpatient Hospital Stay (HOSPITAL_COMMUNITY): Payer: Self-pay | Admitting: Speech Pathology

## 2015-02-10 ENCOUNTER — Inpatient Hospital Stay (HOSPITAL_COMMUNITY): Payer: Self-pay | Admitting: Rehabilitation

## 2015-02-10 LAB — GLUCOSE, CAPILLARY
GLUCOSE-CAPILLARY: 150 mg/dL — AB (ref 70–99)
GLUCOSE-CAPILLARY: 203 mg/dL — AB (ref 70–99)
Glucose-Capillary: 163 mg/dL — ABNORMAL HIGH (ref 70–99)
Glucose-Capillary: 146 mg/dL — ABNORMAL HIGH (ref 70–99)
Glucose-Capillary: 128 mg/dL — ABNORMAL HIGH (ref 70–99)

## 2015-02-10 NOTE — Progress Notes (Signed)
Subjective:  Concerned about bruising at access site, no other complaints, anticipates discharge tomorrow  Objective: Vital signs in last 24 hours: Temp:  [98.6 F (37 C)-98.8 F (37.1 C)] 98.6 F (37 C) (04/13 0546) Pulse Rate:  [83-97] 89 (04/13 0546) Resp:  [14-22] 17 (04/13 0546) BP: (81-165)/(59-90) 112/78 mmHg (04/13 0546) SpO2:  [99 %-100 %] 99 % (04/13 0546) Weight:  [57.4 kg (126 lb 8.7 oz)-57.8 kg (127 lb 6.8 oz)] 57.4 kg (126 lb 8.7 oz) (04/13 0546) Weight change: 0.5 kg (1 lb 1.6 oz)  Intake/Output from previous day: 04/12 0701 - 04/13 0700 In: 360 [P.O.:360] Out: 2521 [Urine:600] Intake/Output this shift: Total I/O In: 240 [P.O.:240] Out: 50 [Urine:50]  Lab Results:  Recent Labs  02/09/15 1615  WBC 11.6*  HGB 8.2*  HCT 24.9*  PLT 618*   BMET:  Recent Labs  02/09/15 1615  NA 134*  K 5.5*  CL 99  CO2 22  GLUCOSE 129*  BUN 75*  CREATININE 9.60*  CALCIUM 9.3  ALBUMIN 3.4*   No results for input(s): PTH in the last 72 hours. Iron Studies: No results for input(s): IRON, TIBC, TRANSFERRIN, FERRITIN in the last 72 hours.  Studies/Results: No results found.   EXAM: General:  Alert, in no apparent distress Lungs:  CTA without rales, rhonchi, or wheezes Heart:  RRR without murmur or rub Abdomen:  + BS, soft and nontender Extremities:  No edema Dialysis Access:  AVF @ [facility] with + bruit, local bruising with hematoma  Dialysis Orders: [facility] 4 hr EDW 60.5 180 440/A 1.5 2K 2 Ca AVF no profile Mircera 50 q 4 weeks heparin 3500 calcitriol 0.25 - recently lowered from 0.75 due to ^ corr Ca iPTH 639 3/24 hgb 11.4 17%sat 3/24 - ferritin 1473 10/2014    Assessment/Plan: 1. Acute brainstem (pons) infarct - Hx chronic lacunar infarcts, now on statin for elevated cholesterol, TGs; rehab since 4/4. 2. ESRD - HD on TTS @ [facility], K 5.5 pre-HD yesterday.  Next HD tomorrow. 3. HTN/Volume - BP 112/78, no meds; wt 57.4 kg s/p net UF 1.9 L yesterday. 4. Anemia - Hgb  8.2, Aranesp 60 mcg on Tues. 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 6. Nutrition - Alb 3.4, diet Dys 3, vitamin. 7. DM - per primary. 8. Hypothyroidism - started Synthroid. 9. Disposition - to be discharged home.   LOS: 9 days   Natalie [name] 02/10/2015,9:12 AM

## 2015-02-10 NOTE — Progress Notes (Signed)
Occupational Therapy Discharge Summary  Patient Details  Name: Rudell Marlowe MRN: [id number] Date of Birth: 08/25/1971   Patient has met 10 of 11 long term goals due to improved activity tolerance, improved balance, ability to compensate for deficits and functional use of  RIGHT upper and RIGHT lower extremity.  Patient to discharge at overall Supervision level. Family observed prior sessions but were not present last day of therapy. OT discussed concerns with family member during session before she exited the room. Information sheet filled out by therapist regarding recommendations for safety follow discharge.  Reasons goals not met: Pt continues to require steady A when getting into and out of shower.  Recommendation:  OT recommends follow up services but secondary to lack of insurance coverage pt will not have follow up services. HEP provided.   Equipment: OT recommended shower chair. Family told without shower chair pt should not be in shower or tub secondary to increased fall risk.   Reasons for discharge: treatment goals met and discharge from hospital  Patient/family agrees with progress made and goals achieved: Yes  OT Discharge Precautions/Restrictions  Precautions Precautions: Fall Precaution Comments: R hemiparesis, HD Restrictions Weight Bearing Restrictions: No Vital Signs Therapy Vitals Temp: 98.5 F (36.9 C) Temp Source: Oral Pulse Rate: 86 Resp: 18 BP: (!) 150/69 mmHg Patient Position (if appropriate): Lying Oxygen Therapy SpO2: 97 % O2 Device: Not Delivered Pain Pain Assessment Pain Assessment: No/denies pain ADL ADL ADL Comments: see FIM Cognition Overall Cognitive Status: History of cognitive impairments - at baseline Arousal/Alertness: Awake/alert Orientation Level: Oriented X4 Attention: Sustained;Selective Focused Attention: Appears intact Sustained Attention: Appears intact Selective Attention: Impaired Selective Attention Impairment:  Functional complex Memory: Impaired Memory Impairment: Decreased recall of new information Awareness Impairment: Emergent impairment Behaviors: Impulsive Safety/Judgment: Appears intact Sensation Sensation Light Touch: Appears Intact Stereognosis: Not tested Hot/Cold: Appears Intact Proprioception: Impaired Detail Proprioception Impaired Details: Impaired RLE;Impaired RUE Coordination Gross Motor Movements are Fluid and Coordinated: No Fine Motor Movements are Fluid and Coordinated: No Coordination and Movement Description: Continues to demonstrate decreased gross and fine motor movement in RUE and RLE. HEP provided. Heel Shin Test: decreased fluidity Motor  Motor Motor: Abnormal postural alignment and control;Other (comment) Motor - Discharge Observations: mild R hemiparesis, decreased balance Mobility  Bed Mobility Bed Mobility: Supine to Sit;Sit to Supine Rolling Right: 5: Supervision Supine to Sit: 5: Supervision Supine to Sit Details: Verbal cues for technique;Verbal cues for sequencing;Tactile cues for initiation Sit to Supine: 5: Supervision Sit to Supine - Details: Verbal cues for technique;Verbal cues for sequencing Transfers Sit to Stand: 5: Supervision Sit to Stand Details: Verbal cues for sequencing;Verbal cues for technique;Verbal cues for precautions/safety Stand to Sit: 5: Supervision Stand to Sit Details (indicate cue type and reason): Verbal cues for sequencing;Verbal cues for technique;Verbal cues for precautions/safety  Trunk/Postural Assessment  Cervical Assessment Cervical Assessment: Within Functional Limits Thoracic Assessment Thoracic Assessment: Within Functional Limits Lumbar Assessment Lumbar Assessment: Within Functional Limits Postural Control Postural Control: Deficits on evaluation Postural Limitations: Pt has R UE/LE weakness causing LOB to the R, however pt now able to self correct  Balance Balance Balance Assessed: Yes Standardized  Balance Assessment Standardized Balance Assessment: Berg Balance Test Berg Balance Test Sit to Stand: Able to stand  independently using hands Standing Unsupported: Able to stand 2 minutes with supervision Sitting with Back Unsupported but Feet Supported on Floor or Stool: Able to sit safely and securely 2 minutes Stand to Sit: Sits safely with minimal use of hands  Transfers: Able to transfer safely, definite need of hands Standing Unsupported with Eyes Closed: Able to stand 10 seconds with supervision Standing Ubsupported with Feet Together: Able to place feet together independently and stand for 1 minute with supervision From Standing, Reach Forward with Outstretched Arm: Reaches forward but needs supervision From Standing Position, Pick up Object from Floor: Able to pick up shoe, needs supervision From Standing Position, Turn to Look Behind Over each Shoulder: Looks behind one side only/other side shows less weight shift Turn 360 Degrees: Needs close supervision or verbal cueing Standing Unsupported, Alternately Place Feet on Step/Stool: Needs assistance to keep from falling or unable to try Standing Unsupported, One Foot in Front: Able to plae foot ahead of the other independently and hold 30 seconds Standing on One Leg: Unable to try or needs assist to prevent fall Total Score: 34 Static Sitting Balance Static Sitting - Balance Support: Bilateral upper extremity supported Static Sitting - Level of Assistance: 5: Stand by assistance Dynamic Sitting Balance Dynamic Sitting - Balance Support: During functional activity;Right upper extremity supported Dynamic Sitting - Level of Assistance: 5: Stand by assistance Dynamic Sitting - Balance Activities: Lateral lean/weight shifting;Forward lean/weight shifting Static Standing Balance Static Standing - Balance Support: During functional activity Static Standing - Level of Assistance: 5: Stand by assistance Dynamic Standing Balance Dynamic  Standing - Balance Support: Right upper extremity supported;During functional activity Dynamic Standing - Level of Assistance: 5: Stand by assistance Dynamic Standing - Balance Activities: Forward lean/weight shifting;Lateral lean/weight shifting;Reaching for objects Extremity/Trunk Assessment RUE AROM (degrees) Overall AROM Right Upper Extremity: Within functional limits for tasks performed RUE Strength RUE Overall Strength: Deficits (3-/5 throughout) RUE Tone RUE Tone: Within Functional Limits LUE Assessment LUE Assessment: Within Functional Limits  See FIM for current functional status  Phineas Semen 02/10/2015, 4:49 PM

## 2015-02-10 NOTE — Progress Notes (Signed)
Speech Language Pathology Daily Session Note  Patient Details  Name: Natalie [name] MRN: [id number] Date of Birth: [birth year]  Today's Date: 02/10/2015 SLP Individual Time: 1100-1200 SLP Individual Time Calculation (min): 60 min  Short Term Goals: Week 1: SLP Short Term Goal 1 (Week 1): Pt will use increased vocal intensity and slow rate to achieve intelligibiltiy at the phrase/sentence level with min verbal and visual cues.   SLP Short Term Goal 1 - Progress (Week 1): Progressing toward goal SLP Short Term Goal 2 (Week 1): Pt will recall at least 2 dysarthria strategies with min verbal and visual cues over 2 consecutive sessions.  SLP Short Term Goal 2 - Progress (Week 1): Progressing toward goal SLP Short Term Goal 3 (Week 1): Pt will return demonstration of diaphragmatic breathing exercises to improve breath support for speech with min instructional cues over 2 consecutive sessions.  SLP Short Term Goal 3 - Progress (Week 1): Progressing toward goal SLP Short Term Goal 4 (Week 1): Pt will consume dys 3 textures and thin liquids with supervision cues for use of lingual sweep and liquid wash to clear right sided buccal residue over 3 consecutive sessions prior to diet advancement.     SLP Short Term Goal 4 - Progress (Week 1): Progressing toward goal  Skilled Therapeutic Interventions: Skilled ST intervention provided with focus on dysphagia and intelligibility goals. Pt's sister Natalie Bridge(Martha) and niece Natalie Locket(Myrna) present today. Pt was initially seated at EOB. Myrna assisted pt in transferring to chair to facilitate proper positioning for breath support and improved speech intelligibility.  Functional phrases were identified with assist of family, and SLP reviewed and practiced dysarthria strategies, including proper position, deep breath, increased loudness, and over-articulation. Mod-max verbal and visual cues were necessary initially, for pt to apply strategies to functional phrases. Pt was  observed drinking thin liquids via straw. No overt s/s aspiration observed. Education was completed with family re: swallow safety, need for supervision, and importance of small bites/sips. Niece verbalized understanding of education.  SLP encouraged family to continue cueing pt to breathe deeply prior to speech, increase loudness, and articulate multisyllabic word. Pt verbalized awareness of louder volume.    FIM:  Comprehension Comprehension Mode: Auditory Comprehension: 4-Understands basic 75 - 89% of the time/requires cueing 10 - 24% of the time Expression Expression Mode: Verbal Expression: 4-Expresses basic 75 - 89% of the time/requires cueing 10 - 24% of the time. Needs helper to occlude trach/needs to repeat words. Social Interaction Social Interaction: 5-Interacts appropriately 90% of the time - Needs monitoring or encouragement for participation or interaction. Problem Solving Problem Solving: 4-Solves basic 75 - 89% of the time/requires cueing 10 - 24% of the time Memory Memory: 4-Recognizes or recalls 75 - 89% of the time/requires cueing 10 - 24% of the time FIM - Eating Eating Activity: 5: Supervision/cues  Pain Pain Assessment Pain Assessment: 0-10 Pain Score: 8  Pain Type: Acute pain Pain Location: Arm Pain Orientation: Right  Therapy/Group: Individual Therapy   Celia B. PalcoBueche, W[facility]ia[facility] HospitalMSP, CCC-SLP [phone]  Leigh AuroraBueche, Celia Brown 02/10/2015, 12:45 PM

## 2015-02-10 NOTE — Patient Care Conference (Signed)
Inpatient RehabilitationTeam Conference and Plan of Care Update Date: 02/10/2015   Time: 11:45 AM    Patient Name: Natalie [name]      Medical Record Number: [id number]  Date of Birth: November 13, 1970 Sex: Female         Room/Bed: 4M11C/4M11C-01 Payor Info: Payor: MEDICAID POTENTIAL / Plan: MEDICAID POTENTIAL / Product Type: *No Product type* /    Admitting Diagnosis: ESRD  Admit Date/Time:  02/01/2015  4:46 PM Admission Comments: No comment available   Primary Diagnosis:  CVA (cerebral infarction) Principal Problem: CVA (cerebral infarction)  Patient Active Problem List   Diagnosis Date Noted  . Acute bronchitis 02/05/2015  . Weakness   . Life long cognitive dysfunction 01/24/2015  . Hypothyroidism 01/24/2015  . Hyperlipidemia 01/24/2015  . Anemia 01/24/2015  . Hyperkalemia 01/24/2015  . Leukocytosis 01/24/2015  . UTI (urinary tract infection) 01/24/2015  . Brainstem stroke 01/23/2015  . ESRD (end stage renal disease) 01/23/2015  . Diabetes 01/23/2015  . CVA (cerebral infarction) 01/23/2015    Expected Discharge Date: Expected Discharge Date: 02/11/15  Team Members Present: Physician leading conference: Dr. Alysia Penna Social Worker Present: Ovidio Kin, LCSW Nurse Present: Heather Roberts, RN PT Present: Cameron Sprang, PT OT Present: Benay Pillow, OT SLP Present: Windell Moulding, SLP PPS Coordinator present : Daiva Nakayama, RN, CRRN     Current Status/Progress Goal Weekly Team Focus  Medical   Diabetic management improving, now only requiring supervision for mobility  Home with family assistance, stable blood sugars  Discharge planning   Bowel/Bladder   Cont of bowel and bladder LBM 4/12, HD TTS  Pt to remain continent of bowel and bladder.   Assess q shift and PRN   Swallow/Nutrition/ Hydration   Dys 3 textures, thin liquids; intermittent supervision for use of swallowing precaution  mod I with least restrictive diet   completion of education prior to discharge     ADL's   supervision overall with steady assist for shower transfers  supervision overall   dynamic standing balance, R UE FMC, endurance, family/pt education   Mobility   pt currently min/guard to close S overall  S overall  RUE/LE NMR, coordination, balance, gait, stairs, pt/family education and training.    Communication   mild dysarthria due to right sided weakness   supervision for use of compensatory strategies   continue and practice carryover of strategies in phrases/sentences   Safety/Cognition/ Behavioral Observations  pt at baseline   supervision   d/c, goals met    Pain   Pain to back and L upper arm, muscle rub effective  <3  assess pain q shift and PRN   Skin   CDI  CDI  Monitor skin q shift and PRN      *See Care Plan and progress notes for long and short-term goals.  Barriers to Discharge: Language barrier    Possible Resolutions to Barriers:  Patient and family training with interpreter    Discharge Planning/Teaching Needs:  Home with sister and niece, niece still here from [place] and will assist short time for transitional period. Appt [facility] at [facility] for tomorrow      Team Discussion:  Meeting goals-supervision-family training not completed yet, niece to come back in this afternoon. BS better. Dys 3 thin. HD-T, Th, Sat. Pt ready to go home.  Revisions to Treatment Plan:  None   Continued Need for Acute Rehabilitation Level of Care: The patient requires daily medical management by a physician with specialized training  in physical medicine and rehabilitation for the following conditions: Daily direction of a multidisciplinary physical rehabilitation program to ensure safe treatment while eliciting the highest outcome that is of practical value to the patient.: Yes Daily medical management of patient stability for increased activity during participation in an intensive rehabilitation regime.: Yes Daily analysis of laboratory values and/or  radiology reports with any subsequent need for medication adjustment of medical intervention for : Neurological problems;Other  Leyland Kenna, Gardiner Rhyme 02/11/2015, 11:45 AM

## 2015-02-10 NOTE — Discharge Summary (Addendum)
Physician Discharge Summary  Patient ID: Natalie [name] MRN: [id number] DOB/AGE: Oct 29, 1971 44 y.o.  Admit date: 02/01/2015 Discharge date: 02/11/2015  Discharge Diagnoses:  Principal Problem:   CVA (cerebral infarction) Active Problems:   ESRD (end stage renal disease)   Diabetes   Anemia   Acute bronchitis   Discharged Condition: Stable.    Labs:  Basic Metabolic Panel:  Recent Labs Lab 02/05/15 0005 02/06/15 0611 02/09/15 1615 02/11/15 0355  NA 134* 133* 134* 135  K 4.6 4.7 5.5* 4.9  CL 94* 96 99 96  CO2 GLUCOSE 131* 63* 129* 107*  BUN 61* 37* 75* 52*  CREATININE 9.09* 6.97* 9.60* 8.07*  CALCIUM 9.4 9.5 9.3 9.1  PHOS 3.8 6.1* 4.2 5.9*    CBC:  Recent Labs Lab 02/06/15 0611 02/09/15 1615 02/11/15 0355  WBC 13.4* 11.6* 12.4*  HGB 8.7* 8.2* 8.0*  HCT 27.0* 24.9* 25.5*  MCV 98.2 94.7 96.6  PLT 652* 618* 607*    CBG:  Recent Labs Lab 02/10/15 1625 02/10/15 2113 02/11/15 0645 02/11/15 0939 02/11/15 1232  GLUCAP 128* 203* 104* 92 194*    Brief HPI:   Natalie [name] is a 44 y.o. RH-non english speaking female with history of hypertension,mild MR, ESRD-HD TTS who was admitted on 01/23/2015 with difficulty swallowing with drooling, dysarthria, R-sided weakness with increased difficulty walking. MRI of the brain showed acute brainstem infarct, left paracentral pons lacune with no mass effect as well as chronic lacunar infarcts of the right pons and left basal ganglia corona radiata.  TSH was elevated at 18.05 and was synthroid added for supplement. Swallow evaluation done and patient placed on dysphagia 3 diet with nectar liquids. Cognitive evaluation reveals patient at baseline. She was started on Plavix for left paracentral stroke due to small vessel disease. Patient has had ongoing issues with abdominal pain and CT abdomen/pelvis 4/1 revealed hepatic steatosis and bilateral renal atrophy--no gross abnormality. She has had reactive  leucocytosis with cough and fever due to bronchitis and was started on Levaquin for treatment.. Patient with resultant right sided weakness, foot drop affecting safety with mobility as well as impulsivity due to poor safety awareness. CIR was recommended by MD and Rehab team.     Hospital Course: Natalie [name] was admitted to rehab 02/01/2015 for inpatient therapies to consist of PT, ST and OT at least three hours five days a week. Past admission physiatrist, therapy team and rehab RN have worked together to provide customized collaborative inpatient rehab. She has tolerated plavix without side effects. Blood pressures have been monitored on tid basis and SBP has varied from 88- 160 with low BP past HD sessions. Respiratory status has been stable without complaints of SOB. She completed a week of antibiotic therapy for bronchitis on 04/09.  Diabetes has been monitored with ac/hs basis. Po intake has been good and elevated BS were treated with SSI only to prevent recurrent hypoglycemic episodes.  Patient and family were advised to maintain CM diet and check CBGs on bid to tid basis to monitor for trends at home and follow up with PMD for resumption of medication. HD has been  ongoing past therapy sessions on T,TH,Sa schedule and post dialysis weight is at  53 kg.   Patient's niece was educated extensively about need to adhere to current medication list as she and patient were unable to state or recognize medications used PTA.  Family was also educated on not needing any BP medications as  well as oral hypoglycemics at this time. [facility] with  Wellness center for follow up medical care. She has made good progress and requires close supervision with mobility due to poor attention, poor safety awareness and ongoing balance deficits.  She was unable to tolerate diet advancement to regular textures due to poorly fitting dentures. She will  continue to receive follow up HHPT, HHOT and HHRN by [facility] past discharge.   Rehab course: During patient's stay in rehab weekly team conferences were held to monitor patient's progress, set goals and discuss barriers to discharge. At admission, patient required min assist with mobility, moderate assist with self care tasks and moderate cues to utilize dysarthria strategies. She has had improvement in activity tolerance, balance, postural control, as well as ability to compensate for deficits. She is has had improvement in functional use RUE  and RLE as well as improved awareness. She is able to ambulate 150"X 2 with min/guard assist without AD. She requires close supervision for ADL tasks. She is tolerating dysphagia 3 diet without difficulty and is able to utilize dysarthria strategies with supervision. Cognition is at baseline. She has been educated on daily HEP for UE and family education was done regarding assistance needed as well as safety with all aspects of care.     Disposition:  Home  Diet: Renal diet. 1200 cc fluid/day.  Special Instructions: [facility]. [facility] as well as assistance with medications/glucometer.       Medication List    STOP taking these medications        albuterol (2.5 MG/3ML) 0.083% nebulizer solution  Commonly known as:  PROVENTIL     glipiZIDE 5 MG tablet  Commonly known as:  GLUCOTROL     insulin aspart 100 UNIT/ML injection  Commonly known as:  novoLOG     insulin glargine 100 UNIT/ML injection  Commonly known as:  LANTUS     levofloxacin 500 MG tablet  Commonly known as:  LEVAQUIN      TAKE these medications        atorvastatin 80 MG tablet  Commonly known as:  LIPITOR  Take 1 tablet (80 mg total) by mouth daily at 6 PM.     calcitRIOL 0.25 MCG capsule  Commonly known as:  ROCALTROL  Take 1 capsule (0.25 mcg total) by mouth every Monday, Wednesday, and Friday with hemodialysis.     calcium  carbonate (dosed in mg elemental calcium) 1250 (500 CA) MG/5ML  Take 5 mLs (500 mg of elemental calcium total) by mouth every 6 (six) hours as needed for indigestion.     clopidogrel 75 MG tablet  Commonly known as:  PLAVIX  Take 1 tablet (75 mg total) by mouth daily.     fenofibrate 160 MG tablet  Take 1 tablet (160 mg total) by mouth daily.     levothyroxine 25 MCG tablet  Commonly known as:  SYNTHROID, LEVOTHROID  Take 1 tablet (25 mcg total) by mouth daily before breakfast.     loratadine 10 MG tablet  Commonly known as:  CLARITIN  Take 1 tablet (10 mg total) by mouth daily.     multivitamin Tabs tablet  Take 1 tablet by mouth at bedtime.     MUSCLE RUB 10-15 % Crea  Apply 1 application topically 3 (three) times daily before meals.     senna-docusate 8.6-50 MG per tablet  Commonly  known as:  Senokot-S  Take 2 tablets by mouth 2 (two) times daily. For constipation     sucroferric oxyhydroxide 500 MG chewable tablet  Commonly known as:  VELPHORO  Chew 1 tablet (500 mg total) by mouth 3 (three) times daily with meals.       Follow-up Information    Follow up with Erick Colace, MD On 03/15/2015.   Specialty:  Physical Medicine and Rehabilitation   Why:  BE there at 2:00 for 2:30 appointment   Contact information:   [address] [place] [address] [phone]       Follow up with [facility]     On 02/12/2015.   Why:  APPT @ 11;15 AM   Contact information:   [address] [place] [address] [phone]      Follow up with Xu,Jindong, MD. Call today.   Specialty:  Neurology   Why:  for follow up appointment in 4 weeks   Contact information:   [address] [place] [address] [phone]       Signed: Jacquelynn Cree 02/11/2015, 1:50 PM

## 2015-02-10 NOTE — Progress Notes (Signed)
Speech Language Pathology Discharge Summary  Patient Details  Name: Natalie [name] MRN: [id number] Date of Birth: 1971/01/11    Patient has met 6 of 6 long term goals.  Patient to discharge at overall Supervision;Min level.  Reasons goals not met: n/a   Clinical Impression/Discharge Summary:  Pt made functional gains while inpatient and is discharging having met 6 out of 6 short term goals.  Pt is currently supervision assist for use of dysarthria compensatory strategies to achieve intelligibility in conversations.  Additionally, pt is consuming dys 3 textures and thin liquids with mod I use of swallowing precautions to minimize overt s/s of aspiration and manage buccal residue.   Pt progression to regular textures has been limited by poorly fitting dentures; however, pt will be ready for diet advancement with use of denture adhesive to more securely fix upper plate.  Pt is at baseline for cognition per family.  Pt and family education is complete.  No further ST needs are indicated at this time.    Care Partner:  Caregiver Able to Provide Assistance: Yes  Type of Caregiver Assistance: Physical;Cognitive  Recommendation:  None;24 hour supervision/assistance      Equipment: none recommended by SLP    Reasons for discharge: Discharged from hospital   Patient/Family Agrees with Progress Made and Goals Achieved: Yes   See FIM for current functional status  Emilio Math 02/10/2015, 5:07 PM

## 2015-02-10 NOTE — Progress Notes (Signed)
Physical Therapy Discharge Summary  Patient Details  Name: Natalie [name] MRN: [id number] Date of Birth: 17-Nov-1970  Today's Date: 02/10/2015 PT Individual Time: 1300-1415 PT Individual Time Calculation (min): 75 min    Patient has met 8 of 9 long term goals due to improved activity tolerance, improved balance, increased strength, increased range of motion, ability to compensate for deficits, functional use of  right upper extremity and right lower extremity and improved coordination.  Patient to discharge at an ambulatory level Supervision.   Patient's care partner is independent to provide the necessary physical and cognitive assistance at discharge.  Reasons goals not met: Pt did not meet community ambulation goal due to poor endurance and need for min A in community.  Recommendation:  Patient will benefit from ongoing skilled PT services in home health setting to continue to advance safe functional mobility, address ongoing impairments in decreased strength, decreased balance, decreased coordination in RUE/LE, and minimize fall risk.  Equipment: No equipment provided  Reasons for discharge: treatment goals met and discharge from hospital  Patient/family agrees with progress made and goals achieved: Yes   PT treatment/Intervention:  Pt received sitting in recliner in room, agreeable to therapy session.  Stating needing to use restroom prior to leaving room.  Ambulated to/from restroom and to sink to wash/dry hands at S level.  Also performed toileting and peri care at S level.  Ambulated to/from therapy gym >200' without AD in controlled and home at close S level with mod to max verbal cues for slower gait speed, increased attention to steps, and wider steps.  Continues to require very close S due to poor attention to task and poor balance.  Performed car transfer, furniture transfer and transfers throughout session at S level.  Bed mobility performed at mod I level.  Performed BERG  balance test with score of 34/56, indicative of 100% fall risk, therefore educated on need to have family with her with all mobility and hands on in community (also wrote this on D/C recommendations sheet).  Performed stairs 12, 6" steps with single handrail at min A level and max verbal cues for technique for safety.  Continue to highly recommend that family keep their hands on her with gait for safety and that she should not get up alone.  Also recommend that she use energy conservation techniques (explained via translator), use of RUE and safety at home.  Pt verbalized understanding.  Pt left in bed with all needs in reach.   PT Discharge Precautions/Restrictions Precautions Precautions: Fall Precaution Comments: R hemiparesis, HD Restrictions Weight Bearing Restrictions: No  Pain Pain Assessment Pain Assessment: 0-10 Pain Score: 8  Pain Type: Acute pain Pain Location: Arm Pain Orientation: Right Vision/Perception   See OT note.  Cognition Overall Cognitive Status: History of cognitive impairments - at baseline Arousal/Alertness: Awake/alert Orientation Level: Oriented X4 Attention: Sustained;Selective Focused Attention: Appears intact Sustained Attention: Appears intact Selective Attention: Impaired Selective Attention Impairment: Functional complex Memory: Impaired Memory Impairment: Decreased recall of new information Awareness Impairment: Emergent impairment Behaviors: Impulsive Safety/Judgment: Appears intact Comments: Pt continues to be somewhat impulsive despite education on not getting up on her own, aparently she has been ambulating around room.  Sensation Sensation Light Touch: Appears Intact Stereognosis: Not tested Hot/Cold: Not tested Proprioception: Impaired Detail Proprioception Impaired Details: Impaired RLE Coordination Gross Motor Movements are Fluid and Coordinated: No Fine Motor Movements are Fluid and Coordinated: No Coordination and Movement  Description: Continues to demonstrate decreased gross and fine motor movement  in RUE and RLE Heel Shin Test: decreased fluidity Motor  Motor Motor: Abnormal postural alignment and control;Other (comment) Motor - Discharge Observations: mild R hemiparesis, decreased balance  Mobility Bed Mobility Bed Mobility: Supine to Sit;Sit to Supine Supine to Sit: 5: Mod I  Sit to Supine: 5: Mod I  Transfers Transfers: Yes Sit to Stand: 5: Supervision Sit to Stand Details: Verbal cues for sequencing;Verbal cues for technique;Verbal cues for precautions/safety Stand to Sit: 5: Supervision Stand to Sit Details (indicate cue type and reason): Verbal cues for sequencing;Verbal cues for technique;Verbal cues for precautions/safety Stand Pivot Transfers: 5: Supervision Stand Pivot Transfer Details: Verbal cues for sequencing;Verbal cues for technique;Verbal cues for precautions/safety Locomotion  Ambulation Ambulation: Yes Ambulation/Gait Assistance: 5: Supervision Ambulation Distance (Feet): 200 Feet Assistive device: None Ambulation/Gait Assistance Details: Verbal cues for sequencing;Verbal cues for technique;Verbal cues for precautions/safety Gait Gait: Yes Gait Pattern: Impaired Gait Pattern: Step-through pattern;Decreased step length - right;Decreased step length - left;Trunk flexed;Narrow base of support;Decreased hip/knee flexion - left;Decreased hip/knee flexion - right Stairs / Additional Locomotion Stairs: Yes Stairs Assistance: 5: Supervision Stairs Assistance Details: Verbal cues for sequencing;Verbal cues for technique;Verbal cues for precautions/safety Stair Management Technique: One rail Right;Forwards;Step to pattern Number of Stairs: 12 Height of Stairs: 6 Wheelchair Mobility Wheelchair Mobility: No (pt ambulatory)  Trunk/Postural Assessment  Cervical Assessment Cervical Assessment: Within Functional Limits Thoracic Assessment Thoracic Assessment: Within Functional  Limits Lumbar Assessment Lumbar Assessment: Within Functional Limits Postural Control Postural Control: Deficits on evaluation Postural Limitations: Pt has R UE/LE weakness causing LOB to the R, however pt now able to self correct  Balance Balance Balance Assessed: Yes Standardized Balance Assessment Standardized Balance Assessment: Berg Balance Test Berg Balance Test Sit to Stand: Able to stand  independently using hands Standing Unsupported: Able to stand 2 minutes with supervision Sitting with Back Unsupported but Feet Supported on Floor or Stool: Able to sit safely and securely 2 minutes Stand to Sit: Sits safely with minimal use of hands Transfers: Able to transfer safely, definite need of hands Standing Unsupported with Eyes Closed: Able to stand 10 seconds with supervision Standing Ubsupported with Feet Together: Able to place feet together independently and stand for 1 minute with supervision From Standing, Reach Forward with Outstretched Arm: Reaches forward but needs supervision From Standing Position, Pick up Object from Floor: Able to pick up shoe, needs supervision From Standing Position, Turn to Look Behind Over each Shoulder: Looks behind one side only/other side shows less weight shift Turn 360 Degrees: Needs close supervision or verbal cueing Standing Unsupported, Alternately Place Feet on Step/Stool: Needs assistance to keep from falling or unable to try Standing Unsupported, One Foot in Front: Able to plae foot ahead of the other independently and hold 30 seconds Standing on One Leg: Unable to try or needs assist to prevent fall Total Score: 34 Static Sitting Balance Static Sitting - Balance Support: Bilateral upper extremity supported Static Sitting - Level of Assistance: 5: Stand by assistance Dynamic Sitting Balance Dynamic Sitting - Balance Support: During functional activity;Right upper extremity supported Dynamic Sitting - Level of Assistance: 5: Stand by  assistance Dynamic Sitting - Balance Activities: Lateral lean/weight shifting;Forward lean/weight shifting Static Standing Balance Static Standing - Balance Support: During functional activity Static Standing - Level of Assistance: 5: Stand by assistance Dynamic Standing Balance Dynamic Standing - Balance Support: Right upper extremity supported;During functional activity Dynamic Standing - Level of Assistance: 5: Stand by assistance Dynamic Standing - Balance Activities: Forward lean/weight shifting;Lateral lean/weight  shifting Extremity Assessment  RUE AROM (degrees) Overall AROM Right Upper Extremity: Within functional limits for tasks performed RUE Strength RUE Overall Strength: Deficits (3-/5 throughout) RUE Tone RUE Tone: Within Functional Limits LUE Assessment LUE Assessment: Within Functional Limits RLE Assessment RLE Assessment: Exceptions to [facility] RLE Strength Right Hip Flexion: 3+/5 Right Knee Flexion: 3+/5 Right Knee Extension: 4/5 Right Ankle Dorsiflexion: 3+/5 Right Ankle Plantar Flexion: 4/5 LLE Assessment LLE Assessment: Within Functional Limits  See FIM for current functional status  Denice Bors 02/10/2015, 7:47 PM

## 2015-02-10 NOTE — Progress Notes (Signed)
Social Work Patient ID: Karene FryIdalia Rubio [name], female   DOB: Dec 19, 1970, 44 y.o.   MRN: [id number] Spoke with niece-[name] per telephone to inform pt is being discharged tomorrow after dialysis.  She ha snot been back due to running errands and  Preparing for discharge tomorrow.  Family education was not completely finished.  PT recommends CGA walking beside pt at home.  Informed will have HD in am and then  By lunchtime be ready to go home.  Niece would really like her to get a wheelchair for community dialysis and she is not ambulating far for the community. Will order this.  Will see tomorrow when here.

## 2015-02-10 NOTE — Plan of Care (Signed)
Problem: RH Ambulation Goal: LTG Patient will ambulate in community environment (PT) LTG: Patient will ambulate in community environment, # of feet with assistance (PT).  Outcome: Not Applicable Date Met:  02/10/15 Did not assess community ambulation due to pts poor activity tolerance.  Recommend min A for community gait.      

## 2015-02-10 NOTE — Plan of Care (Signed)
Problem: RH Tub/Shower Transfers Goal: LTG Patient will perform tub/shower transfers w/assist (OT) LTG: Patient will perform tub/shower transfers with assist, with/without cues using equipment (OT)  Outcome: Not Met (add Reason) Pt continues to require steady assist for safety when getting into and out of shower.

## 2015-02-10 NOTE — Progress Notes (Signed)
Occupational Therapy Session Note  Patient Details  Name: Natalie [name] MRN: [id number] Date of Birth: [birth year]  Today's Date: 02/10/2015 OT Individual Time: [id number] OT Individual Time Calculation (min): 83 min    Short Term Goals: Week 2:  OT Short Term Goal 1 (Week 2): STGs=LTGs  Skilled Therapeutic Interventions/Progress Updates:   Upon entering the room, patient's niece present and encouraged to stay for session but stated she was unable. OT discussed patient's need for shower chair for safety if planning on taking a shower when returning home. Niece is unsure of equipment at this time but OT recommends sink bathing if shower chair not available for pt. Interpreter present as well during session. Pt with no c/o pain this session. Supine >sit from flat bed with no assist. Pt ambulating very slowly with close supervision to bathroom for toileting with supervision. Pt ambulating in room to obtain all clothing and items needed for bathing and dressing tasks from dresser. Pt did require steady assist when getting into and out of walk in shower for safety secondary to increased fall risk. Pt washed while seated in shower chair and holding onto hand held shower head with R hand during shower task. Pt returning to sit EOB for dressing tasks. Circle sitting in bed in order to don B socks and shoes this session. Once dressed pt stood at sink and held dentures with R hand as she cleaned them with L hand. Pt seated in recliner chair for rest break. OT discussed and educated [facility] on [facility] exercises for R hand in regards to every day tasks as well as exercises such as finger isolation and opposition. Interpreter wrote exercises recommended on handout in spanish for family as pt is not able to read. Pt with no other concerns or commends and therapist exited the room and interpreter remained present in the room upon exiting.   Therapy Documentation Precautions:  Precautions Precautions: Fall Precaution  Comments: HD  Restrictions Weight Bearing Restrictions: No ADL: ADL ADL Comments: see FIM  See FIM for current functional status  Therapy/Group: Individual Therapy  Lowella Gripittman, Antinio Sanderfer L 02/10/2015, 10:59 AM

## 2015-02-10 NOTE — Progress Notes (Signed)
44 y.o. [place] speaking female with history of hypertension,mild MR, ESRD-HD TTS who was admitted on 01/23/2015 with difficulty swallowing with drooling, dysarthria, R-sided weakness with increased difficulty walking. MRI of the brain showed acute brainstem infarct, left paracentral pons lacune with no mass effect as well as chronic lacunar infarcts of the right pons and left basal ganglia corona radiata. Carotid Dopplers without significant ICA stenosis. 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   Subjective/Complaints: No issues overnite, per CNA is supervision in room , no unsafe behaviours ROS-limited English  Objective: Vital Signs: Blood pressure 112/78, pulse 89, temperature 98.6 F (37 C), temperature source Oral, resp. rate 17, weight 57.4 kg (126 lb 8.7 oz), last menstrual period 12/23/2014, SpO2 99 %. No results found. Results for orders placed or performed during the hospital encounter of 02/01/15 (from the past 72 hour(s))  Glucose, capillary     Status: Abnormal   Collection Time: 02/07/15 12:03 PM  Result Value Ref Range   Glucose-Capillary 145 (H) 70 - 99 mg/dL   Comment 1 Notify RN   Glucose, capillary     Status: Abnormal   Collection Time: 02/07/15  4:38 PM  Result Value Ref Range   Glucose-Capillary 121 (H) 70 - 99 mg/dL   Comment 1 Notify RN   Glucose, capillary     Status: Abnormal   Collection Time: 02/07/15  8:47 PM  Result Value Ref Range   Glucose-Capillary 112 (H) 70 - 99 mg/dL  Glucose, capillary     Status: Abnormal   Collection Time: 02/08/15  6:36 AM  Result Value Ref Range   Glucose-Capillary 101 (H) 70 - 99 mg/dL  Glucose, capillary     Status: Abnormal   Collection Time: 02/08/15 11:37 AM  Result Value Ref Range   Glucose-Capillary 148 (H) 70 - 99 mg/dL  Glucose, capillary     Status: Abnormal   Collection Time: 02/08/15  4:27 PM  Result Value Ref  Range   Glucose-Capillary 158 (H) 70 - 99 mg/dL  Glucose, capillary     Status: Abnormal   Collection Time: 02/08/15  9:34 PM  Result Value Ref Range   Glucose-Capillary 220 (H) 70 - 99 mg/dL  Glucose, capillary     Status: Abnormal   Collection Time: 02/09/15  7:10 AM  Result Value Ref Range   Glucose-Capillary 104 (H) 70 - 99 mg/dL  Glucose, capillary     Status: Abnormal   Collection Time: 02/09/15 11:39 AM  Result Value Ref Range   Glucose-Capillary 113 (H) 70 - 99 mg/dL  CBC     Status: Abnormal   Collection Time: 02/09/15  4:15 PM  Result Value Ref Range   WBC 11.6 (H) 4.0 - 10.5 K/uL   RBC 2.63 (L) 3.87 - 5.11 MIL/uL   Hemoglobin 8.2 (L) 12.0 - 15.0 g/dL   HCT 24.9 (L) 36.0 - 46.0 %   MCV 94.7 78.0 - 100.0 fL   MCH 31.2 26.0 - 34.0 pg   MCHC 32.9 30.0 - 36.0 g/dL   RDW 16.6 (H) 11.5 - 15.5 %   Platelets 618 (H) 150 - 400 K/uL  Renal function panel     Status: Abnormal   Collection Time: 02/09/15  4:15 PM  Result Value Ref Range   Sodium 134 (L) 135 - 145 mmol/L   Potassium 5.5 (H) 3.5 - 5.1 mmol/L   Chloride 99 96 - 112 mmol/L  CO2 22 19 - 32 mmol/L   Glucose, Bld 129 (H) 70 - 99 mg/dL   BUN 75 (H) 6 - 23 mg/dL   Creatinine, Ser 9.60 (H) 0.50 - 1.10 mg/dL   Calcium 9.3 8.4 - 10.5 mg/dL   Phosphorus 4.2 2.3 - 4.6 mg/dL   Albumin 3.4 (L) 3.5 - 5.2 g/dL   GFR calc non Af Amer 4 (L) >90 mL/min   GFR calc Af Amer 5 (L) >90 mL/min    Comment: (NOTE) The eGFR has been calculated using the CKD EPI equation. This calculation has not been validated in all clinical situations. eGFR's persistently <90 mL/min signify possible Chronic Kidney Disease.    Anion gap 13 5 - 15  Glucose, capillary     Status: Abnormal   Collection Time: 02/10/15  7:00 AM  Result Value Ref Range   Glucose-Capillary 163 (H) 70 - 99 mg/dL     HEENT: normal Cardio: RRR Resp: CTA B/L and unlabored GI: BS positive, Tender and NT,ND Extremity:  Right AV fistula with good thrill Skin:    Bruising R upper arm Neuro: Alert, follows commands with gestural cues Normal Motor and Abnormal Motor 3- R delt bi tri, 3- grip, 3- HF, KE 3- ankle Musc/Skel: Mild tenderness in Left periscapular muscles, no swelling  Gen NAD   Assessment/Plan: 1. 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 Physiatrist is providing close team supervision and 24 hour management of active medical problems listed below. Physiatrist and rehab team continue to assess barriers to discharge/monitor patient progress toward functional and medical goals. Team conference today please see physician documentation under team conference tab, met with team face-to-face to discuss problems,progress, and goals. Formulized individual treatment plan based on medical history, underlying problem and comorbidities. FIM: FIM - Bathing Bathing Steps Patient Completed: Chest, Right Arm, Abdomen, Front perineal area, Right upper leg, Left upper leg, Left Arm, Right lower leg (including foot), Left lower leg (including foot), Buttocks Bathing: 5: Supervision: Safety issues/verbal cues  FIM - Upper Body Dressing/Undressing Upper body dressing/undressing steps patient completed: Thread/unthread left sleeve of pullover shirt/dress, Put head through opening of pull over shirt/dress, Pull shirt over trunk, Thread/unthread right sleeve of pullover shirt/dresss, Thread/unthread right sleeve of front closure shirt/dress, Thread/unthread left sleeve of front closure shirt/dress, Pull shirt around back of front closure shirt/dress, Button/unbutton shirt Upper body dressing/undressing: 5: Supervision: Safety issues/verbal cues FIM - Lower Body Dressing/Undressing Lower body dressing/undressing steps patient completed: Thread/unthread right underwear leg, Thread/unthread left underwear leg, Thread/unthread  left pants leg, Don/Doff right sock, Don/Doff right shoe, Pull underwear up/down, Thread/unthread right pants leg, Pull pants up/down, Don/Doff left shoe Lower body dressing/undressing: 4: Min-Patient completed 75 plus % of tasks  FIM - Toileting Toileting steps completed by patient: Adjust clothing prior to toileting, Performs perineal hygiene, Adjust clothing after toileting Toileting Assistive Devices: Grab bar or rail for support Toileting: 5: Supervision: Safety issues/verbal cues  FIM - Radio producer Devices: Grab bars Toilet Transfers: 5-To toilet/BSC: Supervision (verbal cues/safety issues), 5-From toilet/BSC: Supervision (verbal cues/safety issues)  FIM - Control and instrumentation engineer Devices: Bed rails Bed/Chair Transfer: 4: Bed > Chair or W/C: Min A (steadying Pt. > 75%)  FIM - Locomotion: Wheelchair Distance: 20 Locomotion: Wheelchair: 0: Activity did not occur FIM - Locomotion: Ambulation Locomotion: Ambulation Assistive Devices: Other (comment), Walker - Rolling Ambulation/Gait Assistance: 5: Supervision,  4: Min guard Locomotion: Ambulation: 4: Travels 150 ft or more with minimal assistance (Pt.>75%)  Comprehension Comprehension Mode: Auditory Comprehension: 4-Understands basic 75 - 89% of the time/requires cueing 10 - 24% of the time  Expression Expression Mode: Verbal Expression: 4-Expresses basic 75 - 89% of the time/requires cueing 10 - 24% of the time. Needs helper to occlude trach/needs to repeat words.  Social Interaction Social Interaction: 5-Interacts appropriately 90% of the time - Needs monitoring or encouragement for participation or interaction.  Problem Solving Problem Solving: 4-Solves basic 75 - 89% of the time/requires cueing 10 - 24% of the time  Memory Memory: 4-Recognizes or recalls 75 - 89% of the time/requires cueing 10 - 24% of the time  Medical Problem List and Plan: 1. Functional deficits  secondary to left paramedian pontine infarct -stroke prophylaxis with plavix 2. DVT Prophylaxis/Anticoagulation: Pharmaceutical: Lovenox  3. Pain Management: tylenol, Kpad for periscapular myofascial pain 4. Mood: team to provide egosupport as possible 5. Neuropsych: This patient is not capable of making decisions on her own behalf. 6. Skin/Wound Care: encourage adequate nutrition, pressure relief,  7. Fluids/Electrolytes/Nutrition: renal diet 1200cc FR 8. DM type 2:uncontrolled, d/c hs lantus insulin , cont  SSI, had hypoglycemia on glucotrol none since d/c 9. ESRD: HD per renal service, dialysis to occur after therapy completed, TTS schedule 10. Dysphagia: tolerating regular consistency diet at present 11. HTN: bp controlled at present, volume mgt with HD 12. Anemia of chronic disease: aranesp  LOS (Days) 9 A FACE TO FACE EVALUATION WAS PERFORMED  Mackenize Delgadillo E 02/10/2015, 7:38 AM

## 2015-02-11 LAB — CBC
HCT: 25.5 % — ABNORMAL LOW (ref 36.0–46.0)
HEMOGLOBIN: 8 g/dL — AB (ref 12.0–15.0)
MCH: 30.3 pg (ref 26.0–34.0)
MCHC: 31.4 g/dL (ref 30.0–36.0)
MCV: 96.6 fL (ref 78.0–100.0)
Platelets: 607 10*3/uL — ABNORMAL HIGH (ref 150–400)
RBC: 2.64 MIL/uL — ABNORMAL LOW (ref 3.87–5.11)
RDW: 16.6 % — ABNORMAL HIGH (ref 11.5–15.5)
WBC: 12.4 10*3/uL — ABNORMAL HIGH (ref 4.0–10.5)

## 2015-02-11 LAB — RENAL FUNCTION PANEL
ALBUMIN: 3.1 g/dL — AB (ref 3.5–5.2)
Anion gap: 15 (ref 5–15)
BUN: 52 mg/dL — AB (ref 6–23)
CALCIUM: 9.1 mg/dL (ref 8.4–10.5)
CO2: 24 mmol/L (ref 19–32)
CREATININE: 8.07 mg/dL — AB (ref 0.50–1.10)
Chloride: 96 mmol/L (ref 96–112)
GFR calc Af Amer: 6 mL/min — ABNORMAL LOW (ref >90)
GFR calc non Af Amer: 5 mL/min — ABNORMAL LOW (ref >90)
Glucose, Bld: 107 mg/dL — ABNORMAL HIGH (ref 70–99)
PHOSPHORUS: 5.9 mg/dL — AB (ref 2.3–4.6)
Potassium: 4.9 mmol/L (ref 3.5–5.1)
Sodium: 135 mmol/L (ref 135–145)

## 2015-02-11 LAB — GLUCOSE, CAPILLARY
Glucose-Capillary: 104 mg/dL — ABNORMAL HIGH (ref 70–99)
Glucose-Capillary: 92 mg/dL (ref 70–99)
Glucose-Capillary: 194 mg/dL — ABNORMAL HIGH (ref 70–99)

## 2015-02-11 MED ORDER — CALCITRIOL 0.25 MCG PO CAPS: 0.2500 ug | ORAL_CAPSULE | ORAL | Status: AC

## 2015-02-11 MED ORDER — ALTEPLASE 2 MG IJ SOLR
2.0000 mg | Freq: Once | INTRAMUSCULAR | Status: DC | PRN
  Filled 2015-02-11: qty 2

## 2015-02-11 MED ORDER — CALCIUM CARBONATE 1250 MG/5ML PO SUSP: 500.0000 mg | Freq: Four times a day (QID) | ORAL | Status: AC | PRN

## 2015-02-11 MED ORDER — RENA-VITE PO TABS: 1.0000 | ORAL_TABLET | Freq: Every day | ORAL | Status: AC

## 2015-02-11 MED ORDER — SENNOSIDES-DOCUSATE SODIUM 8.6-50 MG PO TABS: 2.0000 | ORAL_TABLET | Freq: Two times a day (BID) | ORAL | Status: DC

## 2015-02-11 MED ORDER — HEPARIN SODIUM (PORCINE) 1000 UNIT/ML DIALYSIS: 20.0000 [IU]/kg | INTRAMUSCULAR | Status: DC | PRN

## 2015-02-11 MED ORDER — LEVOTHYROXINE SODIUM 25 MCG PO TABS: 25.0000 ug | ORAL_TABLET | Freq: Every day | ORAL | Status: AC

## 2015-02-11 MED ORDER — ATORVASTATIN CALCIUM 80 MG PO TABS: 80.0000 mg | ORAL_TABLET | Freq: Every day | ORAL | Status: AC

## 2015-02-11 MED ORDER — CLOPIDOGREL BISULFATE 75 MG PO TABS: 75.0000 mg | ORAL_TABLET | Freq: Every day | ORAL | Status: AC

## 2015-02-11 MED ORDER — LORATADINE 10 MG PO TABS: 10.0000 mg | ORAL_TABLET | Freq: Every day | ORAL | Status: AC

## 2015-02-11 MED ORDER — PENTAFLUOROPROP-TETRAFLUOROETH EX AERO: 1.0000 "application " | INHALATION_SPRAY | CUTANEOUS | Status: DC | PRN

## 2015-02-11 MED ORDER — MUSCLE RUB 10-15 % EX CREA: 1.0000 "application " | TOPICAL_CREAM | Freq: Three times a day (TID) | CUTANEOUS | Status: DC

## 2015-02-11 MED ORDER — LIDOCAINE-PRILOCAINE 2.5-2.5 % EX CREA: 1.0000 "application " | TOPICAL_CREAM | CUTANEOUS | Status: DC | PRN

## 2015-02-11 MED ORDER — HEPARIN SODIUM (PORCINE) 1000 UNIT/ML DIALYSIS: 1000.0000 [IU] | INTRAMUSCULAR | Status: DC | PRN

## 2015-02-11 MED ORDER — LORATADINE 10 MG PO TABS: 10.0000 mg | ORAL_TABLET | Freq: Every day | ORAL | Status: DC

## 2015-02-11 MED ORDER — SODIUM CHLORIDE 0.9 % IV SOLN: 100.0000 mL | INTRAVENOUS | Status: DC | PRN

## 2015-02-11 MED ORDER — NEPRO/CARBSTEADY PO LIQD
237.0000 mL | ORAL | Status: DC | PRN
  Filled 2015-02-11: qty 237

## 2015-02-11 MED ORDER — SUCROFERRIC OXYHYDROXIDE 500 MG PO CHEW: 500.0000 mg | CHEWABLE_TABLET | Freq: Three times a day (TID) | ORAL | Status: AC

## 2015-02-11 MED ORDER — MUSCLE RUB 10-15 % EX CREA: 1.0000 "application " | TOPICAL_CREAM | Freq: Three times a day (TID) | CUTANEOUS | Status: AC

## 2015-02-11 MED ORDER — LIDOCAINE HCL (PF) 1 % IJ SOLN: 5.0000 mL | INTRAMUSCULAR | Status: DC | PRN

## 2015-02-11 MED ORDER — FENOFIBRATE 160 MG PO TABS: 160.0000 mg | ORAL_TABLET | Freq: Every day | ORAL | Status: AC

## 2015-02-11 MED ORDER — SENNOSIDES-DOCUSATE SODIUM 8.6-50 MG PO TABS: 2.0000 | ORAL_TABLET | Freq: Two times a day (BID) | ORAL | Status: AC

## 2015-02-11 MED ORDER — CALCITRIOL 0.25 MCG PO CAPS: 0.2500 ug | ORAL_CAPSULE | ORAL | Status: DC

## 2015-02-11 NOTE — Progress Notes (Signed)
Subjective/Complaints:  ROS-limited English  Objective: Vital Signs: Blood pressure 160/73, pulse 75, temperature 98 F (36.7 C), temperature source Oral, resp. rate 17, weight 56.1 kg (123 lb 10.9 oz), last menstrual period 12/23/2014, SpO2 95 %. No results found. Results for orders placed or performed during the hospital encounter of 02/01/15 (from the past 72 hour(s))  Glucose, capillary     Status: Abnormal   Collection Time: 02/08/15 11:37 AM  Result Value Ref Range   Glucose-Capillary 148 (H) 70 - 99 mg/dL  Glucose, capillary     Status: Abnormal   Collection Time: 02/08/15  4:27 PM  Result Value Ref Range   Glucose-Capillary 158 (H) 70 - 99 mg/dL  Glucose, capillary     Status: Abnormal   Collection Time: 02/08/15  9:34 PM  Result Value Ref Range   Glucose-Capillary 220 (H) 70 - 99 mg/dL  Glucose, capillary     Status: Abnormal   Collection Time: 02/09/15  7:10 AM  Result Value Ref Range   Glucose-Capillary 104 (H) 70 - 99 mg/dL  Glucose, capillary     Status: Abnormal   Collection Time: 02/09/15 11:39 AM  Result Value Ref Range   Glucose-Capillary 113 (H) 70 - 99 mg/dL  CBC     Status: Abnormal   Collection Time: 02/09/15  4:15 PM  Result Value Ref Range   WBC 11.6 (H) 4.0 - 10.5 K/uL   RBC 2.63 (L) 3.87 - 5.11 MIL/uL   Hemoglobin 8.2 (L) 12.0 - 15.0 g/dL   HCT 24.9 (L) 36.0 - 46.0 %   MCV 94.7 78.0 - 100.0 fL   MCH 31.2 26.0 - 34.0 pg   MCHC 32.9 30.0 - 36.0 g/dL   RDW 16.6 (H) 11.5 - 15.5 %   Platelets 618 (H) 150 - 400 K/uL  Renal function panel     Status: Abnormal   Collection Time: 02/09/15  4:15 PM  Result Value Ref Range   Sodium 134 (L) 135 - 145 mmol/L   Potassium 5.5 (H) 3.5 - 5.1 mmol/L   Chloride 99 96 - 112 mmol/L   CO2 22 19 - 32 mmol/L   Glucose, Bld 129 (H) 70 - 99 mg/dL   BUN 75 (H) 6 - 23 mg/dL   Creatinine, Ser 9.60 (H) 0.50 - 1.10 mg/dL   Calcium 9.3 8.4 - 10.5 mg/dL   Phosphorus 4.2 2.3 - 4.6 mg/dL   Albumin 3.4 (L) 3.5 - 5.2  g/dL   GFR calc non Af Amer 4 (L) >90 mL/min   GFR calc Af Amer 5 (L) >90 mL/min    Comment: (NOTE) The eGFR has been calculated using the CKD EPI equation. This calculation has not been validated in all clinical situations. eGFR's persistently <90 mL/min signify possible Chronic Kidney Disease.    Anion gap 13 5 - 15  Glucose, capillary     Status: Abnormal   Collection Time: 02/09/15  8:30 PM  Result Value Ref Range   Glucose-Capillary 150 (H) 70 - 99 mg/dL  Glucose, capillary     Status: Abnormal   Collection Time: 02/10/15  7:00 AM  Result Value Ref Range   Glucose-Capillary 163 (H) 70 - 99 mg/dL  Glucose, capillary     Status: Abnormal   Collection Time: 02/10/15 11:17 AM  Result Value Ref Range   Glucose-Capillary 146 (H) 70 - 99 mg/dL  Glucose, capillary     Status: Abnormal   Collection Time: 02/10/15  4:25 PM  Result Value Ref  Range   Glucose-Capillary 128 (H) 70 - 99 mg/dL  Glucose, capillary     Status: Abnormal   Collection Time: 02/10/15  9:13 PM  Result Value Ref Range   Glucose-Capillary 203 (H) 70 - 99 mg/dL  CBC     Status: Abnormal   Collection Time: 02/11/15  3:55 AM  Result Value Ref Range   WBC 12.4 (H) 4.0 - 10.5 K/uL   RBC 2.64 (L) 3.87 - 5.11 MIL/uL   Hemoglobin 8.0 (L) 12.0 - 15.0 g/dL   HCT 25.5 (L) 36.0 - 46.0 %   MCV 96.6 78.0 - 100.0 fL   MCH 30.3 26.0 - 34.0 pg   MCHC 31.4 30.0 - 36.0 g/dL   RDW 16.6 (H) 11.5 - 15.5 %   Platelets 607 (H) 150 - 400 K/uL  Renal function panel     Status: Abnormal   Collection Time: 02/11/15  3:55 AM  Result Value Ref Range   Sodium 135 135 - 145 mmol/L   Potassium 4.9 3.5 - 5.1 mmol/L   Chloride 96 96 - 112 mmol/L   CO2 24 19 - 32 mmol/L   Glucose, Bld 107 (H) 70 - 99 mg/dL   BUN 52 (H) 6 - 23 mg/dL   Creatinine, Ser 8.07 (H) 0.50 - 1.10 mg/dL   Calcium 9.1 8.4 - 10.5 mg/dL   Phosphorus 5.9 (H) 2.3 - 4.6 mg/dL   Albumin 3.1 (L) 3.5 - 5.2 g/dL   GFR calc non Af Amer 5 (L) >90 mL/min   GFR calc Af Amer  6 (L) >90 mL/min    Comment: (NOTE) The eGFR has been calculated using the CKD EPI equation. This calculation has not been validated in all clinical situations. eGFR's persistently <90 mL/min signify possible Chronic Kidney Disease.    Anion gap 15 5 - 15  Glucose, capillary     Status: Abnormal   Collection Time: 02/11/15  6:45 AM  Result Value Ref Range   Glucose-Capillary 104 (H) 70 - 99 mg/dL     Mood/affect approp Cardio: RRR Resp: CTA B/L and unlabored GI: BS positive, Tender and NT,ND  Neuro: Alert, follows commands with gestural cues Normal Motor and Abnormal Motor 4- R delt bi tri, 4- grip,  Gen NAD   Assessment/Plan: 1. 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  Stable for D/C today after HD F/u PCP in 1-2 weeks F/u PM&R 3 weeks See D/C summary See D/C instructions FIM - Bathing Bathing Steps Patient Completed: Chest, Right Arm, Abdomen, Front perineal area, Right upper leg, Left upper leg, Left Arm, Right lower leg (including foot), Left lower leg (including foot), Buttocks Bathing: 5: Supervision: Safety issues/verbal cues  FIM - Upper Body Dressing/Undressing Upper body dressing/undressing steps patient completed: Thread/unthread left sleeve of pullover shirt/dress, Put head through opening of pull over shirt/dress, Pull shirt over trunk, Thread/unthread right sleeve of pullover shirt/dresss, Thread/unthread right sleeve of front closure shirt/dress, Thread/unthread left sleeve of front closure shirt/dress, Pull shirt around back of front closure shirt/dress, Button/unbutton shirt Upper body dressing/undressing: 5: Supervision: Safety issues/verbal cues FIM - Lower Body Dressing/Undressing Lower body dressing/undressing steps patient completed: Thread/unthread right underwear leg, Thread/unthread left underwear leg, Thread/unthread left pants leg, Don/Doff right sock, Don/Doff right shoe, Pull underwear up/down,  Thread/unthread right pants leg, Pull pants up/down, Don/Doff left shoe, Don/Doff left sock Lower body dressing/undressing: 5: Supervision: Safety issues/verbal cues  FIM - Toileting Toileting steps completed by patient: Adjust clothing prior to toileting,  Performs perineal hygiene, Adjust clothing after toileting Toileting Assistive Devices: Grab bar or rail for support Toileting: 5: Supervision: Safety issues/verbal cues  FIM - Radio producer Devices: Grab bars Toilet Transfers: 5-To toilet/BSC: Supervision (verbal cues/safety issues), 5-From toilet/BSC: Supervision (verbal cues/safety issues)  FIM - Control and instrumentation engineer Devices: Arm rests Bed/Chair Transfer: 5: Supine > Sit: Supervision (verbal cues/safety issues), 5: Sit > Supine: Supervision (verbal cues/safety issues), 5: Bed > Chair or W/C: Supervision (verbal cues/safety issues), 5: Chair or W/C > Bed: Supervision (verbal cues/safety issues)  FIM - Locomotion: Wheelchair Distance:  (pt ambulatory) Locomotion: Wheelchair: 0: Activity did not occur FIM - Locomotion: Ambulation Locomotion: Ambulation Assistive Devices: Other (comment) (no AD) Ambulation/Gait Assistance: 5: Supervision Locomotion: Ambulation: 5: Travels 150 ft or more with supervision/safety issues  Comprehension Comprehension Mode: Auditory Comprehension: 4-Understands basic 75 - 89% of the time/requires cueing 10 - 24% of the time  Expression Expression Mode: Verbal Expression: 4-Expresses basic 75 - 89% of the time/requires cueing 10 - 24% of the time. Needs helper to occlude trach/needs to repeat words.  Social Interaction Social Interaction: 5-Interacts appropriately 90% of the time - Needs monitoring or encouragement for participation or interaction.  Problem Solving Problem Solving: 4-Solves basic 75 - 89% of the time/requires cueing 10 - 24% of the time  Memory Memory: 4-Recognizes or recalls  75 - 89% of the time/requires cueing 10 - 24% of the time  Medical Problem List and Plan: 1. 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 DVT Prophylaxis/Anticoagulation: Pharmaceutical: Lovenox  3. Pain Management: tylenol, Kpad for periscapular myofascial pain 4. Mood: team to provide egosupport as possible 5. Neuropsych: This patient is not capable of making decisions on her own behalf. 6. Skin/Wound Care: encourage adequate nutrition, pressure relief,  7. Fluids/Electrolytes/Nutrition: renal diet 1200cc FR 8. DM type 2:uncontrolled, d/c hs lantus insulin , cont  SSI, had hypoglycemia on glucotrol none since d/c 9. ESRD: HD per renal service, dialysis to occur after therapy completed, TTS schedule 10. Dysphagia: tolerating regular consistency diet at present 11. HTN: bp controlled at present, volume mgt with HD 12. Anemia of chronic disease: aranesp  LOS (Days) 10 A FACE TO FACE EVALUATION WAS PERFORMED  KIRSTEINS,ANDREW E 02/11/2015, 7:10 AM

## 2015-02-11 NOTE — Progress Notes (Signed)
Social Work Discharge Note Discharge Note  The overall goal for the admission was met for:   Discharge location: [place] LEVEL  Length of Stay: Yes-10 DAYS  Discharge activity level: Yes-SUPERVISION/LIGHT MIN  Home/community participation: Yes  Services provided included: MD, RD, PT, OT, SLP, RN, CM, Pharmacy and [place]: Other: EMERGENCY MEDICAID ALL SHE IS ELIGIBLE FOR  Follow-up services arranged: Home Health: [facility]-PT,OT,RN, DME: [place] and Patient/Family has no preference for HH/DME agencies  Comments (or additional information):FAMILY EDUCATION PARTIALLY DONE, NIECE FEELS SHE CAN SHOW HER MOM THE CARE PT REQUIRES. WILL FOLLOW UP [facility] ON Friday 4/15 APPOINTMENT AT 11;15. INFORMATION GIVEN TO NIECE TO FOLLOW UP WITH. MATCH PROGRAM GIVEN TO NIECE FOR ASSISTANCE WITH MEDICATIONS  Patient/Family verbalized understanding of follow-up arrangements: Yes  Individual responsible for coordination of the follow-up plan: [name]-NIECE  Confirmed correct DME delivered: Elease Hashimoto 02/11/2015    Elease Hashimoto

## 2015-02-11 NOTE — Discharge Instructions (Addendum)
Inpatient Rehab Discharge Instructions  Natalie [name] Discharge date and time: 02/11/15   Activities/Precautions/ Functional Status: Activity: activity as tolerated Diet: renal diet 1200 cc fluid per day (5 cups of fluid) Wound Care: none needed   Functional status:  ___ No restrictions     ___ Walk up steps independently _X__ 24/7 supervision/assistance   ___ Walk up steps with assistance ___ Intermittent supervision/assistance  ___ Bathe/dress independently ___ Walk with walker     _X__ Bathe/dress with assistance ___ Walk Independently    ___ Shower independently ___ Walk with assistance    ___ Shower with assistance _X__ No alcohol     ___ Return to work/school ________  Special Instructions: 1. Check blood sugars at bedtime and in am daily. Eat a small snack at bedtime if blood sugar is below 150.    COMMUNITY REFERRALS UPON DISCHARGE:    Home Health:   PT & RN  Agency:[facility] Phone:[phone] Date of last service:02/11/2015  Medical Equipment/Items Ordered:WHEELCHAIR & TUB SEAT  Agency/Supplier:[facility]   [phone] [facility] FOR ASSISTANCE WITH [facility] AND Joan Flores- [phone] APPOINTMENT February 12, 2015 11;15 AM   STROKE/TIA DISCHARGE INSTRUCTIONS SMOKING Cigarette smoking nearly doubles your risk of having a stroke & is the single most alterable risk factor  If you smoke or have smoked in the last 12 months, you are advised to quit smoking for your health.  Most of the excess cardiovascular risk related to smoking disappears within a year of stopping.  Ask you doctor about anti-smoking medications  [place] Quit Line: 1-800-QUIT NOW  Free Smoking Cessation Classes [phone]  CHOLESTEROL Know your levels; limit fat & cholesterol in your diet  Lipid Panel     Component Value Date/Time   CHOL 305* 01/24/2015 0445   TRIG 526* 01/24/2015 0445   HDL 35* 01/24/2015 0445   CHOLHDL 8.7 01/24/2015 0445   VLDL UNABLE TO  CALCULATE IF TRIGLYCERIDE OVER 400 mg/dL 19/14/7829 5621   LDLCALC UNABLE TO CALCULATE IF TRIGLYCERIDE OVER 400 mg/dL 30/86/5784 6962      Many patients benefit from treatment even if their cholesterol is at goal.  Goal: Total Cholesterol (CHOL) less than 160  Goal:  Triglycerides (TRIG) less than 150  Goal:  HDL greater than 40  Goal:  LDL (LDLCALC) less than 100   BLOOD PRESSURE American Stroke Association blood pressure target is less that 120/80 mm/Hg  Your discharge blood pressure is:  BP: 112/78 mmHg  Monitor your blood pressure  Limit your salt and alcohol intake  Many individuals will require more than one medication for high blood pressure  DIABETES (A1c is a blood sugar average for last 3 months) Goal HGBA1c is under 7% (HBGA1c is blood sugar average for last 3 months)  Diabetes:     Lab Results  Component Value Date   HGBA1C 7.8* 01/24/2015     Your HGBA1c can be lowered with medications, healthy diet, and exercise.  Check your blood sugar as directed by your physician  Call your physician if you experience unexplained or low blood sugars.  PHYSICAL ACTIVITY/REHABILITATION Goal is 30 minutes at least 4 days per week  Activity: No restrictions. Therapies: See above Return to work: N/A  Activity decreases your risk of heart attack and stroke and makes your heart stronger.  It helps control your weight and blood pressure; helps you relax and can improve your mood.  Participate in a regular exercise program.  Talk with your doctor about the  best form of exercise for you (dancing, walking, swimming, cycling).  DIET/WEIGHT Goal is to maintain a healthy weight  Your discharge diet is: DIET DYS 3 Fluid consistency:: Thin; Fluid restriction:: 1200 mL Your height is:  5'1" Your current weight is: Weight: 57.4 kg (126 lb 8.7 oz) Your Body Mass Index (BMI) is:  25.3  Following the type of diet specifically designed for you will help prevent another stroke.  You are  at goal.    Your goal Body Mass Index (BMI) is 19-24.  Healthy food habits can help reduce 3 risk factors for stroke:  High cholesterol, hypertension, and excess weight.  RESOURCES Stroke/Support Group:  Call [phone]   STROKE EDUCATION PROVIDED/REVIEWED AND GIVEN TO PATIENT Stroke warning signs and symptoms How to activate emergency medical system (call 911). Medications prescribed at discharge. Need for follow-up after discharge. Personal risk factors for stroke. Pneumonia vaccine given:  Flu vaccine given:  My questions have been answered, the writing is legible, and I understand these instructions.  I will adhere to these goals & educational materials that have been provided to me after my discharge from the hospital.       My questions have been answered and I understand these instructions. I will adhere to these goals and the provided educational materials after my discharge from the hospital.  Patient/Caregiver Signature _______________________________ Date __________  Clinician Signature _______________________________________ Date __________  Please bring this form and your medication list with you to all your follow-up doctor's appointments.

## 2015-02-11 NOTE — Procedures (Signed)
I have seen and examined this patient and agree with the plan of care  No issues  Fistula working well right arm no emerging dialysis related problems Natalie [name] W 02/11/2015, 8:28 AM

## 2015-02-11 NOTE — Progress Notes (Signed)
Pt's family got the diet handout in spanish (Renal,diabetic,heart healthy)they also got d/c instructions and follow up appointment.pt. Is ready to go home with family.

## 2015-02-12 ENCOUNTER — Encounter: Payer: Self-pay | Admitting: Family Medicine

## 2015-02-12 ENCOUNTER — Ambulatory Visit: Payer: Medicaid Other | Attending: Family Medicine | Admitting: Family Medicine

## 2015-02-12 VITALS — BP 161/92 | HR 74 | Temp 98.1°F | Resp 16 | Ht <= 58 in | Wt 130.8 lb

## 2015-02-12 DIAGNOSIS — I69851 Hemiplegia and hemiparesis following other cerebrovascular disease affecting right dominant side: Secondary | ICD-10-CM

## 2015-02-12 DIAGNOSIS — E119 Type 2 diabetes mellitus without complications: Secondary | ICD-10-CM | POA: Diagnosis not present

## 2015-02-12 DIAGNOSIS — N186 End stage renal disease: Secondary | ICD-10-CM

## 2015-02-12 DIAGNOSIS — E039 Hypothyroidism, unspecified: Secondary | ICD-10-CM

## 2015-02-12 DIAGNOSIS — E785 Hyperlipidemia, unspecified: Secondary | ICD-10-CM

## 2015-02-12 DIAGNOSIS — I639 Cerebral infarction, unspecified: Secondary | ICD-10-CM | POA: Diagnosis present

## 2015-02-12 DIAGNOSIS — E0821 Diabetes mellitus due to underlying condition with diabetic nephropathy: Secondary | ICD-10-CM

## 2015-02-12 DIAGNOSIS — I10 Essential (primary) hypertension: Secondary | ICD-10-CM | POA: Diagnosis not present

## 2015-02-12 DIAGNOSIS — I69351 Hemiplegia and hemiparesis following cerebral infarction affecting right dominant side: Secondary | ICD-10-CM

## 2015-02-12 LAB — GLUCOSE, POCT (MANUAL RESULT ENTRY): POC Glucose: 180 mg/dl — AB (ref 70–99)

## 2015-02-12 NOTE — Progress Notes (Signed)
Subjective:    Patient ID: Natalie FryIdalia Rubio [name], female    DOB: 31-Dec-1970, 44 y.o.   MRN: [id number]  HPI  Patient is seen with the aid of a Spanish interpreter- Natalie [name] and is here for a follow up on [facility] 01/[facility]/15 to 02/11/15 with difficulty swallowing with drooling, dysarthria, R-sided weakness with increased difficulty walking. MRI of the brain showed acute brainstem infarct, left paracentral pons lacune with no mass effect as well as chronic lacunar infarcts of the right pons and left basal ganglia corona radiata. TSH was elevated at 18.05 and was synthroid added for supplement. Swallow evaluation done and patient placed on dysphagia 3 diet with nectar liquids. Cognitive evaluation reveals patient at baseline. She was started on Plavix for left paracentral stroke due to small vessel disease She has had reactive leucocytosis with cough and fever due to bronchitis and was started on Levaquin for treatment..  Patient had residual right sided weakness, foot drop affecting safety with mobility as well as impulsivity due to poor safety awareness and underwent inpatient rehab.  Interval history: Family states that her speech has somewhat improved but she still has a problem with her mobility and is yet to begin therapy at home. We also confused about some of her medications which had been stopped during hospitalization. She has an upcoming appointment with neurology on 03/18/15  Past Medical History  Diagnosis Date  . Hypertension   . Diabetes mellitus without complication   . Hyperlipidemia 01/24/2015  . ESRD on hemodialysis started 02/25/2013    [facility] -   . Anemia of chronic disease   . Secondary hyperparathyroidism     Past Surgical History  Procedure Laterality Date  . Insertion of dialysis catheter      Past Surgical History  Procedure Laterality Date  . Insertion of dialysis catheter        Review of Systems  General: negative  for fever, weight loss, appetite change Eyes: no visual symptoms. ENT: no ear symptoms, no sinus tenderness, no nasal congestion or sore throat. Neck: no pain  Respiratory: no wheezing, shortness of breath, cough Cardiovascular: no chest pain, no dyspnea on exertion, no pedal edema, no orthopnea. Gastrointestinal: no abdominal pain, no diarrhea, no constipation Genito-Urinary: no urinary frequency, no dysuria, no polyuria. Hematologic: no bruising Endocrine: no cold or heat intolerance Neurological: see HPI Musculoskeletal: no joint pains, no joint swelling Skin: no pruritus, no rash. Psychological: no depression, no anxiety,       Objective: Filed Vitals:   02/12/15 1141  BP: 161/92  Pulse: 74  Temp: 98.1 F (36.7 C)  Resp: 16      Physical Exam  Constitutional: normal appearing,  Eyes: PERRLA HENT: Head is atraumatic, normal sinuses, normal oropharynx, normal appearing tonsils and palate Neck: normal range of motion, no thyromegaly, no JVD cardiovascular: normal rate and rhythm, normal heart sounds, no murmurs, rub or gallop, no pedal edema Respiratory: clear to auscultation bilaterally, no wheezes, no rales, no rhonchi Abdomen: soft, not tender to palpation, normal bowel sounds, no enlarged organs Extremities: Full ROM, no tenderness in joints Skin: warm and dry, no lesions. Neurological: alert, able to follow part of the discussion but exhibits some signs of mental retardation; motor strength 4/5 in left upper  Psychological: normal mood.  Lab Results  Component Value Date   HGBA1C 7.8* 01/24/2015     EXAM: MRI HEAD WITHOUT CONTRAST  TECHNIQUE: Multiplanar, multiecho pulse sequences of the brain and surrounding  structures were obtained without intravenous contrast.  COMPARISON: Head CT without contrast 1206 hours today.  FINDINGS: Restricted diffusion in a patchy and linear distribution in the left paracentral pons (series 4, image 15 and series 8,  image 16). Nearby small right paracentral chronic pontine lacunar infarct (series 5, image 10) with hemosiderin. No hemorrhage associated with the acute lacune. T2 and FLAIR hyperintensity with no significant mass effect.  No other restricted diffusion. Major intracranial vascular flow voids are within normal limits.  Left corona radiata and lentiform nuclei a chronic lacunar infarct(s). No cortical encephalomalacia identified. No other chronic blood products identified. No midline shift, mass effect, evidence of mass lesion, ventriculomegaly, extra-axial collection or acute intracranial hemorrhage. Cervicomedullary junction and pituitary are within normal limits.  Normal bone marrow signal. Negative visible cervical spine. Visible internal auditory structures appear normal. Visualized paranasal sinuses and mastoids are clear. Visualized orbit soft tissues are within normal limits. Visualized scalp soft tissues are within normal limits.  IMPRESSION: 1. Acute brainstem infarct. Left paracentral pons lacune with no mass effect or hemorrhage. 2. 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 Chronic microhemorrhage associated with the former.   Electronically Signed  By: Odessa Fleming M.D.  On: 01/23/2015 17:07      Assessment & Plan:  44 year-old female patient with a history of end-stage renal disease on dialysis with recent stroke and right hemiparesis.  Brainstem stroke with right hemiparesis: Continue Plavix as prescribed and keep appointment with neurology. Patient will be commencing outpatient physical therapy soon and will need to be assessed for a mobility assistive device she is high risk for falls.  Type II Diabetes mellitus: A1c of 7.8; CBG 180  I would not encourage stricter glycemic control as she is high risk. Advised to resume glipizide and I would hold off on insulin for now as her end-stage renal disease puts her at risk  for hypoglycemia.  Hypothyroidism: Stable  Essential hypertension: I have advised her to hold off on her lisinopril until she is reassessed by nephrology and clear to restart it as she is likely to have periods of hypotension postdialysis; but she is to continue her amlodipine.  End-stage renal disease: Managed by [facility]; continue hemodialysis as per protocol on Tuesday Thursdays and Saturdays.

## 2015-02-12 NOTE — Progress Notes (Signed)
Patient here for follow up stroke. Adm 3/26-4/14.

## 2015-02-12 NOTE — Patient Instructions (Signed)
Ictus isqumico (Ischemic Stroke) Un ictus (accidente cerebrovascular) es la muerte sbita del tejido cerebral. Esto es una emergencia mdica. Un ictus puede causar una prdida permanente de la actividad cerebral. Esto puede causar problemas con diferentes partes del cuerpo. Un ataque isqumico transitorio (AIT) es diferente porque no causa un dao permanente. Un AIT es un problema, de corta duracin, de flujo sanguneo deficiente, que afecta a una parte del cerebro. Un AIT tambin es un problema serio porque aumenta considerablemente las probabilidades de sufrir un ictus. Cuando los sntomas se presentan por primera vez, es imposible saber si se trata de un ictus o un AIT. CAUSAS  Un ictus se produce debido a una disminucin del suministro de oxgeno a una zona del cerebro. Generalmente, es el resultado de un pequeo cogulo sanguneo o acumulacin de colesterol o grasa (placa) que bloquea el flujo sanguneo en el cerebro. Un ictus tambin puede producirse por una obstruccin o dao en las arterias cartidas.  FACTORES DE RIESGO  Presin arterial elevada (hipertensin).  Colesterol elevado.  Diabetes mellitus.  Enfermedades cardacas.  La acumulacin de placa en los vasos sanguneos (enfermedad arterial perifrica o ateroesclerosis).  La acumulacin de placa en los vasos sanguneos que proveen sangre y oxgeno al cerebro (estenosis de la arteria cartida).  Ritmo cardaco anormal (fibrilacin auricular).  La obesidad.  Fumar.  Consumir anticonceptivos orales, especialmente en combinacin con el hbito de fumar.  Falta de actividad fsica.  Dieta rica en grasas, sal (sodio) y muchas caloras.  Consumo de alcohol.  Consumo de drogas ilegales (especialmente cocana y metanfetamina).  Ser de raza afroamericana.  Tener ms de 55aos.  Antecedentes familiares de ictus.  Historia previa de cogulos sanguneos, ictus, ataque isqumico transitorio o infarto.  Anemia  drepanoctica. SNTOMAS  Estos sntomas por lo general aparecen en forma repentina, o podran aparecer al despertar:  Debilidad o adormecimiento sbito de la cara, el brazo o la pierna, especialmente en un lado del cuerpo.  Dificultad repentina para caminar o para mover los brazos o las piernas.  Confusin sbita.  Cambios de personalidad sbitos.  Dificultad para hablar (afasia) o comprender.  Dificultad para tragar.  Dificultad sbita en la visin de uno o ambos ojos.  Visin doble.  Mareos.  Prdida del equilibrio o de la coordinacin.  Dolor de cabeza repentino sin causa aparente.  Dificultad para leer o escribir. DIAGNSTICO  El mdico podr determinar la presencia o ausencia de un ictus en funcin de los sntomas, antecedentes y del examen fsico. Generalmente, se realiza una tomografa computarizada (TC) del cerebro para confirmar el ictus, determinar las causas y la gravedad. Se pueden realizar otros estudios para hallar la causa del ictus. Estos estudios pueden incluir:  Electrocardiograma.  Monitorizacin electrocardiogrfica continua:  Ecocardiograma.  Ecografa de la cartida.  Imgenes por resonancia magntica (IRM).  Estudio de la circulacin cerebral.  Anlisis de sangre. PREVENCIN  El riesgo de sufrir un ictus puede disminuir al tratar, de manera apropiada, la hipertensin arterial, el colesterol alto, la diabetes, la enfermedad cardaca y la obesidad, y al dejar de fumar, limitar el consumo de alcohol y mantenerse fsicamente activo. TRATAMIENTO  El tiempo es fundamental. Es importante que busque tratamiento ante el primer signo de estos sntomas porque podr recibir un medicamento para disolver el cogulo (tromboltico), que no puede administrarse si ha pasado demasiado tiempo desde el comienzo de los sntomas. Aunque no sepa cundo comenzaron los sntomas, obtenga tratamiento lo antes posible, dado que hay otras opciones de tratamiento disponibles,  que incluyen   oxgeno, lquidos intravenosos (IV) y anticoagulantes. El tratamiento del ictus depende de la duracin, la gravedad y la causa de los sntomas. Pueden usarse medicamentos e incluirse cambios en la dieta para tratar la diabetes, la hipertensin arterial y otros factores de riesgo. Los fisioterapeutas y terapeutas del habla y ocupacionales lo evaluarn y trabajarn con usted para mejorar las funciones deterioradas por el ictus. Se tomarn medidas para prevenir complicaciones a corto y largo plazo, que incluyen infecciones por inhalar material extrao en los pulmones (neumona por aspiracin), cogulos sanguneos en las piernas, escaras y cadas. Pocas veces se requiere una ciruga para extraer grandes cogulos de sangre o abrir arterias bloqueadas. INSTRUCCIONES PARA EL CUIDADO EN EL HOGAR   Tome los medicamentos solamente como se lo haya indicado el mdico. Siga cuidadosamente las indicaciones. Podrn administrarle medicamentos para controlar los factores de riesgo del ictus. Asegrese de comprender todas las indicaciones en relacin a la administracin de los medicamentos.  Tal vez le indiquen que tome un medicamento para licuar la sangre, como la aspirina o el anticoagulante warfarina. Es necesario que reciba la warfarina exactamente segn las indicaciones.  Las dosis altas y bajas de warfarina son peligrosas. El exceso de warfarina aumenta el riesgo de sangrado. Dosis demasiado bajas de warfarina aumentan el riesgo de formacin de cogulos. Durante el tratamiento con warfarina, es necesario que se realicen peridicamente anlisis de sangre que miden el tiempo de coagulacin. Los anlisis de sangre incluirn el TP y el INR. Los resultados del TP y del INR permiten al mdico ajustar la dosis de warfarina. La dosis puede cambiarse por varias razones. Es de suma importancia que tome la warfarina exactamente como se le prescribi, y que tenga los niveles de TP y de INR exactamente como se le  indic.  Muchos de los alimentos, especialmente los que contienen vitamina K pueden interferir con la warfarina y afectar los resultados del TP y del INR. Los alimentos con alto contenido de vitamina K incluyen espinaca, col rizada, brcoli, repollo, acelga y nabos verdes, repollitos de [place], guisantes, coliflor, algas, perejil, hgado de cerdo y de vaca, t verde y aceite de soja. Debe consumir siempre una buena cantidad de alimentos ricos en vitamina K. Evite los cambios importantes en su dieta, o informe a su mdico antes de cambiar su dieta. Solicite una cita con un nutricionista para hacerle las preguntas que le surjan.  Muchos medicamentos interfieren con la warfarina y afectan los resultados del TP y del INR. Debe informarle a su mdico acerca de todos los medicamentos que tome. Esto incluye todas las vitaminas y suplementos. Tenga especial cuidado con la aspirina y los medicamentos antiinflamatorios. No tome ni suspenda ningn medicamento tanto recetado como de venta libre, excepto si se lo indica el mdico o el farmacutico.  La warfarina puede tener efectos adversos, tales como hematomas o sangrado en exceso. Si se lastima, deber ejercer presin sobre las heridas por ms tiempo que lo habitual. Su mdico o su farmacutico le comentarn otros posibles efectos secundarios.  Evite practicar deportes que puedan causar lesiones o sangrado.  Sea cuidadoso al afeitarse, utilizar hilo dental, o manipular objetos cortantes.  El alcohol puede modificar la capacidad del organismo de metabolizar la warfarina. Es preferible evitar las bebidas alcohlicas o consumir solo cantidades muy pequeas cuando se est en tratamiento con warfarina. Hgale saber a su mdico si modifica su consumo de alcohol.  Informe a su dentista o a otros profesionales antes de que le efecten cualquier procedimiento.  Si los estudios   han determinado la presencia de su reflejo de deglucin, debera ingerir alimentos  saludables. Una dieta que incluya 5o ms porciones de frutas y vegetales por da puede reducir el riesgo de ictus. Los alimentos deben tener una consistencia especial (blandos o hechos pur) o los bocados deben ser pequeos para no aspirarse o ahogarse. Se pueden indicar determinados cambios alimenticios para controlar la hipertensin arterial, los niveles elevados de colesterol, la diabetes o la obesidad.  Para controlar la hipertensin arterial, se recomiendan alimentos con bajo contenido de sodio, grasas saturadas, grasas (trans) y colesterol.  Los niveles de colesterol se pueden controlar con alimentos que tienen alto contenido de fibra y bajo contenido de grasas saturadas, grasas trans y colesterol.  Para controlar la diabetes, se recomienda controlar el consumo de carbohidratos y azcar.  Para controlar la obesidad, se recomienda reducir el consumo de caloras y elegir alimentos con bajo contenido de sodio, grasas saturadas, grasas trans y colesterol.  Mantenga un peso saludable.  Mantngase fsicamente activo. Se recomienda que realice al menos 30minutos de actividad todos o casi todos los das.  No consuma ningn producto que contenga tabaco, como cigarrillos, tabaco de mascar o cigarrillos electrnicos.  Limite el consumo de alcohol aunque no tome warfarina. Se considera consumo moderado:  No ms de 2 medidas por da para los hombres.  No ms de 1 medida por da para las mujeres que no estn embarazadas.  La seguridad en el hogar. Un ambiente seguro en el hogar es importante para reducir el riesgo de cadas. El mdico podr disponer que los especialistas evalen su hogar. Tambin es importante colocar barras para sostn en la habitacin y el bao. El mdico le indicar un equipo para usar en el hogar, como un inodoro elevado y un asiento para la ducha.  Terapia fsica, ocupacional y del habla. Tal vez sea necesario realizar un tratamiento continuo para optimizar la recuperacin  despus de un ictus. Si le aconsejaron el uso de un bastn o andador, selo en todo momento. Concurra a todas las sesiones teraputicas.  Siga todas las indicaciones en cuanto a los controles con el mdico. Esto es muy importante. Aqu se incluyen interconsultas, fisioterapia, rehabilitacin y anlisis de laboratorio. El control apropiado puede evitar que se produzca otro ictus. SOLICITE ATENCIN MDICA SI:  Hay cambios en su personalidad.  Tiene dificultad para tragar.  Tiene visin doble  Tiene mareos.  Tiene fiebre.  Tiene lceras en la piel. SOLICITE ATENCIN MDICA DE INMEDIATO SI:  Cualquiera de estos sntomas puede representar un problema grave y es una emergencia. No espere a que los sntomas desaparezcan. Solicite atencin mdica de inmediato. Comunquese con el servicio de emergencias de su localidad (911 en los [place]). No conduzca por sus propios medios hasta el hospital.  Siente debilidad o adormecimiento sbito de la cara, el brazo o la pierna, especialmente en un lado del cuerpo.  Tiene una dificultad repentina para caminar o para mover los brazos o las piernas.  Se siente repentinamente confundido.  Tiene dificultad para hablar (afasia) o comprender.  Presenta, sbitamente, dificultad para ver de uno o ambos ojos.  Pierde el equilibrio o la coordinacin.  Siente sbitamente un dolor de cabeza intenso que no tiene causa aparente.  Siente un nuevo dolor en el pecho o latidos cardacos irregulares.  Sufre la prdida parcial o total de la conciencia. Document Released: 07/26/2005 Document Revised: 03/02/2014 ExitCare Patient Information 2015 [employer]. This information is not intended to replace advice given to you by your health   care provider. Make sure you discuss any questions you have with your health care provider.  

## 2015-02-15 NOTE — Progress Notes (Signed)
Quick Note:  Labs addressed at office as well as medications and patient was made aware. ______ 

## 2015-02-16 ENCOUNTER — Encounter (HOSPITAL_COMMUNITY): Payer: Self-pay

## 2015-02-16 ENCOUNTER — Emergency Department (HOSPITAL_COMMUNITY): Payer: Medicaid Other

## 2015-02-16 ENCOUNTER — Emergency Department (HOSPITAL_COMMUNITY)
Admission: EM | Admit: 2015-02-16 | Discharge: 2015-02-16 | Disposition: A | Discharge status: Home / Self Care | Payer: Medicaid Other | Attending: Emergency Medicine | Admitting: Emergency Medicine

## 2015-02-16 DIAGNOSIS — N186 End stage renal disease: Secondary | ICD-10-CM

## 2015-02-16 DIAGNOSIS — Z79899 Other long term (current) drug therapy: Secondary | ICD-10-CM | POA: Diagnosis not present

## 2015-02-16 DIAGNOSIS — Z794 Long term (current) use of insulin: Secondary | ICD-10-CM | POA: Insufficient documentation

## 2015-02-16 DIAGNOSIS — Z862 Personal history of diseases of the blood and blood-forming organs and certain disorders involving the immune mechanism: Secondary | ICD-10-CM | POA: Diagnosis not present

## 2015-02-16 DIAGNOSIS — Z86718 Personal history of other venous thrombosis and embolism: Secondary | ICD-10-CM | POA: Diagnosis not present

## 2015-02-16 DIAGNOSIS — Z8659 Personal history of other mental and behavioral disorders: Secondary | ICD-10-CM | POA: Insufficient documentation

## 2015-02-16 DIAGNOSIS — I120 Hypertensive chronic kidney disease with stage 5 chronic kidney disease or end stage renal disease: Secondary | ICD-10-CM | POA: Diagnosis not present

## 2015-02-16 DIAGNOSIS — Z992 Dependence on renal dialysis: Secondary | ICD-10-CM | POA: Diagnosis not present

## 2015-02-16 DIAGNOSIS — R569 Unspecified convulsions: Secondary | ICD-10-CM | POA: Insufficient documentation

## 2015-02-16 DIAGNOSIS — E119 Type 2 diabetes mellitus without complications: Secondary | ICD-10-CM | POA: Insufficient documentation

## 2015-02-16 LAB — CBC WITH DIFFERENTIAL/PLATELET
BASOS ABS: 0.1 10*3/uL (ref 0.0–0.1)
Basophils Relative: 1 % (ref 0–1)
EOS ABS: 0.2 10*3/uL (ref 0.0–0.7)
EOS PCT: 2 % (ref 0–5)
HEMATOCRIT: 31.4 % — AB (ref 36.0–46.0)
Hemoglobin: 10.1 g/dL — ABNORMAL LOW (ref 12.0–15.0)
Lymphocytes Relative: 17 % (ref 12–46)
Lymphs Abs: 2 10*3/uL (ref 0.7–4.0)
MCH: 30.3 pg (ref 26.0–34.0)
MCHC: 32.2 g/dL (ref 30.0–36.0)
MCV: 94.3 fL (ref 78.0–100.0)
MONO ABS: 0.6 10*3/uL (ref 0.1–1.0)
Monocytes Relative: 6 % (ref 3–12)
Neutro Abs: 8.3 10*3/uL — ABNORMAL HIGH (ref 1.7–7.7)
Neutrophils Relative %: 74 % (ref 43–77)
PLATELETS: 742 10*3/uL — AB (ref 150–400)
RBC: 3.33 MIL/uL — ABNORMAL LOW (ref 3.87–5.11)
RDW: 17.7 % — AB (ref 11.5–15.5)
WBC: 11.2 10*3/uL — ABNORMAL HIGH (ref 4.0–10.5)

## 2015-02-16 LAB — BASIC METABOLIC PANEL
ANION GAP: 19 — AB (ref 5–15)
BUN: 18 mg/dL (ref 6–23)
CHLORIDE: 85 mmol/L — AB (ref 96–112)
CO2: 28 mmol/L (ref 19–32)
Calcium: 8.4 mg/dL (ref 8.4–10.5)
Creatinine, Ser: 4.22 mg/dL — ABNORMAL HIGH (ref 0.50–1.10)
GFR calc non Af Amer: 12 mL/min — ABNORMAL LOW (ref >90)
GFR, EST AFRICAN AMERICAN: 14 mL/min — AB (ref >90)
Glucose, Bld: 86 mg/dL (ref 70–99)
POTASSIUM: 3.6 mmol/L (ref 3.5–5.1)
Sodium: 132 mmol/L — ABNORMAL LOW (ref 135–145)

## 2015-02-16 LAB — CBG MONITORING, ED: Glucose-Capillary: 93 mg/dL (ref 70–99)

## 2015-02-16 LAB — MAGNESIUM: Magnesium: 2.3 mg/dL (ref 1.5–2.5)

## 2015-02-16 MED ORDER — LEVETIRACETAM 500 MG PO TABS: 500.0000 mg | ORAL_TABLET | Freq: Every day | ORAL | Status: DC

## 2015-02-16 MED ORDER — SODIUM CHLORIDE 0.9 % IV SOLN
1000.0000 mg | Freq: Once | INTRAVENOUS | Status: AC
  Administered 2015-02-16: 1000 mg via INTRAVENOUS
  Filled 2015-02-16: qty 10

## 2015-02-16 NOTE — Progress Notes (Signed)
ED CM received consult concerning medication assistance. 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 Spoke with patient and family at bedside. Patient recently was discharged from hospital s/p CVA. Patient is uninsured and has received MATCH in the past she is not eligible. [facility]. PMH CVA, DM, ESRD. Patient evaluated in the ED and placed on Keppra which is $10 patient states she can afford. Family also states, that patient has been off her DM meds since her hospitalization. [facility]. No further ED CM needs identified.

## 2015-02-16 NOTE — Discharge Instructions (Signed)
On days you have dialysis, take the medication AFTER you have finished your dialysis.  Convulsiones en el adulto ( Seizure, Adult) Neomia Dear convulsin es una actividad elctrica anormal en el cerebro. Generalmente duran entre 30 segundos y 2 minutos. Hay varios tipos de convulsiones. Antes de una convulsin, puede tener una sensacin de advertencia (aura) que indica que la convulsin est a punto de Radiation protection practitioner. Un aura puede incluir los siguientes sntomas:   [place] o ansiedad.  Nuseas.  Sentir que la habitacin da vueltas (vrtigo).  Cambios en la visin, como ver destellos de luz o [place]. Los sntomas ms comunes durante un ataque son:  Un cambio en la atencin o el comportamiento (estado mental alterado).  Convulsiones con sacudidas rtmicas.  Babeo.  Movimientos rpidos de los ojos.  Gruidos.  Prdida del control del intestino y la vejiga.  Sabor amargo en la boca.  Mordeduras de [place]. Despus de un ataque, puede ser que se sienta confundido y adormilado. Tambin podra sufrir una lesin durante la convulsin. INSTRUCCIONES PARA EL CUIDADO EN EL HOGAR   Si le recetaron medicamentos, tmelos exactamente segn se lo indique su mdico.  Cumpla con todas las visitas de control, segn le indique su mdico.  No nade ni conduzca ni participe en actividades [facility] las que una convulsin podra causarle una lesin mayor a usted o a otros hasta que el mdico lo autorice.  Descanse adecuadamente.  Ensee a sus amigos y familiares lo que debe hacer si tiene una convulsin. Ellos deben:  Acostarlo en el suelo para evitar que se caiga.  Poner una almohada debajo de su cabeza.  Aflojar la ropa apretada alrededor de su cuello.  Recostarlo sobre un lado. En caso de tener vmitos, esto ayuda a [employer] vas area despejadas.  Lennie Hummer con usted hasta que se recupere.  Sepa si necesita atencin de emergencia o no. SOLICITE ATENCIN MDICA DE INMEDIATO SI:  La  convulsin dura ms de cinco minutos.  La convulsin es grave o no se despierta de inmediato despus de la convulsin.  Tiene un estado mental alterado despus de la convulsin.  Tiene convulsiones ms frecuentes o ms graves. Alguien debe llevarlo al departamento de emergencias o llamar a los servicios de emergencias locales (911 en [place] ASEGRESE DE QUE:  Comprende estas instrucciones.  Controlar su afeccin.  Recibir ayuda de inmediato si no mejora o si empeora. Document Released: 07/26/2005 Document Revised: 08/06/2013 [facility] Patient Information 2015 [place], [place]. This information is not intended to replace advice given to you by your health care provider. Make sure you discuss any questions you have with your health care provider.  Levetiracetam tablets Qu es este medicamento? El LEVETIRACETAM es un medicamento antiepilptico. Se utiliza con otros medicamentos para tratar ciertos tipos de convulsiones. Este medicamento puede ser utilizado para otros usos; si tiene alguna pregunta consulte con su proveedor de atencin mdica o con su farmacutico. MARCAS COMERCIALES DISPONIBLES: Keppra Qu le debo informar a mi profesional de la salud antes de tomar este medicamento? Necesita saber si usted presenta alguno de los [employer] o situaciones: -enfermedad renal -ideas suicidas, planes o intento; si usted o alguien de su familia ha intentado un suicidio previo -una reaccin alrgica o inusual al levetiracetam, a otros medicamentos, alimentos, colorantes o conservantes -si est embarazada o buscando quedar embarazada -si est amamantando a un beb Cmo debo utilizar este medicamento? Tome este medicamento por va oral con un vaso de agua. Siga las instrucciones de la etiqueta del [place]ail. Trague las  tabletas enteras. No triture o CenterPoint Energymastique este medicamento. Este medicamento se puede tomar con o sin alimentos. Tome sus dosis a intervalos regulares. No tome su  medicamento con una frecuencia mayor que la indicada. No deje de tomar este medicamento ni ninguno de sus medicamentos para las convulsiones a menos que as lo indique su mdico o su profesional de Radiographer, therapeuticla salud. Si suspende repentinamente su medicamento, el nmero de convulsiones o su gravedad puede aumentar. Su farmacutico le dar una [place] del medicamento especial con cada receta y relleno. Asegrese de leer esta informacin cada vez cuidadosamente. Consulte a su pediatra o profesional de la salud acerca del uso de [place] medicamento en nios. Aunque este medicamento se puede recetar a nios tan menores como de 4 aos de edad para condiciones selectivas, las precauciones se aplican. Sobredosis: Pngase en contacto inmediatamente con un centro toxicolgico o una sala de urgencia si usted cree que haya tomado demasiado medicamento. ATENCIN: Reynolds AmericanEste medicamento es solo para usted. No comparta este medicamento con nadie. Qu sucede si me olvido de una dosis? Si olvida una dosis, tmela lo antes posible. Si es casi la hora de la prxima dosis, tome slo esa dosis. No tome dosis adicionales o dobles. Qu puede interactuar con este medicamento? Esta medicina puede interactuar con los siguientes medicamentos: -carbamazepina -colesevelam -probenecida -sevelamer Puede ser que esta lista no menciona todas las posibles interacciones. Informe a su profesional de Beazer Homesla salud de [employer] Inctodos los productos a base de hierbas, medicamentos de Woodlynneventa libre o suplementos nutritivos que est tomando. Si usted fuma, consume bebidas alcohlicas o si utiliza drogas ilegales, indqueselo tambin a su profesional de Beazer Homesla salud. Algunas sustancias pueden interactuar con su medicamento. A qu debo estar atento al usar PPL Corporationeste medicamento? Visite a su mdico o a su profesional de la salud para chequear su evolucin peridicamente. Use una pulsera o collar de identificacin mdica que indique que tiene epilepsia y lleve una tarjeta que indique todos  sus medicamentos. Es importante que tome este medicamento exactamente como su profesional de la salud le haya indicado. Al comenzar el tratamiento, es posible que su mdico deba ajustar su dosis. Es posible que transcurran semanas o meses hasta que su dosis se estabilice. Debe comunicarse con su mdico o con su profesional de la salud si sus convulsiones empeoran o si experimenta un nuevo tipo de convulsiones. Puede experimentar somnolencia o mareos. No conduzca ni utilice maquinaria ni haga nada que Scientist, research (life sciences)le exija permanecer en estado de alerta hasta que sepa cmo le afecta este medicamento. No se siente ni se ponga de pie con rapidez, especialmente si es un paciente de edad avanzada. Esto reduce el riesgo de mareos o Newell Rubbermaiddesmayos. El alcohol puede interferir con el efecto de [place] medicamento. Evite consumir bebidas alcohlicas. El uso de este medicamento puede aumentar la posibilidad de Wilburt Finlaytener ideas o comportamiento suicida. Presta atencin a como usted responde al medicamento mientras est usndolo. Cualquier empeoramiento de humor o ideas de suicidio o de morir, informe a su profesional de la salud inmediatamente. Las mujeres que se encuentran Database administratorembarazadas mientras usan este medicamento pueden inscribirse en el registro del Sprint Nextel Corporationorth American Antiepileptic Drug Pregnancy Registry (Registro estadounidense de Psychiatristmbarazo de Medicamentos Antiepilpticos) llamando al telfono [phone]. Este registro recoge informacin acerca de la seguridad del uso de medicamentos antiepilpticos durante el Psychiatristembarazo. Qu efectos secundarios puedo tener al Boston Scientificutilizar este medicamento? Efectos secundarios que debe informar a su mdico o a Producer, television/film/videosu profesional de la salud tan pronto como sea posible: -reacciones alrgicas como erupcin cutnea,  picazn o urticarias, hinchazn de la cara, labios o lengua -problemas respiratorios -orina de color amarillo oscuro -sensacin general de estar enfermo o sntomas gripales -problemas de coordinacin,  del habla, al caminar -cansancio o debilidad inusual -empeoramiento de humor, ideas o actos de suicidio o de morir -color amarillento de los ojos o la piel Efectos secundarios que, por lo general, no requieren Psychologist, prison and probation services (debe informarlos a su mdico o a su profesional de la salud si persisten o si son molestos): -diarrea -mareos, somnolencia -dolor de [place] -prdida del apetito Puede ser que esta lista no menciona todos los posibles efectos secundarios. Comunquese a su mdico por asesoramiento mdico [employer]ard. Usted puede informar los efectos secundarios a la FDA por telfono al 1-800-FDA-1088. Dnde debo guardar mi medicina? Mantngala fuera del alcance de los nios. Gurdela a [employer]I, entre 15 y 30 grados C (61 y 78 grados F). Deseche todo el medicamento que no haya utilizado, despus de la fecha de vencimiento. ATENCIN: Este folleto es un resumen. Puede ser que no cubra toda la posible informacin. Si usted tiene preguntas acerca de esta medicina, consulte con su mdico, su farmacutico o su profesional de Radiographer, therapeutic.  2015, Elsevier/Gold Standard. (2013-09-22 10:43:21)

## 2015-02-16 NOTE — ED Provider Notes (Signed)
CSN: [id number]     Arrival date & time 02/16/15  1712 History   First MD Initiated Contact with Patient 02/16/15 1808     Chief Complaint  Patient presents with  . Seizures     (Consider location/radiation/quality/duration/timing/severity/associated sxs/prior Treatment) Patient is a 44 y.o. female presenting with seizures. The history is provided by a relative. The history is limited by a developmental delay.  Seizures She had a seizure while at dialysis today. She did complete her full dialysis session. Seizure was witnessed by dialysis personnel who are not immediately available on. However, she had recently been hospitalized for a stroke and a family member is with her who witnessed 2 seizures while she was in the hospital. She describes a tonic-clonic seizure which lasted about 2 minutes with associated fecal incontinence. There was no bit lip or tongue. This seizure in the hospital have been witnessed by a nurse but there was some question from her physicians as to whether was actually seizures so she was not started on any medication.  Past Medical History  Diagnosis Date  . Renal disorder     ESRD  . Diabetes mellitus     Type II  . Anemia   . Thyroid disease     Hyperpara-thyroidism  . DVT (deep venous thrombosis)   . Hypertension   . GERD (gastroesophageal reflux disease)   . Depression   . Seizures     as a child  . Complication of anesthesia   . PONV (postoperative nausea and vomiting)   . Atrial fibrillation    Past Surgical History  Procedure Laterality Date  . Av fistula placement Right 05/26/2013    Procedure: Right Brachiocephalic  ARTERIOVENOUS (AV) FISTULA CREATION;  Surgeon: Fransisco Hertz, MD;  Location: [facility] OR;  Service: Vascular;  Laterality: Right;  . Hemodialysis cath Left   . Revison of arteriovenous fistula Right 11/19/2013    Procedure: SUPERFICIALIZATION OF RIGHT BRACHIOCEPHALIC ARTERIOVENOUS FISTULA WITH SIDE BRANCH LIGATION; ULTRASOUND GUIDED;   Surgeon: Fransisco Hertz, MD;  Location: [facility] OR;  Service: Vascular;  Laterality: Right;   Family History  Problem Relation Age of Onset  . Heart attack Mother   . Heart disease Mother     Heart Disease before age 29  . Diabetes Sister   . Kidney disease Father    History  Substance Use Topics  . Smoking status: Never Smoker   . Smokeless tobacco: Never Used  . Alcohol Use: No   OB History    No data available     Review of Systems  Unable to perform ROS: Other  Neurological: Positive for seizures.      Allergies  Review of patient's allergies indicates no known allergies.  Home Medications   Prior to Admission medications   Medication Sig Start Date End Date Taking? Authorizing Provider  amLODipine (NORVASC) 10 MG tablet Take 10 mg by mouth daily.    Historical Provider, MD  Calcium Carbonate Antacid (TUMS PO) Take 200 mg by mouth 3 (three) times daily.    Historical Provider, MD  insulin lispro (HUMALOG) 100 UNIT/ML injection Inject 2-10 Units into the skin 3 (three) times daily before meals.    Historical Provider, MD  lisinopril (PRINIVIL,ZESTRIL) 40 MG tablet Take 40 mg by mouth daily.    Historical Provider, MD  oxyCODONE (ROXICODONE) 5 MG immediate release tablet Take 1 tablet (5 mg total) by mouth every 4 (four) hours as needed for severe pain. 12/10/13   Carma Lair Nickel,  NP   BP 108/62 mmHg  Pulse 82  Resp 18  SpO2 94% Physical Exam  Nursing note and vitals reviewed.  44 year old female, resting comfortably and in no acute distress. Vital signs are normal. Oxygen saturation is 94%, which is normal. Head is normocephalic and atraumatic. PERRLA, EOMI. Oropharynx is clear. Neck is nontender and supple without adenopathy or JVD. There are no carotid bruits. Back is nontender and there is no CVA tenderness. Lungs are clear without rales, wheezes, or rhonchi. Chest is nontender. Heart has regular rate and rhythm without murmur. Abdomen is soft, flat, nontender  without masses or hepatosplenomegaly and peristalsis is normoactive. Extremities have no cyanosis or edema, full range of motion is present. AV fistula is present in the right upper arm with thrill present. Skin is warm and dry without rash. Neurologic: She is awake and oriented and able to answer questions, cranial nerves are intact, there are no gross motor or sensory deficits.  ED Course  Procedures (including critical care time) Labs Review Results for orders placed or performed during the hospital encounter of 02/16/15  Basic metabolic panel  Result Value Ref Range   Sodium 132 (L) 135 - 145 mmol/L   Potassium 3.6 3.5 - 5.1 mmol/L   Chloride 85 (L) 96 - 112 mmol/L   CO2 28 19 - 32 mmol/L   Glucose, Bld 86 70 - 99 mg/dL   BUN 18 6 - 23 mg/dL   Creatinine, Ser 1.614.22 (H) 0.50 - 1.10 mg/dL   Calcium 8.4 8.4 - 09.610.5 mg/dL   GFR calc non Af Amer 12 (L) >90 mL/min   GFR calc Af Amer 14 (L) >90 mL/min   Anion gap 19 (H) 5 - 15  CBC with Differential  Result Value Ref Range   WBC 11.2 (H) 4.0 - 10.5 K/uL   RBC 3.33 (L) 3.87 - 5.11 MIL/uL   Hemoglobin 10.1 (L) 12.0 - 15.0 g/dL   HCT 04.531.4 (L) 40.936.0 - 81.146.0 %   MCV 94.3 78.0 - 100.0 fL   MCH 30.3 26.0 - 34.0 pg   MCHC 32.2 30.0 - 36.0 g/dL   RDW 91.417.7 (H) 78.211.5 - 95.615.5 %   Platelets 742 (H) 150 - 400 K/uL   Neutrophils Relative % 74 43 - 77 %   Neutro Abs 8.3 (H) 1.7 - 7.7 K/uL   Lymphocytes Relative 17 12 - 46 %   Lymphs Abs 2.0 0.7 - 4.0 K/uL   Monocytes Relative 6 3 - 12 %   Monocytes Absolute 0.6 0.1 - 1.0 K/uL   Eosinophils Relative 2 0 - 5 %   Eosinophils Absolute 0.2 0.0 - 0.7 K/uL   Basophils Relative 1 0 - 1 %   Basophils Absolute 0.1 0.0 - 0.1 K/uL  Magnesium  Result Value Ref Range   Magnesium 2.3 1.5 - 2.5 mg/dL  POC CBG, ED  Result Value Ref Range   Glucose-Capillary 93 70 - 99 mg/dL   Imaging Review Ct Head Wo Contrast  02/16/2015   CLINICAL DATA:  Seizure. Personal history of seizures since childhood.  EXAM: CT  HEAD WITHOUT CONTRAST  TECHNIQUE: Contiguous axial images were obtained from the base of the skull through the vertex without intravenous contrast.  COMPARISON:  None.  FINDINGS: The brain does not show generalized atrophy. There is a 1 cm region of low density in the left frontal white matter consistent with an old white matter insult. Otherwise, the brain has normal appearance by CT. No  evidence of mass effect. No hemorrhage, hydrocephalus or extra-axial collection. The calvarium is unremarkable. Sinuses, middle ears and mastoids are clear. Frontal sinuses are aplastic.  IMPRESSION: No acute finding by CT. 1 cm low density in the left frontal white matter likely to represent an old nonspecific white matter insult.   Electronically Signed   By: Paulina Fusi M.D.   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 Old records are reviewed and I do see nursing note referencing the seizure but I do not see any notes from physicians mentioning this seizure. She will need to be started on anticonvulsant therapy. She is given a dose of levetiracetam. Screening labs will be repeated, as well CT of head.  Workup is unremarkable. She was being prepared for discharge when family expressed concern that they would not be able to afford her medication. Case management has been consulted and they are able to work out a minute for her medication. She is discharged with prescription for levetiracetam which she is to take once a day dose has been adjusted down because of end-stage renal disease. On dialysis days, she is to make sure the medication is taken after completing dialysis.Dione Booze, MD 02/21/15 1520

## 2015-02-16 NOTE — ED Notes (Signed)
Pt transported to CT ?

## 2015-02-16 NOTE — ED Notes (Signed)
Paramedics unable to describe Seizure , due to staff at dialysis was unable to describe the seizure,  When pt. Arrived, no signs of injury to the tongue, no signs of incontinence.

## 2015-02-22 ENCOUNTER — Encounter: Payer: Self-pay | Admitting: Family Medicine

## 2015-02-22 ENCOUNTER — Ambulatory Visit: Payer: Medicaid Other | Attending: Family Medicine | Admitting: Family Medicine

## 2015-02-22 VITALS — BP 132/81 | HR 74 | Temp 98.3°F | Resp 18 | Ht <= 58 in | Wt 127.8 lb

## 2015-02-22 DIAGNOSIS — R569 Unspecified convulsions: Secondary | ICD-10-CM | POA: Diagnosis not present

## 2015-02-22 DIAGNOSIS — E1149 Type 2 diabetes mellitus with other diabetic neurological complication: Secondary | ICD-10-CM | POA: Diagnosis not present

## 2015-02-22 DIAGNOSIS — N186 End stage renal disease: Secondary | ICD-10-CM | POA: Diagnosis not present

## 2015-02-22 DIAGNOSIS — K5909 Other constipation: Secondary | ICD-10-CM | POA: Diagnosis not present

## 2015-02-22 DIAGNOSIS — E088 Diabetes mellitus due to underlying condition with unspecified complications: Secondary | ICD-10-CM | POA: Insufficient documentation

## 2015-02-22 DIAGNOSIS — I10 Essential (primary) hypertension: Secondary | ICD-10-CM | POA: Diagnosis not present

## 2015-02-22 DIAGNOSIS — E1140 Type 2 diabetes mellitus with diabetic neuropathy, unspecified: Secondary | ICD-10-CM | POA: Insufficient documentation

## 2015-02-22 LAB — GLUCOSE, POCT (MANUAL RESULT ENTRY): POC Glucose: 139 mg/dl — AB (ref 70–99)

## 2015-02-22 MED ORDER — LEVETIRACETAM 500 MG PO TABS: 500.0000 mg | ORAL_TABLET | Freq: Once | ORAL | Status: AC

## 2015-02-22 MED ORDER — LACTULOSE 10 GM/15ML PO SOLN: 10.0000 g | Freq: Three times a day (TID) | ORAL | Status: AC

## 2015-02-22 MED ORDER — GABAPENTIN 100 MG PO CAPS: 100.0000 mg | ORAL_CAPSULE | Freq: Every day | ORAL | Status: AC

## 2015-02-22 NOTE — Progress Notes (Signed)
Quick Note:  Labs addressed at office as well as medications and patient was made aware. ______ 

## 2015-02-22 NOTE — Patient Instructions (Signed)
Convulsiones en el adulto (Seizure, Adult)  Una convulsin significa que hay una actividad inusual en el cerebro. Puede causar modificaciones en la atencin o en la conducta. Las convulsiones causan sacudidas (convulsiones). Generalmente duran entre 30 segundos y 2 minutos.  CUIDADOS EN EL HOGAR   Si le recetaron medicamentos, tmelos exactamente segn las indicaciones del mdico.  Cumpla con las visitas de control tal como se le indic.  No No conduzca vehculos ni practique natacin hasta que el mdico lo autorice.  Explique a Midwifeotras personas qu deben hacer si usted sufre una convulsin. Ellos tienen que:  Recostarlo en el suelo.  Colocar un almohadn debajo de su cabeza.  Aflojar toda la ropa ajustada que pueda tener alrededor del cuello.  Colocarlo de lado.  Permanecer con usted hasta que se sienta mejor. SOLICITE AYUDA DE INMEDIATO SI:   La convulsin dura ms de 2 a 5 minutos.  La convulsin es muy intensa.  La persona no se despierta despus de sufrir una convulsin.  La atencin o la conducta se modifican. Lleve a la Health visitorpersona hasta un servicio de emergencias o comunquese inmediatamente con el servicio de urgencias de su localidad (911 en los [place]). ASEGRESE DE QUE:   Comprende estas instrucciones.  Controlar su enfermedad.  Solicitar ayuda de inmediato si no mejora o empeora. Document Released: 11/18/2010 Document Revised: 01/08/2012 [facility] Patient Information 2015 [place], [place]. This information is not intended to replace advice given to you by your health care provider. Make sure you discuss any questions you have with your health care provider.

## 2015-02-22 NOTE — Progress Notes (Signed)
Subjective:    Patient ID: Natalie [name], female    DOB: 30-Mar-1971, 44 y.o.   MRN: [id number]  HPI  Seen with the aid of a Spanish interpreter from language resources: [name].  44 year old woman with a history of type 2 diabetes mellitus, end-stage renal disease on hemodialysis, hypertension, hypothyroidism who recently presented to [facility] ED with seizure occurring during dialysis which was tonic-clonic in associated with fecal incontinence. CT head revealed no acute findings. Keppra was commenced and she was instructed to take it after dialysis.  Of note she was recently hospitalized for a stroke with right-sided hemiparesis and mild aphasia  On 01/23/15 - 02/11/15 she underwent physical and occupation therapy but no anticonvulsant was started at the time.  Interval History: She recently had another 4 days ago during dialysis but did not present to the emergency room and caregiver reports that she has been compliant with all medications. Of note she has had an episode of low blood sugar of about 56 which occurred a week ago around early hours of the morning but subsequently blood sugars have ranged in the 170s fasting. She is scheduled to see neurology in June.   Review of Systems  Constitutional: Negative for activity change, appetite change and fatigue.  HENT: Negative for congestion, sinus pressure and sore throat.   Eyes: Negative for visual disturbance.  Respiratory: Negative for cough, chest tightness, shortness of breath and wheezing.   Cardiovascular: Negative for chest pain and palpitations.  Gastrointestinal: Positive for abdominal pain and constipation. Negative for abdominal distention.  Endocrine: Negative for polydipsia.  Genitourinary: Negative for dysuria and frequency.  Musculoskeletal: Negative for back pain and arthralgias.  Skin: Negative for rash.  Neurological: Positive for numbness. Negative for tremors and light-headedness.  Hematological: Does  not bruise/bleed easily.  Psychiatric/Behavioral: Negative for behavioral problems and agitation.         Objective: Filed Vitals:   02/22/15 1216  BP: 132/81  Pulse: 74  Temp: 98.3 F (36.8 C)  Resp: 18       Physical Exam  Constitutional: She appears well-developed and well-nourished. No distress.  HENT:  Head: Normocephalic.  Right Ear: External ear normal.  Left Ear: External ear normal.  Nose: Nose normal.  Mouth/Throat: Oropharynx is clear and moist.  Eyes: Conjunctivae and EOM are normal. Pupils are equal, round, and reactive to light.  Neck: Normal range of motion. No JVD present.  Cardiovascular: Normal rate, regular rhythm, normal heart sounds and intact distal pulses.  Exam reveals no gallop.   No murmur heard. Pulmonary/Chest: Effort normal and breath sounds normal. No respiratory distress. She has no wheezes. She has no rales. She exhibits no tenderness.  Abdominal: Soft. Bowel sounds are normal. She exhibits no distension and no mass. There is no tenderness.  Musculoskeletal: Normal range of motion. She exhibits no edema or tenderness.  Neurological:  Mild mental retardation  Skin: She is not diaphoretic.  Right arm with vertical surgical scar and AV access  Psychiatric: She has a normal mood and affect.       EXAM: CT HEAD WITHOUT CONTRAST  TECHNIQUE: Contiguous axial images were obtained from the base of the skull through the vertex without intravenous contrast.  COMPARISON: None.  FINDINGS: The brain does not show generalized atrophy. There is a 1 cm region of low density in the left frontal white matter consistent with an old white matter insult. Otherwise, the brain has normal appearance by CT. No evidence of mass  effect. No hemorrhage, hydrocephalus or extra-axial collection. The calvarium is unremarkable. Sinuses, middle ears and mastoids are clear. Frontal sinuses are aplastic.  IMPRESSION: No acute finding by CT. 1 cm low density  in the left frontal white matter likely to represent an old nonspecific white matter insult.   Electronically Signed  By: Paulina FusiMark Shogry M.D.  On: 02/16/2015 19:25     Assessment & Plan:  44 year old female with type 2 diabetes mellitus, hypertension, end-stage renal disease on hemodialysis and new onset seizures associated with dialysis.  Seizures: Still having active seizures associated with dialysis. I have increased her Keppra dose to twice daily on days she has dialysis and she will remain on once daily when she is off dialysis. Scheduled to see neurology and advised to keep appointment.  Constipation: Commenced on lactulose, dietary modifications advised to increase fiber.  Type 2 diabetes mellitus with associated diabetic neuropathy Controlled with an A1c of 7.8 and 12/2014 Hypoglycemic episode was reported only on one occasion and have fasting sugars still remain in the 170s and so I will make no medication changes at this time. Commence gabapentin at renal dosing for peripheral neuropathy  Hypertension: Controlled, continue medications  End-stage renal disease on hemodialysis: Continue dialysis as per protocol on Tuesday, Thursday and Saturday disease.

## 2015-02-22 NOTE — Progress Notes (Signed)
Patient went to ED for seizures. Patient had seizures during dialysis treatment last week. Patient was placed on Keppra. Seizures only happen during dialysis. Fasting glucose levels yesterday 171, today 197. Glucose levels dropped to 52 during the night approximately 2 weeks ago. Patient reports having difficulty breathing on dialysis days when she has seizures.

## 2015-03-02 DIAGNOSIS — E119 Type 2 diabetes mellitus without complications: Secondary | ICD-10-CM

## 2015-03-02 DIAGNOSIS — I69351 Hemiplegia and hemiparesis following cerebral infarction affecting right dominant side: Secondary | ICD-10-CM

## 2015-03-02 DIAGNOSIS — D649 Anemia, unspecified: Secondary | ICD-10-CM

## 2015-03-02 DIAGNOSIS — I120 Hypertensive chronic kidney disease with stage 5 chronic kidney disease or end stage renal disease: Secondary | ICD-10-CM

## 2015-03-02 DIAGNOSIS — I69322 Dysarthria following cerebral infarction: Secondary | ICD-10-CM

## 2015-03-02 DIAGNOSIS — N186 End stage renal disease: Secondary | ICD-10-CM

## 2015-03-09 ENCOUNTER — Ambulatory Visit: Payer: Self-pay

## 2015-03-10 ENCOUNTER — Ambulatory Visit: Payer: Self-pay | Attending: Family Medicine

## 2015-03-15 ENCOUNTER — Encounter: Payer: Self-pay | Admitting: Physical Medicine & Rehabilitation

## 2015-03-15 ENCOUNTER — Ambulatory Visit (HOSPITAL_BASED_OUTPATIENT_CLINIC_OR_DEPARTMENT_OTHER): Payer: Self-pay | Admitting: Physical Medicine & Rehabilitation

## 2015-03-15 ENCOUNTER — Encounter: Payer: Self-pay | Attending: Physical Medicine & Rehabilitation

## 2015-03-15 VITALS — BP 140/90 | HR 80 | Resp 14

## 2015-03-15 DIAGNOSIS — G8190 Hemiplegia, unspecified affecting unspecified side: Secondary | ICD-10-CM | POA: Insufficient documentation

## 2015-03-15 DIAGNOSIS — N186 End stage renal disease: Secondary | ICD-10-CM

## 2015-03-15 DIAGNOSIS — I639 Cerebral infarction, unspecified: Secondary | ICD-10-CM

## 2015-03-15 DIAGNOSIS — G8191 Hemiplegia, unspecified affecting right dominant side: Secondary | ICD-10-CM

## 2015-03-15 NOTE — Patient Instructions (Signed)
May take tylenol 2 tablets up to 3 times a day for pain  Please call if you want me to order Right hand xray

## 2015-03-15 NOTE — Progress Notes (Signed)
Subjective:    Patient ID: Natalie FryIdalia Rubio [name], female    DOB: May 22, 1971, 44 y.o.   MRN: [id number] Admit date: 02/01/2015 Discharge date: 02/11/2015 44 y.o. RH-non english speaking female with history of hypertension,mild MR, ESRD-HD TTS who was admitted on  01/23/2015 with difficulty swallowing with drooling, dysarthria, R-sided weakness with increased difficulty walking.  MRI of the brain showed acute brainstem infarct, left paracentral pons lacune with no mass effect as well as chronic lacunar infarcts of the right pons and left basal ganglia corona radiata.  TSH was elevated at 18.05 and was synthroid added for supplement. Swallow evaluation done and patient placed on dysphagia 3 diet with nectar liquids. Cognitive evaluation reveals patient at baseline.  She was started on Plavix for left paracentral stroke due to small vessel disease. Patient has had ongoing issues with abdominal pain and CT abdomen/pelvis 4/1 revealed hepatic steatosis and bilateral renal atrophy--no gross abnormality. She has had reactive leucocytosis with cough and fever due to bronchitis and was started on Levaquin for treatment..  Patient with resultant right sided weakness, foot drop   HPI 44 year old Accompanied by her mother, disabled daughter as well as interpreter.Patient went through inpatient rehabilitation at the hospital now is getting some home health therapy.  Has followed up with primary care physician on 02/12/2015. Also has followed up with nephrology and attends dialysis 3 times per week at an outpatient center [address]. No issues with home health Had a fall approximately 2 weeks ago on the right side. No serious injury but her right hand and right side of ribs have been sore since that time. Was seen by one of the dialysis providers, no x-rays ordered.    Pain Inventory Average Pain 5 Pain Right Now 10 - left hand My pain is intermittent, burning, dull, tingling and aching  In the last 24  hours, has pain interfered with the following? General activity 5 Relation with others 5 Enjoyment of life 5 What TIME of day is your pain at its worst? morning Sleep (in general) Poor  Pain is worse with: walking, sitting, standing and some activites Pain improves with: rest, heat/ice and medication Relief from Meds: 6  Mobility walk without assistance use a walker how many minutes can you walk? 2-3 ability to climb steps?  no do you drive?  no  Function disabled: date disabled .  Neuro/Psych weakness tingling trouble walking  Prior Studies Any changes since last visit?  no  Physicians involved in your care Any changes since last visit?  no   History reviewed. No pertinent family history. History   Social History  . Marital Status: Single    Spouse Name: N/A  . Number of Children: N/A  . Years of Education: N/A   Social History Main Topics  . Smoking status: Never Smoker   . Smokeless tobacco: Not on file  . Alcohol Use: No  . Drug Use: No  . Sexual Activity: Not on file   Other Topics Concern  . None   Social History Narrative   Originally [place],  Non English speaking   Past Surgical History  Procedure Laterality Date  . Insertion of dialysis catheter     Past Medical History  Diagnosis Date  . Hypertension   . Diabetes mellitus without complication   . Hyperlipidemia 01/24/2015  . ESRD on hemodialysis started 02/25/2013    [facility] [place] -   . Anemia of chronic disease   . Secondary hyperparathyroidism  BP 140/90 mmHg  Pulse 80  Resp 14  SpO2 97%  Opioid Risk Score:   Fall Risk Score: Low Fall Risk (0-5 points)`1  Depression screen PHQ 2/9  Depression screen PHQ 2/9 02/12/2015  Decreased Interest 0  Down, Depressed, Hopeless 0  PHQ - 2 Score 0     Review of Systems  Constitutional: Negative.   Eyes: Negative.   Respiratory: Negative.   Cardiovascular: Negative.   Gastrointestinal: Negative.   Endocrine: Negative.     Genitourinary: Negative.   Musculoskeletal:       Left side pain - arm and leg neuropathy  Skin: Negative.   Allergic/Immunologic: Negative.   Neurological: Positive for headaches.       Trouble walking, tingling  Hematological: Negative.   Psychiatric/Behavioral: Negative.        Objective:   Physical Exam  Constitutional: She is oriented to person, place, and time. She appears well-developed and well-nourished.  HENT:  Head: Normocephalic and atraumatic.  Eyes: Pupils are equal, round, and reactive to light.  Neurological: She is alert and oriented to person, place, and time.  Motor strength is 3/5 in the right deltoid, biceps, triceps, grip 4/5 in the right hip flexor and knee extensor ankle dorsiflexor and plantar flexor 5/5 in the left deltoid, biceps, triceps, grip 5/5 in the left  Hip flexor and extensor ankle dorsiflexor and plantar flexor Tone appears slightly increased in the flexors of the fingers and wrists on the right side only-Ashworth grade 1  Did not assess ambulation did not have assistive device with her, in wheelchair today  Psychiatric: She has a normal mood and affect.  Nursing note and vitals reviewed.  Minimal tenderness over the right lateral ribs, no evidence of ecchymosis  Right hand has mildly diminished flexion in the third MCP. 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 Overall she has made improvements in her functional status. Is able to live at home with family support. Would continue home health for now. Return to clinic one month for PM&R follow-up, Will likely need transition to outpatient therapy at that time. Transportation may be an issue  2. Fall Related to decreased balance from stroke. She does have a contused right third MCP joint, I do not see any signs of ecchymosis or  pain with active range of motion. She does have some pain at end range passively. No signs of deformity. As her pain is gradually subsiding and exam is negative for any significant joint deformity, I would hold off on x-ray for now. We'll reassess in 1 month. If pain increases or does not continue to improve would check x-ray  Patient also likely has right rib contusion no x-rays needed  May use Tylenol 1-2 tablets up to 3 times per day for pain, this seems to be helping

## 2015-03-15 NOTE — Progress Notes (Deleted)
   Subjective:    Patient ID: Natalie [name], female    DOB: [birth year], 44 y.o.   MRN: [id number]  HPI    Review of Systems     Objective:   Physical Exam        Assessment & Plan:

## 2015-03-24 ENCOUNTER — Encounter: Payer: Self-pay | Admitting: Family Medicine

## 2015-03-24 ENCOUNTER — Ambulatory Visit: Payer: Medicaid Other | Attending: Family Medicine | Admitting: Family Medicine

## 2015-03-24 VITALS — BP 109/71 | HR 75 | Temp 97.9°F | Resp 16 | Ht <= 58 in | Wt 124.0 lb

## 2015-03-24 DIAGNOSIS — E119 Type 2 diabetes mellitus without complications: Secondary | ICD-10-CM

## 2015-03-24 DIAGNOSIS — N186 End stage renal disease: Secondary | ICD-10-CM | POA: Diagnosis not present

## 2015-03-24 DIAGNOSIS — Z992 Dependence on renal dialysis: Secondary | ICD-10-CM | POA: Insufficient documentation

## 2015-03-24 DIAGNOSIS — E1129 Type 2 diabetes mellitus with other diabetic kidney complication: Secondary | ICD-10-CM | POA: Diagnosis not present

## 2015-03-24 DIAGNOSIS — E1121 Type 2 diabetes mellitus with diabetic nephropathy: Secondary | ICD-10-CM | POA: Insufficient documentation

## 2015-03-24 DIAGNOSIS — E1149 Type 2 diabetes mellitus with other diabetic neurological complication: Secondary | ICD-10-CM | POA: Diagnosis not present

## 2015-03-24 DIAGNOSIS — N058 Unspecified nephritic syndrome with other morphologic changes: Secondary | ICD-10-CM

## 2015-03-24 DIAGNOSIS — E1140 Type 2 diabetes mellitus with diabetic neuropathy, unspecified: Secondary | ICD-10-CM | POA: Insufficient documentation

## 2015-03-24 DIAGNOSIS — G40909 Epilepsy, unspecified, not intractable, without status epilepticus: Secondary | ICD-10-CM | POA: Insufficient documentation

## 2015-03-24 DIAGNOSIS — R569 Unspecified convulsions: Secondary | ICD-10-CM

## 2015-03-24 DIAGNOSIS — Z7902 Long term (current) use of antithrombotics/antiplatelets: Secondary | ICD-10-CM | POA: Insufficient documentation

## 2015-03-24 DIAGNOSIS — E039 Hypothyroidism, unspecified: Secondary | ICD-10-CM | POA: Insufficient documentation

## 2015-03-24 LAB — GLUCOSE, POCT (MANUAL RESULT ENTRY): POC GLUCOSE: 163 mg/dL — AB (ref 70–99)

## 2015-03-24 LAB — POCT GLYCOSYLATED HEMOGLOBIN (HGB A1C): HEMOGLOBIN A1C: 6.2

## 2015-03-24 NOTE — Progress Notes (Signed)
   Subjective:    Patient ID: Natalie [name], female    DOB: May 19, 1971, 44 y.o.   MRN: [id number] CC: f/u diabetes, seizure disorder, ESRD-DD TTS Spanish interpreter present  HPI 44 yo F presents with her sister and niece for f/u:  CHRONIC DIABETES with peripheral neuropathy   Disease Monitoring  Blood Sugar Ranges:  141 fasting   Polyuria: no   Visual problems: no   Medication Compliance: no medications   Medication Side Effects  Hypoglycemia: no   Preventitive Health Care  Eye Exam: due   Foot Exam: done today   Diet pattern: regular meals   Exercise: yes   2. Seizure disorder: taking keppra. No seizures. No fever. Eating well.   3. ESRD-DD: compliant with HD. Does not miss HD. No edema. No GI upset.   Soc Hx: non smoker  Review of Systems  Constitutional: Negative for fever and chills.  Respiratory: Negative for shortness of breath.   Cardiovascular: Negative for chest pain.  Musculoskeletal: Positive for myalgias.       Objective:   Physical Exam BP 109/71 mmHg  Pulse 75  Temp(Src) 97.9 F (36.6 C) (Oral)  Resp 16  Ht 4\' 8"  (1.422 m)  Wt 124 lb (56.246 kg)  BMI 27.82 kg/m2  SpO2 96%  LMP 11/23/2014 General appearance: alert, cooperative and no distress  Throat: upper dentures, one lower tooth, mucocele R lower  Back: symmetric, no curvature. ROM normal. No CVA tenderness. Lungs: clear to auscultation bilaterally Heart: regular rate and rhythm, S1, S2 normal, no murmur, click, rub or gallop; Ext: no edema   Lab Results  Component Value Date   HGBA1C 6.20 03/24/2015   CBG  163    Assessment & Plan:

## 2015-03-24 NOTE — Assessment & Plan Note (Signed)
A; persistent P: continue low dose gabapentin

## 2015-03-24 NOTE — Patient Instructions (Addendum)
[name],  1. Diabetes well controlled Check and log fasting sugars Diabetes blood sugar goals  Fasting (in AM before breakfast, 8 hrs of no eating or drinking (except water or unsweetened coffee or tea): 90-110 2 hrs after meals: < 160,   No low sugars: nothing < 70    2. Kidney disease:  On HD.   F/u in 4 weeks for pap smear  Dr. Armen Pickup   Recuento bsico de carbohidratos para la diabetes mellitus (Basic Carbohydrate Counting for Diabetes Mellitus) El recuento de carbohidratos es un mtodo destinado a calcular la cantidad de carbohidratos en la dieta. El consumo de carbohidratos aumenta naturalmente el nivel de azcar (glucosa) en la sangre, por lo que es importante que sepa la cantidad que debe incluir en cada comida. El recuento de carbohidratos ayuda a Futures trader de glucosa en la sangre dentro de los lmites normales. La cantidad permitida de carbohidratos es diferente para cada persona. Un nutricionista puede ayudarlo a calcular la cantidad adecuada para usted. Una vez que sepa la cantidad de carbohidratos que puede consumir, podr calcular los carbohidratos de los alimentos que desea comer. Los siguientes alimentos incluyen carbohidratos:  Granos, como panes y cereales.  Frijoles secos y productos con soja.  Vegetales almidonados, como papas, guisantes y maz.  Nils Pyle y jugos de frutas.  Leche y Dentist.  Dulces y bocadillos, como pastel, galletas, caramelos, papas fritas de bolsa, refrescos y bebidas frutales con azcar. RECUENTO DE CARBOHIDRATOS [employer] de calcular los carbohidratos de los alimentos. Puede usar cualquiera de [address] o [place] combinacin de [place]. Leer la etiqueta de informacin nutricional de los alimentos envasados La informacin nutricional es una etiqueta incluida en casi todas las bebidas y los alimentos envasados de los [place]. Indica el tamao de la porcin de ese alimento o bebida e informacin sobre los nutrientes  de cada porcin, incluso los gramos (g) de carbohidratos por porcin.  Decida la cantidad de porciones que comer o tomar de este alimento o bebida. Multiplique la cantidad de porciones por el nmero de gramos de carbohidratos indicados en la etiqueta para esa porcin. El total ser la cantidad de carbohidratos que consumir al comer ese alimento o tomar esa bebida. Conocer las porciones estndar de los alimentos Cuando coma alimentos no envasados o que no incluyan la informacin nutricional en la etiqueta, deber medir las porciones para poder calcular la cantidad de carbohidratos. Una porcin de la mayora de los alimentos ricos en carbohidratos contiene alrededor de 15g de carbohidratos. La siguiente [employer] tamaos de porcin de los alimentos ricos en carbohidratos que contienen alrededor de 15g de carbohidratos por porcin:   1rebanada de pan (1oz) o 1tortilla de seis pulgadas.  panecillo de hamburguesa o bollito tipo ingls.  4a 6galletas.   de taza de cereal sin azcar y seco.   taza de cereal caliente.   de taza de arroz o pastas.  taza de pur de papas o de una papa grande al horno.  1taza de frutas frescas o una fruta pequea.  taza de frutas o jugo de frutas enlatados o congelados.  1 taza [employer].   de taza de yogur descremado sin ningn agregado o de yogur endulzado con edulcorante artificial.  taza de vegetales almidonados, como guisantes, maz o papas, o de frijoles secos cocidos. Decida la cantidad de porciones Advertising copywriter. Multiplique la cantidad de porciones por 15 (los gramos de carbohidratos en esa porcin). Por ejemplo, si come 2tazas de  fresas, habr comido 2porciones y 30g de carbohidratos (2porciones x 15g = 30g). Para las comidas como sopas y guisos, en las que se mezcla ms de un alimento, deber Sun[employer] Northern Santa Fecontar los carbohidratos de cada alimento incluido. EJEMPLO DE RECUENTO DE CARBOHIDRATOS Ejemplo de cena  3 onzas de  pechugas de pollo.   de taza de arroz integral.   taza de maz.  1 taza de [place].  1 taza de fresas con crema batida sin azcar. Clculo de carbohidratos [place] 1: Identifique los alimentos que contienen carbohidratos:   Arroz.  Maz.  Leche.  Jinny SandersFresas. [place] 2: Calcule el nmero de porciones que consumir de cada uno:   2 porciones de Surveyor, mineralsarroz.  1 porcin de maz.  1 porcin de leche.  1 porcin de fresas. [place] 3: Multiplique cada una de esas porciones por 15g:   2 porciones de arroz x 15 g = 30 g.  1 porcin de maz x 15 g = 15 g.  1 porcin de leche x 15 g = 15 g.  1 porcin de fresas x 15 g = 15 g. [place] 4: Sume todas las cantidades para Artistconocer el total de gramos de carbohidratos consumidos: 30 g + 15 g + 15 g + 15 g = 75 g. Document Released: 01/08/2012 Document Revised: 03/02/2014 [facility] [employer] Patient Information 2015 [place], [place]. This information is not intended to replace advice given to you by your health care provider. Make sure you discuss any questions you have with your health care provider.

## 2015-03-24 NOTE — Progress Notes (Signed)
Establish Care F/U DM Sz Stated taking medication as proscribed on chart  No recollection of medication no list

## 2015-03-24 NOTE — Assessment & Plan Note (Signed)
1. Diabetes well controlled Check and log fasting sugars Diabetes blood sugar goals  Fasting (in AM before breakfast, 8 hrs of no eating or drinking (except water or unsweetened coffee or tea): 90-110 2 hrs after meals: < 160,   No low sugars: nothing < 70

## 2015-03-24 NOTE — Assessment & Plan Note (Signed)
A: stable on keppra. No seizures P: continue keppra

## 2015-03-25 LAB — MICROALBUMIN, URINE: MICROALB UR: 86.6 mg/dL — AB (ref <2.0)

## 2015-04-05 ENCOUNTER — Telehealth: Payer: Self-pay | Admitting: *Deleted

## 2015-04-05 NOTE — Telephone Encounter (Signed)
-----   Message from Dessa PhiJosalyn Funches, MD sent at 03/30/2015  9:27 AM EDT ----- Elevated urine microalbuminuria patient with known kidney disease on HD. No change in care.

## 2015-04-05 NOTE — Telephone Encounter (Signed)
Pt aware of results Results given in Spanish 

## 2015-04-06 ENCOUNTER — Telehealth: Payer: Self-pay | Admitting: Family Medicine

## 2015-04-06 NOTE — Telephone Encounter (Signed)
Patient's daughter called requesting to speak to nurse regarding mother's results. She would like to know mother's results. Please f/u

## 2015-04-07 NOTE — Telephone Encounter (Signed)
Spanish interpreter ID # [id number]  Called back to patient to give lab results.  Patient not available. Spanish interpreter left message asking for patient to call back. When patient calls please inform her that there was stable elevation of protein in urine and since patient is on dialysis no further testing or treatment needs to be done.

## 2015-04-14 ENCOUNTER — Other Ambulatory Visit: Payer: Self-pay | Admitting: Family Medicine

## 2015-04-14 DIAGNOSIS — E039 Hypothyroidism, unspecified: Secondary | ICD-10-CM

## 2015-04-14 MED ORDER — LEVOTHYROXINE SODIUM 25 MCG PO TABS: 25.0000 ug | ORAL_TABLET | Freq: Every day | ORAL | Status: AC

## 2015-04-14 NOTE — Telephone Encounter (Signed)
Levothyroxine refilled Patient will also need TSH check, ordered

## 2015-04-14 NOTE — Telephone Encounter (Signed)
Pt came into office requesting medication refill for levothyroxine (SYNTHROID, LEVOTHROID) 25 MCG tablet, Patient uses [facility] pharmacy.Please f/u with patient

## 2015-04-14 NOTE — Telephone Encounter (Signed)
Left message with female to return call 

## 2015-04-19 ENCOUNTER — Ambulatory Visit: Payer: Self-pay | Admitting: Physical Medicine & Rehabilitation

## 2015-04-19 ENCOUNTER — Encounter: Payer: Self-pay | Attending: Physical Medicine & Rehabilitation

## 2015-04-19 DIAGNOSIS — I639 Cerebral infarction, unspecified: Secondary | ICD-10-CM | POA: Insufficient documentation

## 2015-04-19 DIAGNOSIS — N186 End stage renal disease: Secondary | ICD-10-CM | POA: Insufficient documentation

## 2015-04-19 DIAGNOSIS — G8190 Hemiplegia, unspecified affecting unspecified side: Secondary | ICD-10-CM | POA: Insufficient documentation

## 2015-05-14 ENCOUNTER — Encounter: Payer: Self-pay | Admitting: Family Medicine

## 2016-08-23 IMAGING — CT CT HEAD W/O CM
2 series · 15 of 30 positions shown, 19 images · non-contrast
Comparison: MR brain 01/23/2015 demonstrated an acute brainstem
infarct.

CLINICAL DATA: Generalized weakness. Diabetes mellitus. Syncopal
episode during hemodialysis. Subsequent encounter.

EXAM:
CT HEAD WITHOUT CONTRAST
TECHNIQUE: Contiguous axial images were obtained from the base of the skull
through the vertex without intravenous contrast.

[Series 201: head w/o, idose (1) · axial · non-contrast · 0.41mm/px · z∈[+102,+222]mm · 13 of 29 slices shown, 17 images]
[im 3/29  brain]
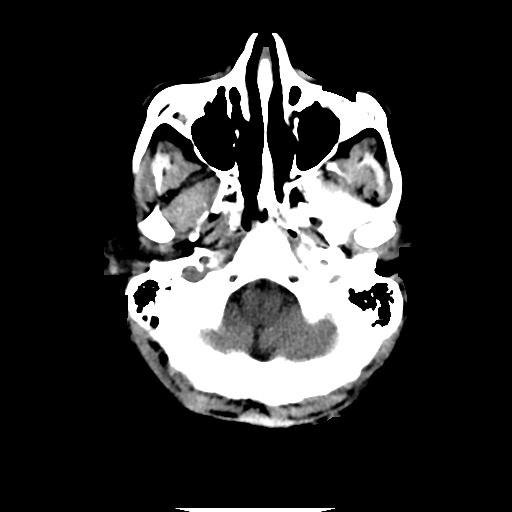
[im 3/29  bone]
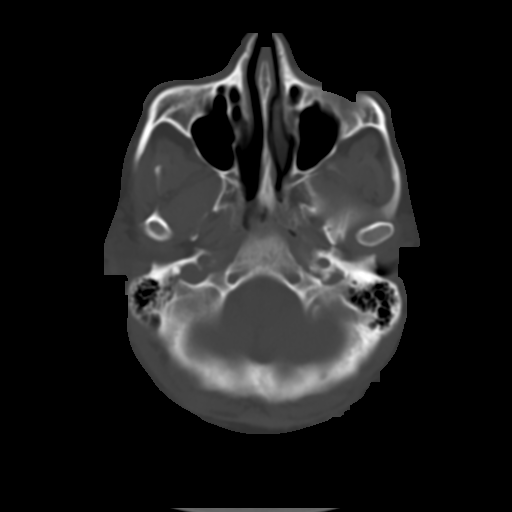
[im 5/29  brain]
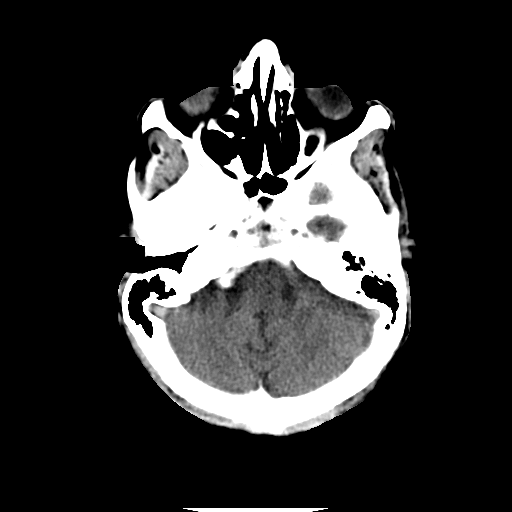
[im 7/29  brain]
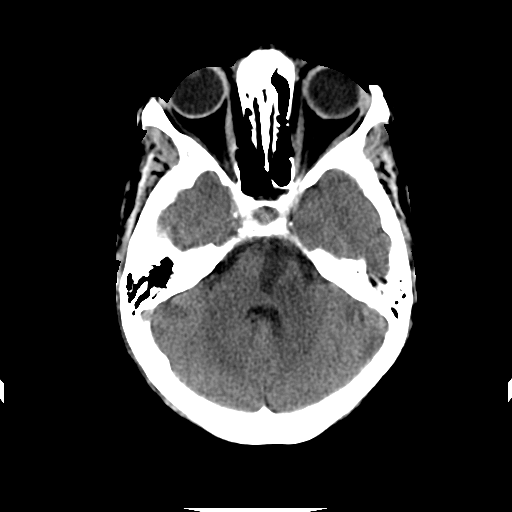
[im 9/29  brain]
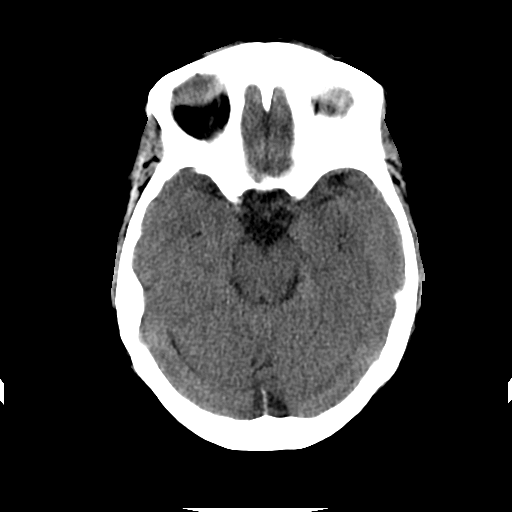
[im 11/29  brain]
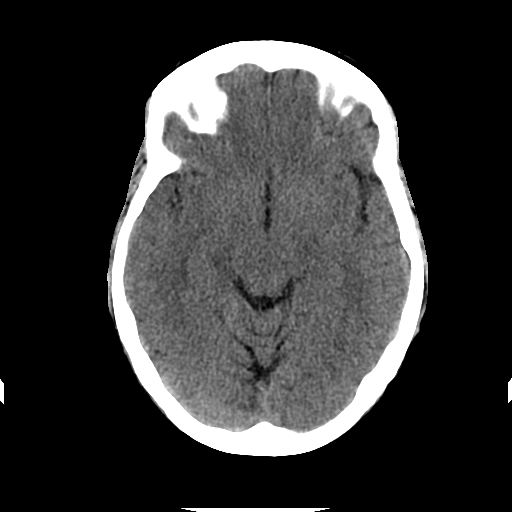
[im 11/29  bone]
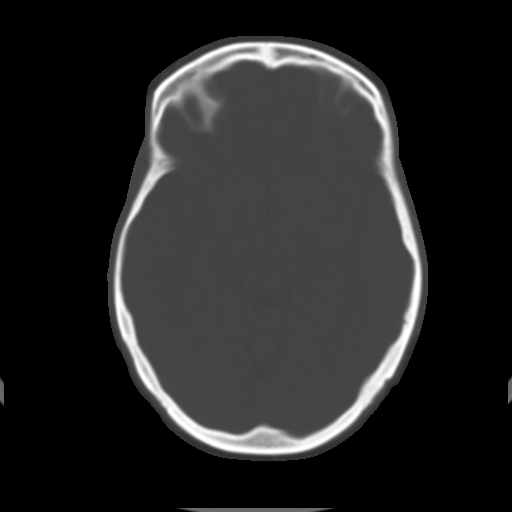
[im 13/29  brain]
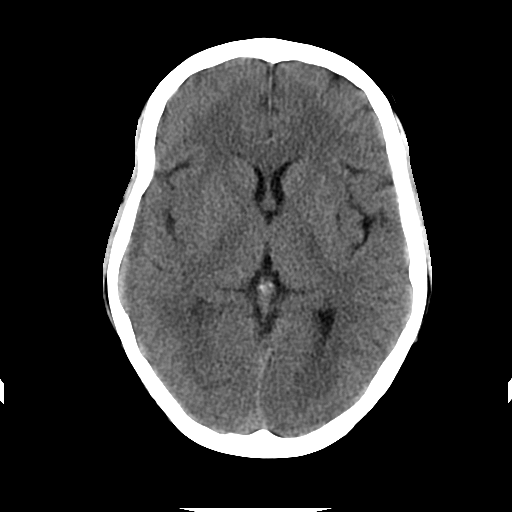
[im 15/29  brain]
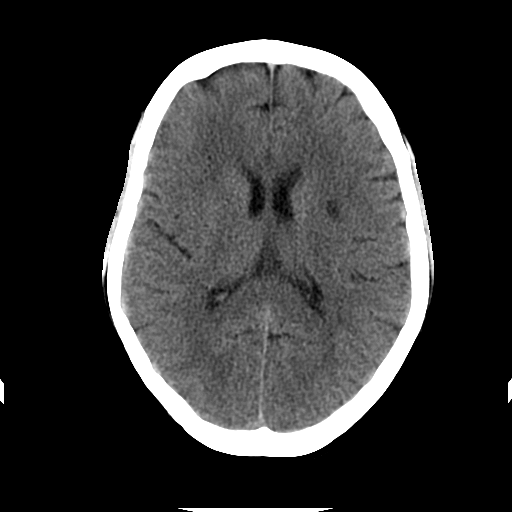
[im 17/29  brain]
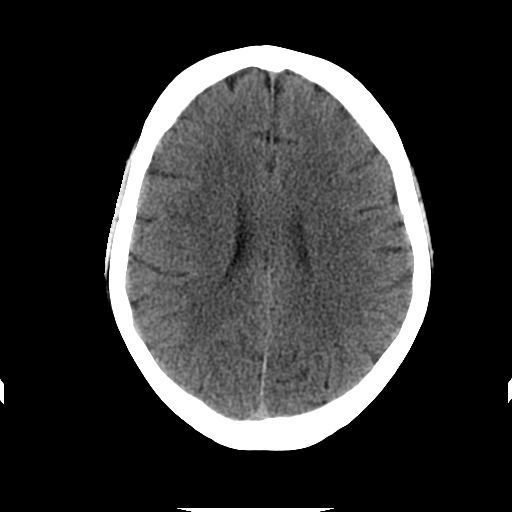
[im 19/29  brain]
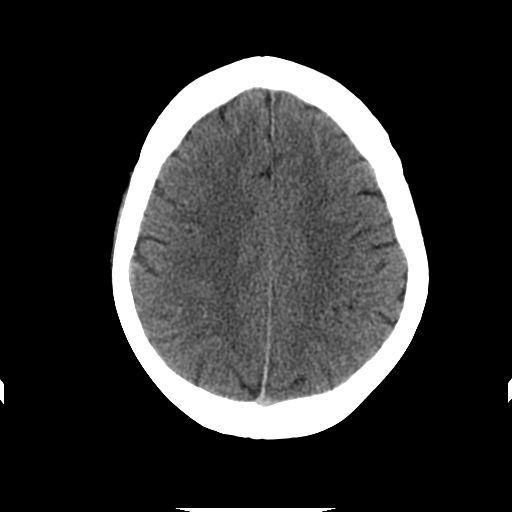
[im 19/29  bone]
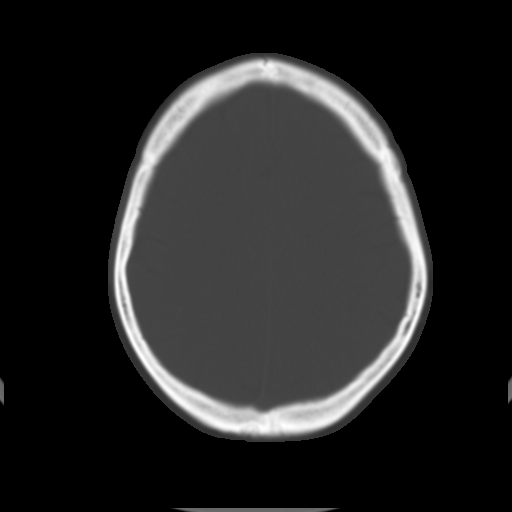
[im 21/29  brain]
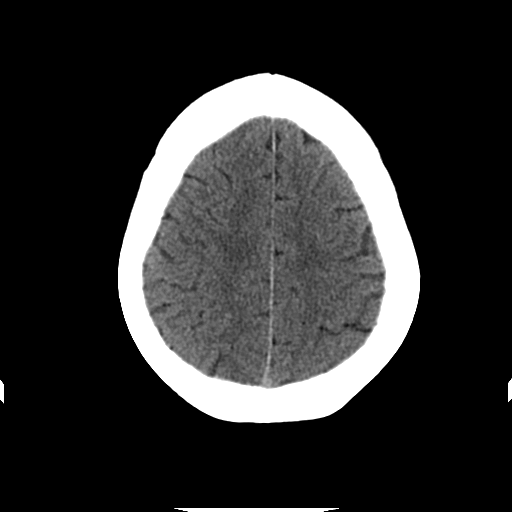
[im 23/29  brain]
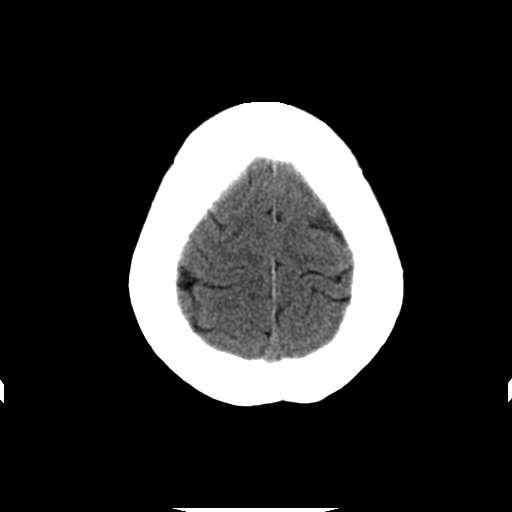
[im 25/29  brain]
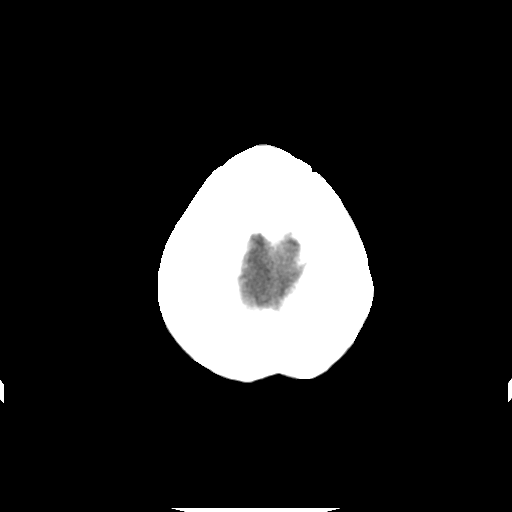
[im 27/29  brain]
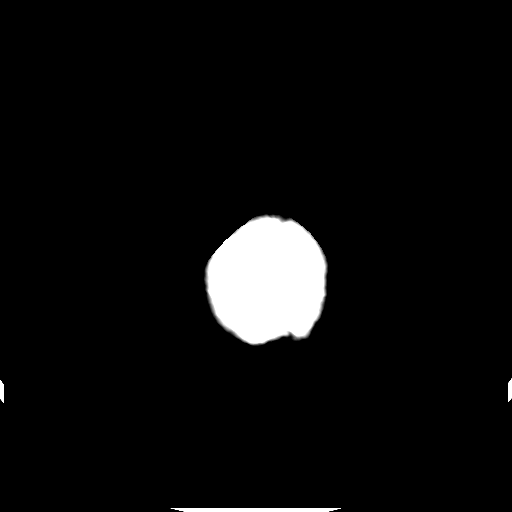
[im 27/29  bone]
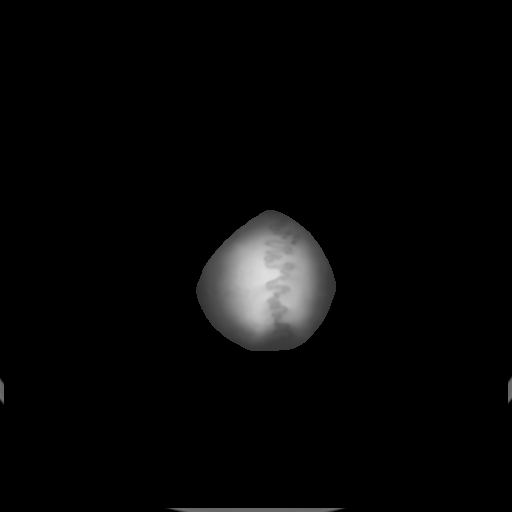

[Series 202: head w/o bone, idose (1) · axial · non-contrast · 0.41mm/px · z∈[+102,+122]mm · 2 of 29 slices shown]
[im 3/29  bone]
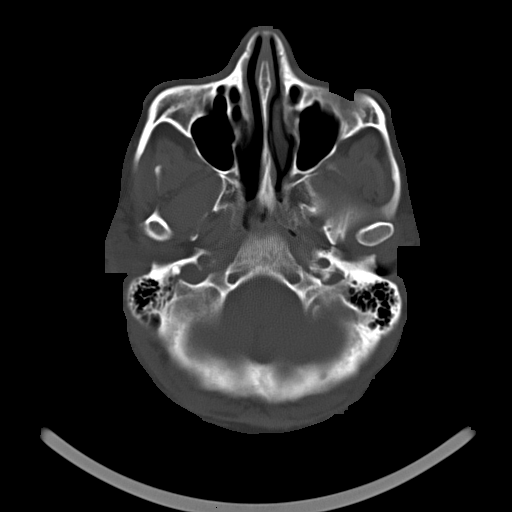
[im 7/29  bone]
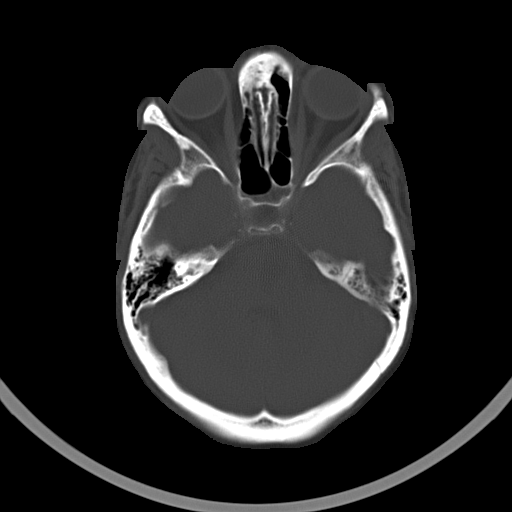

[15 of 30 positions shown; findings below may reference images not displayed]

FINDINGS: Aside from intense cytotoxic edema in the LEFT paramedian pontine
infarct, no new acute infarction is demonstrated. There is a chronic
deep white matter infarct in the subinsular region on the LEFT.
Similar small chronic lacune RIGHT pons.

No hemorrhage, mass lesion, hydrocephalus, or extra-axial fluid.

Normal for age cerebral volume. No appreciable hypoattenuation white
matter.

Calvarium intact.  No sinus or mastoid disease.

Compared with prior MR, when technique differences are considered,
the overall distribution of the late acute to early subacute LEFT
brainstem infarct appears similar.
IMPRESSION: Increasingly well-demarcated LEFT paramedian pontine infarct, late
acute to early subacute time course. No hemorrhagic transformation
or new areas of infarction are identified.
